# Patient Record
Sex: Female | Born: 1935 | Race: White | Hispanic: No | Marital: Married | Smoking: Former smoker
Health system: Southern US, Community
[De-identification: ages and names within clinical notes are randomized; demographics above are authoritative.]

## PROBLEM LIST (undated history)

## (undated) DIAGNOSIS — I10 Essential (primary) hypertension: Secondary | ICD-10-CM

## (undated) DIAGNOSIS — D649 Anemia, unspecified: Secondary | ICD-10-CM

## (undated) DIAGNOSIS — F0390 Unspecified dementia without behavioral disturbance: Secondary | ICD-10-CM

## (undated) DIAGNOSIS — E785 Hyperlipidemia, unspecified: Secondary | ICD-10-CM

## (undated) DIAGNOSIS — O2230 Deep phlebothrombosis in pregnancy, unspecified trimester: Secondary | ICD-10-CM

## (undated) HISTORY — DX: Anemia, unspecified: D64.9

## (undated) HISTORY — DX: Essential (primary) hypertension: I10

## (undated) HISTORY — DX: Hyperlipidemia, unspecified: E78.5

## (undated) HISTORY — PX: COLONOSCOPY: SHX174

---

## 1979-02-16 HISTORY — PX: ANKLE SURGERY: SHX546

## 1985-02-15 HISTORY — PX: BACK SURGERY: SHX140

## 2003-12-24 ENCOUNTER — Ambulatory Visit: Payer: Self-pay | Admitting: Family Medicine

## 2004-12-28 ENCOUNTER — Ambulatory Visit: Payer: Self-pay | Admitting: Family Medicine

## 2005-03-26 ENCOUNTER — Ambulatory Visit: Payer: Self-pay | Admitting: Unknown Physician Specialty

## 2005-07-29 ENCOUNTER — Ambulatory Visit: Payer: Self-pay | Admitting: Family Medicine

## 2005-08-05 ENCOUNTER — Ambulatory Visit: Payer: Self-pay | Admitting: Family Medicine

## 2005-08-17 ENCOUNTER — Ambulatory Visit: Payer: Self-pay | Admitting: Surgery

## 2006-03-01 ENCOUNTER — Ambulatory Visit: Payer: Self-pay | Admitting: Surgery

## 2006-08-22 ENCOUNTER — Ambulatory Visit: Payer: Self-pay | Admitting: Unknown Physician Specialty

## 2006-08-25 ENCOUNTER — Ambulatory Visit: Payer: Self-pay

## 2007-05-20 ENCOUNTER — Emergency Department: Payer: Self-pay | Admitting: Unknown Physician Specialty

## 2007-05-31 ENCOUNTER — Ambulatory Visit: Payer: Self-pay | Admitting: Family Medicine

## 2007-07-10 ENCOUNTER — Ambulatory Visit: Payer: Self-pay | Admitting: Family Medicine

## 2007-07-26 ENCOUNTER — Ambulatory Visit: Payer: Self-pay | Admitting: Gastroenterology

## 2007-07-28 ENCOUNTER — Ambulatory Visit: Payer: Self-pay

## 2008-06-04 ENCOUNTER — Ambulatory Visit: Payer: Self-pay | Admitting: Family Medicine

## 2008-07-09 ENCOUNTER — Ambulatory Visit: Payer: Self-pay | Admitting: Unknown Physician Specialty

## 2009-06-18 ENCOUNTER — Ambulatory Visit: Payer: Self-pay | Admitting: Ophthalmology

## 2009-06-30 ENCOUNTER — Ambulatory Visit: Payer: Self-pay | Admitting: Unknown Physician Specialty

## 2010-10-31 ENCOUNTER — Ambulatory Visit: Payer: Self-pay | Admitting: Internal Medicine

## 2011-03-08 DIAGNOSIS — D510 Vitamin B12 deficiency anemia due to intrinsic factor deficiency: Secondary | ICD-10-CM | POA: Diagnosis not present

## 2011-04-09 DIAGNOSIS — D510 Vitamin B12 deficiency anemia due to intrinsic factor deficiency: Secondary | ICD-10-CM | POA: Diagnosis not present

## 2011-04-26 DIAGNOSIS — H2510 Age-related nuclear cataract, unspecified eye: Secondary | ICD-10-CM | POA: Diagnosis not present

## 2011-04-26 DIAGNOSIS — Z961 Presence of intraocular lens: Secondary | ICD-10-CM | POA: Diagnosis not present

## 2011-05-10 DIAGNOSIS — D510 Vitamin B12 deficiency anemia due to intrinsic factor deficiency: Secondary | ICD-10-CM | POA: Diagnosis not present

## 2011-06-10 DIAGNOSIS — D510 Vitamin B12 deficiency anemia due to intrinsic factor deficiency: Secondary | ICD-10-CM | POA: Diagnosis not present

## 2011-07-08 DIAGNOSIS — R7309 Other abnormal glucose: Secondary | ICD-10-CM | POA: Diagnosis not present

## 2011-07-08 DIAGNOSIS — E785 Hyperlipidemia, unspecified: Secondary | ICD-10-CM | POA: Diagnosis not present

## 2011-07-08 DIAGNOSIS — I10 Essential (primary) hypertension: Secondary | ICD-10-CM | POA: Diagnosis not present

## 2011-07-08 DIAGNOSIS — D510 Vitamin B12 deficiency anemia due to intrinsic factor deficiency: Secondary | ICD-10-CM | POA: Diagnosis not present

## 2011-07-09 DIAGNOSIS — D649 Anemia, unspecified: Secondary | ICD-10-CM | POA: Diagnosis not present

## 2011-07-09 DIAGNOSIS — M810 Age-related osteoporosis without current pathological fracture: Secondary | ICD-10-CM | POA: Diagnosis not present

## 2011-07-09 DIAGNOSIS — D509 Iron deficiency anemia, unspecified: Secondary | ICD-10-CM | POA: Diagnosis not present

## 2011-07-09 DIAGNOSIS — E538 Deficiency of other specified B group vitamins: Secondary | ICD-10-CM | POA: Diagnosis not present

## 2011-07-09 DIAGNOSIS — R7309 Other abnormal glucose: Secondary | ICD-10-CM | POA: Diagnosis not present

## 2011-07-09 DIAGNOSIS — E785 Hyperlipidemia, unspecified: Secondary | ICD-10-CM | POA: Diagnosis not present

## 2011-08-11 DIAGNOSIS — D510 Vitamin B12 deficiency anemia due to intrinsic factor deficiency: Secondary | ICD-10-CM | POA: Diagnosis not present

## 2011-08-11 DIAGNOSIS — E785 Hyperlipidemia, unspecified: Secondary | ICD-10-CM | POA: Diagnosis not present

## 2011-08-11 DIAGNOSIS — R7309 Other abnormal glucose: Secondary | ICD-10-CM | POA: Diagnosis not present

## 2011-08-11 DIAGNOSIS — E538 Deficiency of other specified B group vitamins: Secondary | ICD-10-CM | POA: Diagnosis not present

## 2011-09-08 DIAGNOSIS — R7309 Other abnormal glucose: Secondary | ICD-10-CM | POA: Diagnosis not present

## 2011-09-08 DIAGNOSIS — D510 Vitamin B12 deficiency anemia due to intrinsic factor deficiency: Secondary | ICD-10-CM | POA: Diagnosis not present

## 2011-09-08 DIAGNOSIS — E538 Deficiency of other specified B group vitamins: Secondary | ICD-10-CM | POA: Diagnosis not present

## 2011-09-08 DIAGNOSIS — E785 Hyperlipidemia, unspecified: Secondary | ICD-10-CM | POA: Diagnosis not present

## 2011-09-24 DIAGNOSIS — R079 Chest pain, unspecified: Secondary | ICD-10-CM | POA: Diagnosis not present

## 2011-09-24 DIAGNOSIS — I10 Essential (primary) hypertension: Secondary | ICD-10-CM | POA: Diagnosis not present

## 2011-10-14 DIAGNOSIS — E538 Deficiency of other specified B group vitamins: Secondary | ICD-10-CM | POA: Diagnosis not present

## 2011-10-14 DIAGNOSIS — D510 Vitamin B12 deficiency anemia due to intrinsic factor deficiency: Secondary | ICD-10-CM | POA: Diagnosis not present

## 2011-10-14 DIAGNOSIS — E785 Hyperlipidemia, unspecified: Secondary | ICD-10-CM | POA: Diagnosis not present

## 2011-10-14 DIAGNOSIS — R7309 Other abnormal glucose: Secondary | ICD-10-CM | POA: Diagnosis not present

## 2011-11-10 DIAGNOSIS — E785 Hyperlipidemia, unspecified: Secondary | ICD-10-CM | POA: Diagnosis not present

## 2011-11-10 DIAGNOSIS — R7309 Other abnormal glucose: Secondary | ICD-10-CM | POA: Diagnosis not present

## 2011-11-10 DIAGNOSIS — Z23 Encounter for immunization: Secondary | ICD-10-CM | POA: Diagnosis not present

## 2011-11-10 DIAGNOSIS — E538 Deficiency of other specified B group vitamins: Secondary | ICD-10-CM | POA: Diagnosis not present

## 2011-11-10 DIAGNOSIS — D510 Vitamin B12 deficiency anemia due to intrinsic factor deficiency: Secondary | ICD-10-CM | POA: Diagnosis not present

## 2011-11-24 DIAGNOSIS — M779 Enthesopathy, unspecified: Secondary | ICD-10-CM | POA: Diagnosis not present

## 2011-12-09 DIAGNOSIS — D510 Vitamin B12 deficiency anemia due to intrinsic factor deficiency: Secondary | ICD-10-CM | POA: Diagnosis not present

## 2011-12-09 DIAGNOSIS — E538 Deficiency of other specified B group vitamins: Secondary | ICD-10-CM | POA: Diagnosis not present

## 2011-12-09 DIAGNOSIS — Z23 Encounter for immunization: Secondary | ICD-10-CM | POA: Diagnosis not present

## 2011-12-22 DIAGNOSIS — I10 Essential (primary) hypertension: Secondary | ICD-10-CM | POA: Diagnosis not present

## 2011-12-22 DIAGNOSIS — Z23 Encounter for immunization: Secondary | ICD-10-CM | POA: Diagnosis not present

## 2011-12-22 DIAGNOSIS — R7309 Other abnormal glucose: Secondary | ICD-10-CM | POA: Diagnosis not present

## 2011-12-22 DIAGNOSIS — M109 Gout, unspecified: Secondary | ICD-10-CM | POA: Diagnosis not present

## 2012-01-06 DIAGNOSIS — Z23 Encounter for immunization: Secondary | ICD-10-CM | POA: Diagnosis not present

## 2012-01-06 DIAGNOSIS — I10 Essential (primary) hypertension: Secondary | ICD-10-CM | POA: Diagnosis not present

## 2012-01-06 DIAGNOSIS — D518 Other vitamin B12 deficiency anemias: Secondary | ICD-10-CM | POA: Diagnosis not present

## 2012-01-06 DIAGNOSIS — M109 Gout, unspecified: Secondary | ICD-10-CM | POA: Diagnosis not present

## 2012-02-03 DIAGNOSIS — Z23 Encounter for immunization: Secondary | ICD-10-CM | POA: Diagnosis not present

## 2012-02-03 DIAGNOSIS — D518 Other vitamin B12 deficiency anemias: Secondary | ICD-10-CM | POA: Diagnosis not present

## 2012-02-03 DIAGNOSIS — M109 Gout, unspecified: Secondary | ICD-10-CM | POA: Diagnosis not present

## 2012-02-03 DIAGNOSIS — I10 Essential (primary) hypertension: Secondary | ICD-10-CM | POA: Diagnosis not present

## 2012-03-02 DIAGNOSIS — I10 Essential (primary) hypertension: Secondary | ICD-10-CM | POA: Diagnosis not present

## 2012-03-02 DIAGNOSIS — Z23 Encounter for immunization: Secondary | ICD-10-CM | POA: Diagnosis not present

## 2012-03-02 DIAGNOSIS — D518 Other vitamin B12 deficiency anemias: Secondary | ICD-10-CM | POA: Diagnosis not present

## 2012-03-02 DIAGNOSIS — M109 Gout, unspecified: Secondary | ICD-10-CM | POA: Diagnosis not present

## 2012-03-28 DIAGNOSIS — D518 Other vitamin B12 deficiency anemias: Secondary | ICD-10-CM | POA: Diagnosis not present

## 2012-03-28 DIAGNOSIS — M109 Gout, unspecified: Secondary | ICD-10-CM | POA: Diagnosis not present

## 2012-03-28 DIAGNOSIS — Z23 Encounter for immunization: Secondary | ICD-10-CM | POA: Diagnosis not present

## 2012-03-28 DIAGNOSIS — I10 Essential (primary) hypertension: Secondary | ICD-10-CM | POA: Diagnosis not present

## 2012-04-01 DIAGNOSIS — M109 Gout, unspecified: Secondary | ICD-10-CM | POA: Diagnosis not present

## 2012-04-01 DIAGNOSIS — Z23 Encounter for immunization: Secondary | ICD-10-CM | POA: Diagnosis not present

## 2012-04-01 DIAGNOSIS — I10 Essential (primary) hypertension: Secondary | ICD-10-CM | POA: Diagnosis not present

## 2012-04-01 DIAGNOSIS — J209 Acute bronchitis, unspecified: Secondary | ICD-10-CM | POA: Diagnosis not present

## 2012-04-11 ENCOUNTER — Ambulatory Visit: Payer: Self-pay | Admitting: Family Medicine

## 2012-04-11 DIAGNOSIS — J40 Bronchitis, not specified as acute or chronic: Secondary | ICD-10-CM | POA: Diagnosis not present

## 2012-04-11 DIAGNOSIS — Z23 Encounter for immunization: Secondary | ICD-10-CM | POA: Diagnosis not present

## 2012-04-11 DIAGNOSIS — R911 Solitary pulmonary nodule: Secondary | ICD-10-CM | POA: Diagnosis not present

## 2012-04-11 DIAGNOSIS — R059 Cough, unspecified: Secondary | ICD-10-CM | POA: Diagnosis not present

## 2012-04-11 DIAGNOSIS — R071 Chest pain on breathing: Secondary | ICD-10-CM | POA: Diagnosis not present

## 2012-04-11 DIAGNOSIS — I10 Essential (primary) hypertension: Secondary | ICD-10-CM | POA: Diagnosis not present

## 2012-04-14 DIAGNOSIS — D649 Anemia, unspecified: Secondary | ICD-10-CM | POA: Diagnosis not present

## 2012-04-14 DIAGNOSIS — D72829 Elevated white blood cell count, unspecified: Secondary | ICD-10-CM | POA: Diagnosis not present

## 2012-04-14 DIAGNOSIS — Z1212 Encounter for screening for malignant neoplasm of rectum: Secondary | ICD-10-CM | POA: Diagnosis not present

## 2012-04-14 DIAGNOSIS — I10 Essential (primary) hypertension: Secondary | ICD-10-CM | POA: Diagnosis not present

## 2012-04-14 DIAGNOSIS — Z23 Encounter for immunization: Secondary | ICD-10-CM | POA: Diagnosis not present

## 2012-04-19 DIAGNOSIS — I10 Essential (primary) hypertension: Secondary | ICD-10-CM | POA: Diagnosis not present

## 2012-04-25 DIAGNOSIS — D72829 Elevated white blood cell count, unspecified: Secondary | ICD-10-CM | POA: Diagnosis not present

## 2012-04-25 DIAGNOSIS — I10 Essential (primary) hypertension: Secondary | ICD-10-CM | POA: Diagnosis not present

## 2012-04-25 DIAGNOSIS — D518 Other vitamin B12 deficiency anemias: Secondary | ICD-10-CM | POA: Diagnosis not present

## 2012-04-25 DIAGNOSIS — Z1212 Encounter for screening for malignant neoplasm of rectum: Secondary | ICD-10-CM | POA: Diagnosis not present

## 2012-04-26 DIAGNOSIS — R5381 Other malaise: Secondary | ICD-10-CM | POA: Diagnosis not present

## 2012-04-26 DIAGNOSIS — D518 Other vitamin B12 deficiency anemias: Secondary | ICD-10-CM | POA: Diagnosis not present

## 2012-04-26 DIAGNOSIS — D649 Anemia, unspecified: Secondary | ICD-10-CM | POA: Diagnosis not present

## 2012-04-26 DIAGNOSIS — R5383 Other fatigue: Secondary | ICD-10-CM | POA: Diagnosis not present

## 2012-04-26 DIAGNOSIS — R0602 Shortness of breath: Secondary | ICD-10-CM | POA: Diagnosis not present

## 2012-04-26 DIAGNOSIS — I10 Essential (primary) hypertension: Secondary | ICD-10-CM | POA: Diagnosis not present

## 2012-05-05 DIAGNOSIS — I10 Essential (primary) hypertension: Secondary | ICD-10-CM | POA: Diagnosis not present

## 2012-05-05 DIAGNOSIS — R5381 Other malaise: Secondary | ICD-10-CM | POA: Diagnosis not present

## 2012-05-05 DIAGNOSIS — D649 Anemia, unspecified: Secondary | ICD-10-CM | POA: Diagnosis not present

## 2012-05-05 DIAGNOSIS — D518 Other vitamin B12 deficiency anemias: Secondary | ICD-10-CM | POA: Diagnosis not present

## 2012-05-23 DIAGNOSIS — I10 Essential (primary) hypertension: Secondary | ICD-10-CM | POA: Diagnosis not present

## 2012-05-23 DIAGNOSIS — D518 Other vitamin B12 deficiency anemias: Secondary | ICD-10-CM | POA: Diagnosis not present

## 2012-05-23 DIAGNOSIS — D649 Anemia, unspecified: Secondary | ICD-10-CM | POA: Diagnosis not present

## 2012-05-23 DIAGNOSIS — R5383 Other fatigue: Secondary | ICD-10-CM | POA: Diagnosis not present

## 2012-06-08 DIAGNOSIS — H3530 Unspecified macular degeneration: Secondary | ICD-10-CM | POA: Diagnosis not present

## 2012-06-20 DIAGNOSIS — D649 Anemia, unspecified: Secondary | ICD-10-CM | POA: Diagnosis not present

## 2012-06-20 DIAGNOSIS — I10 Essential (primary) hypertension: Secondary | ICD-10-CM | POA: Diagnosis not present

## 2012-06-20 DIAGNOSIS — R5381 Other malaise: Secondary | ICD-10-CM | POA: Diagnosis not present

## 2012-06-20 DIAGNOSIS — D518 Other vitamin B12 deficiency anemias: Secondary | ICD-10-CM | POA: Diagnosis not present

## 2012-07-12 DIAGNOSIS — M7750 Other enthesopathy of unspecified foot: Secondary | ICD-10-CM | POA: Diagnosis not present

## 2012-07-18 DIAGNOSIS — R5381 Other malaise: Secondary | ICD-10-CM | POA: Diagnosis not present

## 2012-07-18 DIAGNOSIS — D518 Other vitamin B12 deficiency anemias: Secondary | ICD-10-CM | POA: Diagnosis not present

## 2012-07-18 DIAGNOSIS — R5383 Other fatigue: Secondary | ICD-10-CM | POA: Diagnosis not present

## 2012-07-18 DIAGNOSIS — I10 Essential (primary) hypertension: Secondary | ICD-10-CM | POA: Diagnosis not present

## 2012-08-09 DIAGNOSIS — R5383 Other fatigue: Secondary | ICD-10-CM | POA: Diagnosis not present

## 2012-08-09 DIAGNOSIS — R5381 Other malaise: Secondary | ICD-10-CM | POA: Diagnosis not present

## 2012-08-09 DIAGNOSIS — D649 Anemia, unspecified: Secondary | ICD-10-CM | POA: Diagnosis not present

## 2012-08-09 DIAGNOSIS — E785 Hyperlipidemia, unspecified: Secondary | ICD-10-CM | POA: Diagnosis not present

## 2012-08-09 DIAGNOSIS — E7800 Pure hypercholesterolemia, unspecified: Secondary | ICD-10-CM | POA: Diagnosis not present

## 2012-08-09 DIAGNOSIS — R7309 Other abnormal glucose: Secondary | ICD-10-CM | POA: Diagnosis not present

## 2012-08-09 DIAGNOSIS — K219 Gastro-esophageal reflux disease without esophagitis: Secondary | ICD-10-CM | POA: Diagnosis not present

## 2012-09-07 DIAGNOSIS — D518 Other vitamin B12 deficiency anemias: Secondary | ICD-10-CM | POA: Diagnosis not present

## 2012-09-07 DIAGNOSIS — E7800 Pure hypercholesterolemia, unspecified: Secondary | ICD-10-CM | POA: Diagnosis not present

## 2012-09-07 DIAGNOSIS — R7309 Other abnormal glucose: Secondary | ICD-10-CM | POA: Diagnosis not present

## 2012-09-07 DIAGNOSIS — K219 Gastro-esophageal reflux disease without esophagitis: Secondary | ICD-10-CM | POA: Diagnosis not present

## 2012-09-18 DIAGNOSIS — E782 Mixed hyperlipidemia: Secondary | ICD-10-CM | POA: Diagnosis not present

## 2012-09-18 DIAGNOSIS — I2510 Atherosclerotic heart disease of native coronary artery without angina pectoris: Secondary | ICD-10-CM | POA: Diagnosis not present

## 2012-10-05 DIAGNOSIS — K219 Gastro-esophageal reflux disease without esophagitis: Secondary | ICD-10-CM | POA: Diagnosis not present

## 2012-10-05 DIAGNOSIS — R7309 Other abnormal glucose: Secondary | ICD-10-CM | POA: Diagnosis not present

## 2012-10-05 DIAGNOSIS — E538 Deficiency of other specified B group vitamins: Secondary | ICD-10-CM | POA: Diagnosis not present

## 2012-10-05 DIAGNOSIS — E7800 Pure hypercholesterolemia, unspecified: Secondary | ICD-10-CM | POA: Diagnosis not present

## 2012-11-02 DIAGNOSIS — E538 Deficiency of other specified B group vitamins: Secondary | ICD-10-CM | POA: Diagnosis not present

## 2012-11-02 DIAGNOSIS — E7800 Pure hypercholesterolemia, unspecified: Secondary | ICD-10-CM | POA: Diagnosis not present

## 2012-11-02 DIAGNOSIS — R7309 Other abnormal glucose: Secondary | ICD-10-CM | POA: Diagnosis not present

## 2012-11-02 DIAGNOSIS — K219 Gastro-esophageal reflux disease without esophagitis: Secondary | ICD-10-CM | POA: Diagnosis not present

## 2012-12-06 DIAGNOSIS — E538 Deficiency of other specified B group vitamins: Secondary | ICD-10-CM | POA: Diagnosis not present

## 2012-12-06 DIAGNOSIS — I10 Essential (primary) hypertension: Secondary | ICD-10-CM | POA: Diagnosis not present

## 2012-12-06 DIAGNOSIS — D649 Anemia, unspecified: Secondary | ICD-10-CM | POA: Diagnosis not present

## 2012-12-06 DIAGNOSIS — Z1331 Encounter for screening for depression: Secondary | ICD-10-CM | POA: Diagnosis not present

## 2012-12-06 DIAGNOSIS — Z23 Encounter for immunization: Secondary | ICD-10-CM | POA: Diagnosis not present

## 2012-12-06 DIAGNOSIS — R7309 Other abnormal glucose: Secondary | ICD-10-CM | POA: Diagnosis not present

## 2013-01-02 DIAGNOSIS — I10 Essential (primary) hypertension: Secondary | ICD-10-CM | POA: Diagnosis not present

## 2013-01-02 DIAGNOSIS — Z1331 Encounter for screening for depression: Secondary | ICD-10-CM | POA: Diagnosis not present

## 2013-01-02 DIAGNOSIS — D649 Anemia, unspecified: Secondary | ICD-10-CM | POA: Diagnosis not present

## 2013-01-02 DIAGNOSIS — Z23 Encounter for immunization: Secondary | ICD-10-CM | POA: Diagnosis not present

## 2013-01-02 DIAGNOSIS — R7309 Other abnormal glucose: Secondary | ICD-10-CM | POA: Diagnosis not present

## 2013-01-02 DIAGNOSIS — E538 Deficiency of other specified B group vitamins: Secondary | ICD-10-CM | POA: Diagnosis not present

## 2013-02-01 DIAGNOSIS — E538 Deficiency of other specified B group vitamins: Secondary | ICD-10-CM | POA: Diagnosis not present

## 2013-02-01 DIAGNOSIS — I10 Essential (primary) hypertension: Secondary | ICD-10-CM | POA: Diagnosis not present

## 2013-02-01 DIAGNOSIS — Z1331 Encounter for screening for depression: Secondary | ICD-10-CM | POA: Diagnosis not present

## 2013-02-01 DIAGNOSIS — Z23 Encounter for immunization: Secondary | ICD-10-CM | POA: Diagnosis not present

## 2013-02-01 DIAGNOSIS — D649 Anemia, unspecified: Secondary | ICD-10-CM | POA: Diagnosis not present

## 2013-02-01 DIAGNOSIS — R7309 Other abnormal glucose: Secondary | ICD-10-CM | POA: Diagnosis not present

## 2013-03-01 DIAGNOSIS — Z23 Encounter for immunization: Secondary | ICD-10-CM | POA: Diagnosis not present

## 2013-03-01 DIAGNOSIS — R7309 Other abnormal glucose: Secondary | ICD-10-CM | POA: Diagnosis not present

## 2013-03-01 DIAGNOSIS — I10 Essential (primary) hypertension: Secondary | ICD-10-CM | POA: Diagnosis not present

## 2013-03-01 DIAGNOSIS — E538 Deficiency of other specified B group vitamins: Secondary | ICD-10-CM | POA: Diagnosis not present

## 2013-03-01 DIAGNOSIS — D649 Anemia, unspecified: Secondary | ICD-10-CM | POA: Diagnosis not present

## 2013-03-01 DIAGNOSIS — Z1331 Encounter for screening for depression: Secondary | ICD-10-CM | POA: Diagnosis not present

## 2013-03-29 DIAGNOSIS — I10 Essential (primary) hypertension: Secondary | ICD-10-CM | POA: Diagnosis not present

## 2013-03-29 DIAGNOSIS — Z23 Encounter for immunization: Secondary | ICD-10-CM | POA: Diagnosis not present

## 2013-03-29 DIAGNOSIS — E538 Deficiency of other specified B group vitamins: Secondary | ICD-10-CM | POA: Diagnosis not present

## 2013-03-29 DIAGNOSIS — D649 Anemia, unspecified: Secondary | ICD-10-CM | POA: Diagnosis not present

## 2013-03-29 DIAGNOSIS — Z1331 Encounter for screening for depression: Secondary | ICD-10-CM | POA: Diagnosis not present

## 2013-03-29 DIAGNOSIS — R7309 Other abnormal glucose: Secondary | ICD-10-CM | POA: Diagnosis not present

## 2013-05-01 DIAGNOSIS — Z1331 Encounter for screening for depression: Secondary | ICD-10-CM | POA: Diagnosis not present

## 2013-05-01 DIAGNOSIS — E538 Deficiency of other specified B group vitamins: Secondary | ICD-10-CM | POA: Diagnosis not present

## 2013-05-01 DIAGNOSIS — I10 Essential (primary) hypertension: Secondary | ICD-10-CM | POA: Diagnosis not present

## 2013-05-01 DIAGNOSIS — D649 Anemia, unspecified: Secondary | ICD-10-CM | POA: Diagnosis not present

## 2013-05-01 DIAGNOSIS — Z23 Encounter for immunization: Secondary | ICD-10-CM | POA: Diagnosis not present

## 2013-05-01 DIAGNOSIS — R7309 Other abnormal glucose: Secondary | ICD-10-CM | POA: Diagnosis not present

## 2013-06-06 DIAGNOSIS — M109 Gout, unspecified: Secondary | ICD-10-CM | POA: Diagnosis not present

## 2013-06-06 DIAGNOSIS — R252 Cramp and spasm: Secondary | ICD-10-CM | POA: Diagnosis not present

## 2013-06-06 DIAGNOSIS — Z23 Encounter for immunization: Secondary | ICD-10-CM | POA: Diagnosis not present

## 2013-06-06 DIAGNOSIS — R7309 Other abnormal glucose: Secondary | ICD-10-CM | POA: Diagnosis not present

## 2013-06-06 DIAGNOSIS — E538 Deficiency of other specified B group vitamins: Secondary | ICD-10-CM | POA: Diagnosis not present

## 2013-06-06 DIAGNOSIS — D649 Anemia, unspecified: Secondary | ICD-10-CM | POA: Diagnosis not present

## 2013-06-06 DIAGNOSIS — Z1331 Encounter for screening for depression: Secondary | ICD-10-CM | POA: Diagnosis not present

## 2013-06-06 DIAGNOSIS — I10 Essential (primary) hypertension: Secondary | ICD-10-CM | POA: Diagnosis not present

## 2013-07-02 ENCOUNTER — Other Ambulatory Visit: Payer: Self-pay | Admitting: *Deleted

## 2013-07-02 ENCOUNTER — Ambulatory Visit (INDEPENDENT_AMBULATORY_CARE_PROVIDER_SITE_OTHER): Payer: Medicare Other

## 2013-07-02 ENCOUNTER — Encounter: Payer: Self-pay | Admitting: Podiatry

## 2013-07-02 ENCOUNTER — Ambulatory Visit (INDEPENDENT_AMBULATORY_CARE_PROVIDER_SITE_OTHER): Payer: Medicare Other | Admitting: Podiatry

## 2013-07-02 VITALS — BP 113/62 | HR 68 | Resp 18 | Ht 66.0 in | Wt 190.0 lb

## 2013-07-02 DIAGNOSIS — R609 Edema, unspecified: Secondary | ICD-10-CM

## 2013-07-02 DIAGNOSIS — M778 Other enthesopathies, not elsewhere classified: Secondary | ICD-10-CM

## 2013-07-02 DIAGNOSIS — M7750 Other enthesopathy of unspecified foot: Secondary | ICD-10-CM

## 2013-07-02 DIAGNOSIS — M779 Enthesopathy, unspecified: Secondary | ICD-10-CM

## 2013-07-02 NOTE — Progress Notes (Signed)
She presents today complaining of left ankle pain. States has been bothering her for the past 3 months or so but is not we'll go back to see Dr. Hyacinth MeekerMiller.  Objective: Vital signs are stable she is alert and oriented x3. 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  Assessment: Subtalar joint capsulitis arthritis left foot.  Plan: Injected today with Kenalog and local anesthetic followup with her as needed.

## 2013-07-05 DIAGNOSIS — E538 Deficiency of other specified B group vitamins: Secondary | ICD-10-CM | POA: Diagnosis not present

## 2013-07-05 DIAGNOSIS — I10 Essential (primary) hypertension: Secondary | ICD-10-CM | POA: Diagnosis not present

## 2013-07-05 DIAGNOSIS — R7309 Other abnormal glucose: Secondary | ICD-10-CM | POA: Diagnosis not present

## 2013-07-05 DIAGNOSIS — M109 Gout, unspecified: Secondary | ICD-10-CM | POA: Diagnosis not present

## 2013-07-05 DIAGNOSIS — Z23 Encounter for immunization: Secondary | ICD-10-CM | POA: Diagnosis not present

## 2013-07-05 DIAGNOSIS — Z1331 Encounter for screening for depression: Secondary | ICD-10-CM | POA: Diagnosis not present

## 2013-07-05 DIAGNOSIS — D649 Anemia, unspecified: Secondary | ICD-10-CM | POA: Diagnosis not present

## 2013-07-17 ENCOUNTER — Inpatient Hospital Stay: Payer: Self-pay | Admitting: Specialist

## 2013-07-17 DIAGNOSIS — K625 Hemorrhage of anus and rectum: Secondary | ICD-10-CM | POA: Diagnosis not present

## 2013-07-17 DIAGNOSIS — D62 Acute posthemorrhagic anemia: Secondary | ICD-10-CM | POA: Diagnosis not present

## 2013-07-17 DIAGNOSIS — K922 Gastrointestinal hemorrhage, unspecified: Secondary | ICD-10-CM | POA: Diagnosis not present

## 2013-07-17 DIAGNOSIS — J811 Chronic pulmonary edema: Secondary | ICD-10-CM | POA: Diagnosis not present

## 2013-07-17 DIAGNOSIS — D509 Iron deficiency anemia, unspecified: Secondary | ICD-10-CM | POA: Diagnosis not present

## 2013-07-17 DIAGNOSIS — N179 Acute kidney failure, unspecified: Secondary | ICD-10-CM | POA: Diagnosis not present

## 2013-07-17 DIAGNOSIS — K921 Melena: Secondary | ICD-10-CM | POA: Diagnosis not present

## 2013-07-17 DIAGNOSIS — K219 Gastro-esophageal reflux disease without esophagitis: Secondary | ICD-10-CM | POA: Diagnosis present

## 2013-07-17 DIAGNOSIS — K559 Vascular disorder of intestine, unspecified: Secondary | ICD-10-CM | POA: Diagnosis not present

## 2013-07-17 DIAGNOSIS — I10 Essential (primary) hypertension: Secondary | ICD-10-CM | POA: Diagnosis not present

## 2013-07-17 DIAGNOSIS — K648 Other hemorrhoids: Secondary | ICD-10-CM | POA: Diagnosis not present

## 2013-07-17 DIAGNOSIS — M1990 Unspecified osteoarthritis, unspecified site: Secondary | ICD-10-CM | POA: Diagnosis present

## 2013-07-17 DIAGNOSIS — R1110 Vomiting, unspecified: Secondary | ICD-10-CM | POA: Diagnosis not present

## 2013-07-17 DIAGNOSIS — N189 Chronic kidney disease, unspecified: Secondary | ICD-10-CM | POA: Diagnosis not present

## 2013-07-17 DIAGNOSIS — I129 Hypertensive chronic kidney disease with stage 1 through stage 4 chronic kidney disease, or unspecified chronic kidney disease: Secondary | ICD-10-CM | POA: Diagnosis not present

## 2013-07-17 DIAGNOSIS — K55059 Acute (reversible) ischemia of intestine, part and extent unspecified: Secondary | ICD-10-CM | POA: Diagnosis present

## 2013-07-17 DIAGNOSIS — K5900 Constipation, unspecified: Secondary | ICD-10-CM | POA: Diagnosis not present

## 2013-07-17 DIAGNOSIS — K5730 Diverticulosis of large intestine without perforation or abscess without bleeding: Secondary | ICD-10-CM | POA: Diagnosis not present

## 2013-07-17 LAB — COMPREHENSIVE METABOLIC PANEL
ALBUMIN: 3.2 g/dL — AB (ref 3.4–5.0)
ALK PHOS: 62 U/L
AST: 24 U/L (ref 15–37)
Anion Gap: 6 — ABNORMAL LOW (ref 7–16)
BUN: 47 mg/dL — ABNORMAL HIGH (ref 7–18)
Bilirubin,Total: 0.3 mg/dL (ref 0.2–1.0)
CALCIUM: 9.1 mg/dL (ref 8.5–10.1)
CREATININE: 1.61 mg/dL — AB (ref 0.60–1.30)
Chloride: 117 mmol/L — ABNORMAL HIGH (ref 98–107)
Co2: 18 mmol/L — ABNORMAL LOW (ref 21–32)
EGFR (African American): 33 — ABNORMAL LOW
EGFR (Non-African Amer.): 29 — ABNORMAL LOW
GLUCOSE: 117 mg/dL — AB (ref 65–99)
Osmolality: 295 (ref 275–301)
Potassium: 5.2 mmol/L — ABNORMAL HIGH (ref 3.5–5.1)
SGPT (ALT): 13 U/L (ref 12–78)
Sodium: 141 mmol/L (ref 136–145)
Total Protein: 6.4 g/dL (ref 6.4–8.2)

## 2013-07-17 LAB — CBC WITH DIFFERENTIAL/PLATELET
Basophil #: 0.1 10*3/uL (ref 0.0–0.1)
Basophil %: 0.4 %
EOS PCT: 0.8 %
Eosinophil #: 0.1 10*3/uL (ref 0.0–0.7)
HCT: 27.1 % — AB (ref 35.0–47.0)
HGB: 9.1 g/dL — AB (ref 12.0–16.0)
Lymphocyte #: 2.1 10*3/uL (ref 1.0–3.6)
Lymphocyte %: 17.8 %
MCH: 31 pg (ref 26.0–34.0)
MCHC: 33.7 g/dL (ref 32.0–36.0)
MCV: 92 fL (ref 80–100)
MONOS PCT: 3.9 %
Monocyte #: 0.5 x10 3/mm (ref 0.2–0.9)
NEUTROS ABS: 9.2 10*3/uL — AB (ref 1.4–6.5)
Neutrophil %: 77.1 %
PLATELETS: 293 10*3/uL (ref 150–440)
RBC: 2.95 10*6/uL — ABNORMAL LOW (ref 3.80–5.20)
RDW: 14 % (ref 11.5–14.5)
WBC: 11.9 10*3/uL — ABNORMAL HIGH (ref 3.6–11.0)

## 2013-07-17 LAB — PROTIME-INR
INR: 1.1
PROTHROMBIN TIME: 14.5 s (ref 11.5–14.7)

## 2013-07-17 LAB — HEMOGLOBIN: HGB: 7.5 g/dL — ABNORMAL LOW (ref 12.0–16.0)

## 2013-07-17 LAB — APTT: Activated PTT: 31 secs (ref 23.6–35.9)

## 2013-07-17 LAB — TROPONIN I: Troponin-I: <0.02 ng/mL

## 2013-07-18 LAB — CBC WITH DIFFERENTIAL/PLATELET
Basophil #: 0 10*3/uL (ref 0.0–0.1)
Basophil %: 0.1 %
Eosinophil #: 0 10*3/uL (ref 0.0–0.7)
Eosinophil %: 0.2 %
HCT: 30.9 % — AB (ref 35.0–47.0)
HGB: 10.5 g/dL — AB (ref 12.0–16.0)
LYMPHS ABS: 1.7 10*3/uL (ref 1.0–3.6)
LYMPHS PCT: 10.1 %
MCH: 30.4 pg (ref 26.0–34.0)
MCHC: 34.1 g/dL (ref 32.0–36.0)
MCV: 89 fL (ref 80–100)
Monocyte #: 0.8 x10 3/mm (ref 0.2–0.9)
Monocyte %: 4.9 %
NEUTROS ABS: 14 10*3/uL — AB (ref 1.4–6.5)
NEUTROS PCT: 84.7 %
PLATELETS: 185 10*3/uL (ref 150–440)
RBC: 3.46 10*6/uL — ABNORMAL LOW (ref 3.80–5.20)
RDW: 14.1 % (ref 11.5–14.5)
WBC: 16.5 10*3/uL — ABNORMAL HIGH (ref 3.6–11.0)

## 2013-07-18 LAB — PROTIME-INR
INR: 1.1
PROTHROMBIN TIME: 14 s (ref 11.5–14.7)

## 2013-07-18 LAB — APTT: Activated PTT: 28.6 secs (ref 23.6–35.9)

## 2013-07-18 LAB — BASIC METABOLIC PANEL
Anion Gap: 8 (ref 7–16)
BUN: 33 mg/dL — AB (ref 7–18)
CO2: 21 mmol/L (ref 21–32)
CREATININE: 1.09 mg/dL (ref 0.60–1.30)
Calcium, Total: 8.4 mg/dL — ABNORMAL LOW (ref 8.5–10.1)
Chloride: 115 mmol/L — ABNORMAL HIGH (ref 98–107)
EGFR (Non-African Amer.): 46 — ABNORMAL LOW
GFR CALC AF AMER: 53 — AB
Glucose: 101 mg/dL — ABNORMAL HIGH (ref 65–99)
Osmolality: 294 (ref 275–301)
Potassium: 3.8 mmol/L (ref 3.5–5.1)
Sodium: 144 mmol/L (ref 136–145)

## 2013-07-18 LAB — COMPREHENSIVE METABOLIC PANEL
ALK PHOS: 53 U/L
Albumin: 2.6 g/dL — ABNORMAL LOW (ref 3.4–5.0)
Bilirubin,Total: 0.6 mg/dL (ref 0.2–1.0)
SGOT(AST): 15 U/L (ref 15–37)
SGPT (ALT): 14 U/L (ref 12–78)
Total Protein: 5.2 g/dL — ABNORMAL LOW (ref 6.4–8.2)

## 2013-07-18 LAB — HEMOGLOBIN: HGB: 11.2 g/dL — ABNORMAL LOW (ref 12.0–16.0)

## 2013-07-18 LAB — FIBRINOGEN: FIBRINOGEN: 255 mg/dL (ref 210–470)

## 2013-07-19 LAB — CBC WITH DIFFERENTIAL/PLATELET
Basophil #: 0.1 10*3/uL (ref 0.0–0.1)
Basophil %: 0.7 %
Eosinophil #: 0.2 10*3/uL (ref 0.0–0.7)
Eosinophil %: 1.5 %
HCT: 28.8 % — ABNORMAL LOW (ref 35.0–47.0)
HGB: 9.7 g/dL — AB (ref 12.0–16.0)
Lymphocyte #: 1.9 10*3/uL (ref 1.0–3.6)
Lymphocyte %: 15.8 %
MCH: 30 pg (ref 26.0–34.0)
MCHC: 33.7 g/dL (ref 32.0–36.0)
MCV: 89 fL (ref 80–100)
MONO ABS: 0.7 x10 3/mm (ref 0.2–0.9)
MONOS PCT: 5.5 %
NEUTROS ABS: 9 10*3/uL — AB (ref 1.4–6.5)
Neutrophil %: 76.5 %
Platelet: 175 10*3/uL (ref 150–440)
RBC: 3.24 10*6/uL — AB (ref 3.80–5.20)
RDW: 14.3 % (ref 11.5–14.5)
WBC: 11.8 10*3/uL — ABNORMAL HIGH (ref 3.6–11.0)

## 2013-07-20 LAB — CBC WITH DIFFERENTIAL/PLATELET
BASOS PCT: 0.6 %
Basophil #: 0.1 10*3/uL (ref 0.0–0.1)
Eosinophil #: 0.2 10*3/uL (ref 0.0–0.7)
Eosinophil %: 1.7 %
HCT: 26.7 % — ABNORMAL LOW (ref 35.0–47.0)
HGB: 9.2 g/dL — AB (ref 12.0–16.0)
Lymphocyte #: 1.3 10*3/uL (ref 1.0–3.6)
Lymphocyte %: 11.9 %
MCH: 30.7 pg (ref 26.0–34.0)
MCHC: 34.5 g/dL (ref 32.0–36.0)
MCV: 89 fL (ref 80–100)
MONOS PCT: 5.6 %
Monocyte #: 0.6 x10 3/mm (ref 0.2–0.9)
NEUTROS ABS: 8.9 10*3/uL — AB (ref 1.4–6.5)
Neutrophil %: 80.2 %
PLATELETS: 178 10*3/uL (ref 150–440)
RBC: 3 10*6/uL — ABNORMAL LOW (ref 3.80–5.20)
RDW: 14.3 % (ref 11.5–14.5)
WBC: 11.1 10*3/uL — ABNORMAL HIGH (ref 3.6–11.0)

## 2013-07-22 ENCOUNTER — Inpatient Hospital Stay: Payer: Self-pay | Admitting: Internal Medicine

## 2013-07-22 DIAGNOSIS — D72829 Elevated white blood cell count, unspecified: Secondary | ICD-10-CM | POA: Diagnosis not present

## 2013-07-22 DIAGNOSIS — M1990 Unspecified osteoarthritis, unspecified site: Secondary | ICD-10-CM | POA: Diagnosis present

## 2013-07-22 DIAGNOSIS — R935 Abnormal findings on diagnostic imaging of other abdominal regions, including retroperitoneum: Secondary | ICD-10-CM | POA: Diagnosis not present

## 2013-07-22 DIAGNOSIS — Z9049 Acquired absence of other specified parts of digestive tract: Secondary | ICD-10-CM | POA: Diagnosis not present

## 2013-07-22 DIAGNOSIS — R000 Tachycardia, unspecified: Secondary | ICD-10-CM | POA: Diagnosis not present

## 2013-07-22 DIAGNOSIS — R0989 Other specified symptoms and signs involving the circulatory and respiratory systems: Secondary | ICD-10-CM | POA: Diagnosis not present

## 2013-07-22 DIAGNOSIS — IMO0002 Reserved for concepts with insufficient information to code with codable children: Secondary | ICD-10-CM | POA: Diagnosis not present

## 2013-07-22 DIAGNOSIS — Z8719 Personal history of other diseases of the digestive system: Secondary | ICD-10-CM | POA: Diagnosis not present

## 2013-07-22 DIAGNOSIS — D649 Anemia, unspecified: Secondary | ICD-10-CM | POA: Diagnosis not present

## 2013-07-22 DIAGNOSIS — E440 Moderate protein-calorie malnutrition: Secondary | ICD-10-CM | POA: Diagnosis not present

## 2013-07-22 DIAGNOSIS — K5669 Other intestinal obstruction: Secondary | ICD-10-CM | POA: Diagnosis not present

## 2013-07-22 DIAGNOSIS — Z7401 Bed confinement status: Secondary | ICD-10-CM | POA: Diagnosis not present

## 2013-07-22 DIAGNOSIS — K559 Vascular disorder of intestine, unspecified: Secondary | ICD-10-CM | POA: Diagnosis not present

## 2013-07-22 DIAGNOSIS — R110 Nausea: Secondary | ICD-10-CM | POA: Diagnosis not present

## 2013-07-22 DIAGNOSIS — K219 Gastro-esophageal reflux disease without esophagitis: Secondary | ICD-10-CM | POA: Diagnosis not present

## 2013-07-22 DIAGNOSIS — A419 Sepsis, unspecified organism: Secondary | ICD-10-CM | POA: Diagnosis not present

## 2013-07-22 DIAGNOSIS — K5732 Diverticulitis of large intestine without perforation or abscess without bleeding: Secondary | ICD-10-CM | POA: Diagnosis not present

## 2013-07-22 DIAGNOSIS — Z5189 Encounter for other specified aftercare: Secondary | ICD-10-CM | POA: Diagnosis not present

## 2013-07-22 DIAGNOSIS — Z8249 Family history of ischemic heart disease and other diseases of the circulatory system: Secondary | ICD-10-CM | POA: Diagnosis not present

## 2013-07-22 DIAGNOSIS — I10 Essential (primary) hypertension: Secondary | ICD-10-CM | POA: Diagnosis not present

## 2013-07-22 DIAGNOSIS — K5713 Diverticulitis of small intestine without perforation or abscess with bleeding: Secondary | ICD-10-CM | POA: Diagnosis not present

## 2013-07-22 DIAGNOSIS — Z79899 Other long term (current) drug therapy: Secondary | ICD-10-CM | POA: Diagnosis not present

## 2013-07-22 DIAGNOSIS — K929 Disease of digestive system, unspecified: Secondary | ICD-10-CM | POA: Diagnosis not present

## 2013-07-22 DIAGNOSIS — K922 Gastrointestinal hemorrhage, unspecified: Secondary | ICD-10-CM | POA: Diagnosis not present

## 2013-07-22 DIAGNOSIS — R7881 Bacteremia: Secondary | ICD-10-CM | POA: Diagnosis not present

## 2013-07-22 DIAGNOSIS — K560 Paralytic ileus: Secondary | ICD-10-CM | POA: Diagnosis not present

## 2013-07-22 DIAGNOSIS — Z48815 Encounter for surgical aftercare following surgery on the digestive system: Secondary | ICD-10-CM | POA: Diagnosis not present

## 2013-07-22 DIAGNOSIS — M6281 Muscle weakness (generalized): Secondary | ICD-10-CM | POA: Diagnosis not present

## 2013-07-22 DIAGNOSIS — N179 Acute kidney failure, unspecified: Secondary | ICD-10-CM | POA: Diagnosis not present

## 2013-07-22 DIAGNOSIS — B961 Klebsiella pneumoniae [K. pneumoniae] as the cause of diseases classified elsewhere: Secondary | ICD-10-CM | POA: Diagnosis present

## 2013-07-22 DIAGNOSIS — K639 Disease of intestine, unspecified: Secondary | ICD-10-CM | POA: Diagnosis not present

## 2013-07-22 DIAGNOSIS — I808 Phlebitis and thrombophlebitis of other sites: Secondary | ICD-10-CM | POA: Diagnosis not present

## 2013-07-22 DIAGNOSIS — N390 Urinary tract infection, site not specified: Secondary | ICD-10-CM | POA: Diagnosis not present

## 2013-07-22 DIAGNOSIS — K5731 Diverticulosis of large intestine without perforation or abscess with bleeding: Secondary | ICD-10-CM | POA: Diagnosis not present

## 2013-07-22 DIAGNOSIS — K921 Melena: Secondary | ICD-10-CM | POA: Diagnosis not present

## 2013-07-22 DIAGNOSIS — A498 Other bacterial infections of unspecified site: Secondary | ICD-10-CM | POA: Diagnosis present

## 2013-07-22 DIAGNOSIS — D62 Acute posthemorrhagic anemia: Secondary | ICD-10-CM | POA: Diagnosis present

## 2013-07-22 DIAGNOSIS — Z7982 Long term (current) use of aspirin: Secondary | ICD-10-CM | POA: Diagnosis not present

## 2013-07-22 DIAGNOSIS — K5989 Other specified functional intestinal disorders: Secondary | ICD-10-CM | POA: Diagnosis not present

## 2013-07-22 DIAGNOSIS — A0472 Enterocolitis due to Clostridium difficile, not specified as recurrent: Secondary | ICD-10-CM | POA: Diagnosis not present

## 2013-07-22 LAB — CBC WITH DIFFERENTIAL/PLATELET
Basophil #: 0 10*3/uL (ref 0.0–0.1)
Basophil %: 0.3 %
EOS PCT: 0.6 %
Eosinophil #: 0.1 10*3/uL (ref 0.0–0.7)
HCT: 24.1 % — ABNORMAL LOW (ref 35.0–47.0)
HGB: 7.7 g/dL — ABNORMAL LOW (ref 12.0–16.0)
Lymphocyte #: 1.6 10*3/uL (ref 1.0–3.6)
Lymphocyte %: 12.8 %
MCH: 29 pg (ref 26.0–34.0)
MCHC: 32 g/dL (ref 32.0–36.0)
MCV: 91 fL (ref 80–100)
MONO ABS: 0.6 x10 3/mm (ref 0.2–0.9)
MONOS PCT: 4.9 %
NEUTROS PCT: 81.4 %
Neutrophil #: 10.5 10*3/uL — ABNORMAL HIGH (ref 1.4–6.5)
Platelet: 232 10*3/uL (ref 150–440)
RBC: 2.66 10*6/uL — ABNORMAL LOW (ref 3.80–5.20)
RDW: 14.3 % (ref 11.5–14.5)
WBC: 12.9 10*3/uL — AB (ref 3.6–11.0)

## 2013-07-22 LAB — CBC
HCT: 27.9 % — ABNORMAL LOW (ref 35.0–47.0)
HGB: 9.1 g/dL — AB (ref 12.0–16.0)
MCH: 29.5 pg (ref 26.0–34.0)
MCHC: 32.6 g/dL (ref 32.0–36.0)
MCV: 90 fL (ref 80–100)
Platelet: 245 10*3/uL (ref 150–440)
RBC: 3.09 10*6/uL — AB (ref 3.80–5.20)
RDW: 14.5 % (ref 11.5–14.5)
WBC: 11 10*3/uL (ref 3.6–11.0)

## 2013-07-22 LAB — HEMOGLOBIN
HGB: 6.6 g/dL — AB (ref 12.0–16.0)
HGB: 7.3 g/dL — ABNORMAL LOW (ref 12.0–16.0)
HGB: 8 g/dL — ABNORMAL LOW (ref 12.0–16.0)

## 2013-07-22 LAB — COMPREHENSIVE METABOLIC PANEL
ANION GAP: 7 (ref 7–16)
AST: 25 U/L (ref 15–37)
Albumin: 2.7 g/dL — ABNORMAL LOW (ref 3.4–5.0)
Alkaline Phosphatase: 49 U/L
BUN: 25 mg/dL — ABNORMAL HIGH (ref 7–18)
Bilirubin,Total: 0.3 mg/dL (ref 0.2–1.0)
CHLORIDE: 116 mmol/L — AB (ref 98–107)
CO2: 18 mmol/L — AB (ref 21–32)
CREATININE: 1.32 mg/dL — AB (ref 0.60–1.30)
Calcium, Total: 8.9 mg/dL (ref 8.5–10.1)
EGFR (African American): 42 — ABNORMAL LOW
GFR CALC NON AF AMER: 36 — AB
Glucose: 136 mg/dL — ABNORMAL HIGH (ref 65–99)
OSMOLALITY: 288 (ref 275–301)
Potassium: 3.7 mmol/L (ref 3.5–5.1)
SGPT (ALT): 14 U/L (ref 12–78)
SODIUM: 141 mmol/L (ref 136–145)
TOTAL PROTEIN: 5.8 g/dL — AB (ref 6.4–8.2)

## 2013-07-22 LAB — PROTIME-INR
INR: 1.1
Prothrombin Time: 13.9 secs (ref 11.5–14.7)

## 2013-07-22 LAB — OCCULT BLOOD X 1 CARD TO LAB, STOOL: OCCULT BLOOD, FECES: POSITIVE

## 2013-07-22 LAB — APTT: ACTIVATED PTT: 31.9 s (ref 23.6–35.9)

## 2013-07-23 LAB — COMPREHENSIVE METABOLIC PANEL
ALBUMIN: 2.1 g/dL — AB (ref 3.4–5.0)
Alkaline Phosphatase: 37 U/L — ABNORMAL LOW
Anion Gap: 7 (ref 7–16)
BUN: 25 mg/dL — AB (ref 7–18)
Bilirubin,Total: 0.4 mg/dL (ref 0.2–1.0)
CALCIUM: 7.9 mg/dL — AB (ref 8.5–10.1)
Chloride: 121 mmol/L — ABNORMAL HIGH (ref 98–107)
Co2: 17 mmol/L — ABNORMAL LOW (ref 21–32)
Creatinine: 1.2 mg/dL (ref 0.60–1.30)
EGFR (African American): 47 — ABNORMAL LOW
EGFR (Non-African Amer.): 41 — ABNORMAL LOW
Glucose: 129 mg/dL — ABNORMAL HIGH (ref 65–99)
Osmolality: 295 (ref 275–301)
POTASSIUM: 4.2 mmol/L (ref 3.5–5.1)
SGOT(AST): 10 U/L — ABNORMAL LOW (ref 15–37)
SGPT (ALT): 12 U/L (ref 12–78)
Sodium: 145 mmol/L (ref 136–145)
Total Protein: 4.1 g/dL — ABNORMAL LOW (ref 6.4–8.2)

## 2013-07-23 LAB — CBC WITH DIFFERENTIAL/PLATELET
Basophil #: 0.1 10*3/uL (ref 0.0–0.1)
Basophil %: 0.6 %
Eosinophil #: 0.2 10*3/uL (ref 0.0–0.7)
Eosinophil %: 1.8 %
HCT: 21.8 % — ABNORMAL LOW (ref 35.0–47.0)
HGB: 7.2 g/dL — AB (ref 12.0–16.0)
LYMPHS PCT: 21.6 %
Lymphocyte #: 2.1 10*3/uL (ref 1.0–3.6)
MCH: 28.9 pg (ref 26.0–34.0)
MCHC: 33 g/dL (ref 32.0–36.0)
MCV: 88 fL (ref 80–100)
MONO ABS: 0.7 x10 3/mm (ref 0.2–0.9)
Monocyte %: 6.7 %
Neutrophil #: 6.8 10*3/uL — ABNORMAL HIGH (ref 1.4–6.5)
Neutrophil %: 69.3 %
Platelet: 185 10*3/uL (ref 150–440)
RBC: 2.49 10*6/uL — ABNORMAL LOW (ref 3.80–5.20)
RDW: 15.7 % — AB (ref 11.5–14.5)
WBC: 9.8 10*3/uL (ref 3.6–11.0)

## 2013-07-23 LAB — HEMOGLOBIN: HGB: 7.9 g/dL — AB (ref 12.0–16.0)

## 2013-07-24 LAB — CBC WITH DIFFERENTIAL/PLATELET
BASOS ABS: 0 10*3/uL (ref 0.0–0.1)
BASOS PCT: 0.5 %
EOS ABS: 0.3 10*3/uL (ref 0.0–0.7)
Eosinophil %: 3.2 %
HCT: 21 % — AB (ref 35.0–47.0)
HGB: 6.9 g/dL — ABNORMAL LOW (ref 12.0–16.0)
Lymphocyte #: 1.5 10*3/uL (ref 1.0–3.6)
Lymphocyte %: 17.9 %
MCH: 28.9 pg (ref 26.0–34.0)
MCHC: 32.9 g/dL (ref 32.0–36.0)
MCV: 88 fL (ref 80–100)
MONOS PCT: 6.7 %
Monocyte #: 0.6 x10 3/mm (ref 0.2–0.9)
NEUTROS ABS: 5.9 10*3/uL (ref 1.4–6.5)
NEUTROS PCT: 71.7 %
PLATELETS: 183 10*3/uL (ref 150–440)
RBC: 2.39 10*6/uL — ABNORMAL LOW (ref 3.80–5.20)
RDW: 15.6 % — ABNORMAL HIGH (ref 11.5–14.5)
WBC: 8.3 10*3/uL (ref 3.6–11.0)

## 2013-07-24 LAB — HEMOGLOBIN: HGB: 8.4 g/dL — AB (ref 12.0–16.0)

## 2013-07-25 HISTORY — PX: HEMICOLECTOMY: SHX854

## 2013-07-25 LAB — CBC WITH DIFFERENTIAL/PLATELET
Basophil #: 0 10*3/uL (ref 0.0–0.1)
Basophil %: 0.3 %
EOS ABS: 0 10*3/uL (ref 0.0–0.7)
EOS PCT: 0.2 %
HCT: 21.4 % — AB (ref 35.0–47.0)
HGB: 7 g/dL — AB (ref 12.0–16.0)
Lymphocyte #: 1.9 10*3/uL (ref 1.0–3.6)
Lymphocyte %: 14 %
MCH: 28.5 pg (ref 26.0–34.0)
MCHC: 32.9 g/dL (ref 32.0–36.0)
MCV: 87 fL (ref 80–100)
Monocyte #: 0.7 x10 3/mm (ref 0.2–0.9)
Monocyte %: 5.4 %
NEUTROS PCT: 80.1 %
Neutrophil #: 10.7 10*3/uL — ABNORMAL HIGH (ref 1.4–6.5)
PLATELETS: 204 10*3/uL (ref 150–440)
RBC: 2.47 10*6/uL — ABNORMAL LOW (ref 3.80–5.20)
RDW: 14.3 % (ref 11.5–14.5)
WBC: 13.4 10*3/uL — AB (ref 3.6–11.0)

## 2013-07-25 LAB — HEMOGLOBIN: HGB: 7.8 g/dL — ABNORMAL LOW (ref 12.0–16.0)

## 2013-07-26 LAB — CBC WITH DIFFERENTIAL/PLATELET
BASOS PCT: 0.1 %
Basophil #: 0 10*3/uL (ref 0.0–0.1)
EOS PCT: 0 %
Eosinophil #: 0 10*3/uL (ref 0.0–0.7)
HCT: 26.9 % — AB (ref 35.0–47.0)
HGB: 9 g/dL — AB (ref 12.0–16.0)
LYMPHS ABS: 1.8 10*3/uL (ref 1.0–3.6)
Lymphocyte %: 7.8 %
MCH: 29.4 pg (ref 26.0–34.0)
MCHC: 33.6 g/dL (ref 32.0–36.0)
MCV: 88 fL (ref 80–100)
MONOS PCT: 4.3 %
Monocyte #: 1 x10 3/mm — ABNORMAL HIGH (ref 0.2–0.9)
Neutrophil #: 19.8 10*3/uL — ABNORMAL HIGH (ref 1.4–6.5)
Neutrophil %: 87.8 %
Platelet: 220 10*3/uL (ref 150–440)
RBC: 3.08 10*6/uL — ABNORMAL LOW (ref 3.80–5.20)
RDW: 15 % — ABNORMAL HIGH (ref 11.5–14.5)
WBC: 22.6 10*3/uL — ABNORMAL HIGH (ref 3.6–11.0)

## 2013-07-27 LAB — BASIC METABOLIC PANEL
ANION GAP: 7 (ref 7–16)
BUN: 14 mg/dL (ref 7–18)
CHLORIDE: 112 mmol/L — AB (ref 98–107)
CO2: 18 mmol/L — AB (ref 21–32)
Calcium, Total: 7.9 mg/dL — ABNORMAL LOW (ref 8.5–10.1)
Creatinine: 1.34 mg/dL — ABNORMAL HIGH (ref 0.60–1.30)
EGFR (African American): 41 — ABNORMAL LOW
GFR CALC NON AF AMER: 36 — AB
Glucose: 126 mg/dL — ABNORMAL HIGH (ref 65–99)
Osmolality: 276 (ref 275–301)
Potassium: 4 mmol/L (ref 3.5–5.1)
Sodium: 137 mmol/L (ref 136–145)

## 2013-07-27 LAB — CBC WITH DIFFERENTIAL/PLATELET
BASOS ABS: 0 10*3/uL (ref 0.0–0.1)
Basophil %: 0.1 %
Eosinophil #: 0.1 10*3/uL (ref 0.0–0.7)
Eosinophil %: 0.9 %
HCT: 24.3 % — ABNORMAL LOW (ref 35.0–47.0)
HGB: 8.1 g/dL — ABNORMAL LOW (ref 12.0–16.0)
Lymphocyte #: 1.5 10*3/uL (ref 1.0–3.6)
Lymphocyte %: 12.1 %
MCH: 29.7 pg (ref 26.0–34.0)
MCHC: 33.4 g/dL (ref 32.0–36.0)
MCV: 89 fL (ref 80–100)
Monocyte #: 1 x10 3/mm — ABNORMAL HIGH (ref 0.2–0.9)
Monocyte %: 8 %
NEUTROS ABS: 9.7 10*3/uL — AB (ref 1.4–6.5)
Neutrophil %: 78.9 %
PLATELETS: 193 10*3/uL (ref 150–440)
RBC: 2.73 10*6/uL — ABNORMAL LOW (ref 3.80–5.20)
RDW: 14.8 % — AB (ref 11.5–14.5)
WBC: 12.2 10*3/uL — AB (ref 3.6–11.0)

## 2013-07-27 LAB — PATHOLOGY REPORT

## 2013-07-29 LAB — CBC WITH DIFFERENTIAL/PLATELET
Basophil #: 0 10*3/uL (ref 0.0–0.1)
Basophil %: 0.2 %
Eosinophil #: 0.1 10*3/uL (ref 0.0–0.7)
Eosinophil %: 1.2 %
HCT: 26.2 % — ABNORMAL LOW (ref 35.0–47.0)
HGB: 8.8 g/dL — ABNORMAL LOW (ref 12.0–16.0)
Lymphocyte #: 1.3 10*3/uL (ref 1.0–3.6)
Lymphocyte %: 17 %
MCH: 30.1 pg (ref 26.0–34.0)
MCHC: 33.4 g/dL (ref 32.0–36.0)
MCV: 90 fL (ref 80–100)
MONO ABS: 0.6 x10 3/mm (ref 0.2–0.9)
Monocyte %: 7.8 %
Neutrophil #: 5.8 10*3/uL (ref 1.4–6.5)
Neutrophil %: 73.8 %
PLATELETS: 288 10*3/uL (ref 150–440)
RBC: 2.91 10*6/uL — AB (ref 3.80–5.20)
RDW: 15 % — ABNORMAL HIGH (ref 11.5–14.5)
WBC: 7.9 10*3/uL (ref 3.6–11.0)

## 2013-07-29 LAB — BASIC METABOLIC PANEL
Anion Gap: 6 — ABNORMAL LOW (ref 7–16)
BUN: 15 mg/dL (ref 7–18)
CREATININE: 1.02 mg/dL (ref 0.60–1.30)
Calcium, Total: 8.5 mg/dL (ref 8.5–10.1)
Chloride: 111 mmol/L — ABNORMAL HIGH (ref 98–107)
Co2: 18 mmol/L — ABNORMAL LOW (ref 21–32)
GFR CALC AF AMER: 58 — AB
GFR CALC NON AF AMER: 50 — AB
Glucose: 112 mg/dL — ABNORMAL HIGH (ref 65–99)
OSMOLALITY: 272 (ref 275–301)
Potassium: 4.3 mmol/L (ref 3.5–5.1)
SODIUM: 135 mmol/L — AB (ref 136–145)

## 2013-07-30 LAB — CBC WITH DIFFERENTIAL/PLATELET
BASOS PCT: 0.2 %
Basophil #: 0 10*3/uL (ref 0.0–0.1)
EOS ABS: 0.1 10*3/uL (ref 0.0–0.7)
Eosinophil %: 1.5 %
HCT: 22.7 % — ABNORMAL LOW (ref 35.0–47.0)
HGB: 7.8 g/dL — ABNORMAL LOW (ref 12.0–16.0)
LYMPHS PCT: 11.9 %
Lymphocyte #: 1 10*3/uL (ref 1.0–3.6)
MCH: 30.1 pg (ref 26.0–34.0)
MCHC: 34.1 g/dL (ref 32.0–36.0)
MCV: 88 fL (ref 80–100)
MONOS PCT: 7.5 %
Monocyte #: 0.6 x10 3/mm (ref 0.2–0.9)
NEUTROS PCT: 78.9 %
Neutrophil #: 6.6 10*3/uL — ABNORMAL HIGH (ref 1.4–6.5)
PLATELETS: 302 10*3/uL (ref 150–440)
RBC: 2.57 10*6/uL — ABNORMAL LOW (ref 3.80–5.20)
RDW: 15.1 % — ABNORMAL HIGH (ref 11.5–14.5)
WBC: 8.3 10*3/uL (ref 3.6–11.0)

## 2013-07-30 LAB — BASIC METABOLIC PANEL
Anion Gap: 5 — ABNORMAL LOW (ref 7–16)
BUN: 12 mg/dL (ref 7–18)
CREATININE: 1.08 mg/dL (ref 0.60–1.30)
Calcium, Total: 8.6 mg/dL (ref 8.5–10.1)
Chloride: 115 mmol/L — ABNORMAL HIGH (ref 98–107)
Co2: 17 mmol/L — ABNORMAL LOW (ref 21–32)
EGFR (Non-African Amer.): 46 — ABNORMAL LOW
GFR CALC AF AMER: 54 — AB
GLUCOSE: 103 mg/dL — AB (ref 65–99)
OSMOLALITY: 274 (ref 275–301)
POTASSIUM: 3.9 mmol/L (ref 3.5–5.1)
SODIUM: 137 mmol/L (ref 136–145)

## 2013-07-30 LAB — HEMOGLOBIN: HGB: 9 g/dL — AB (ref 12.0–16.0)

## 2013-07-31 LAB — URINALYSIS, COMPLETE
Bilirubin,UR: NEGATIVE
Glucose,UR: NEGATIVE mg/dL (ref 0–75)
Ketone: NEGATIVE
Nitrite: POSITIVE
PROTEIN: NEGATIVE
Ph: 5 (ref 4.5–8.0)
RBC,UR: 7 /HPF (ref 0–5)
Specific Gravity: 1.009 (ref 1.003–1.030)

## 2013-07-31 LAB — CBC WITH DIFFERENTIAL/PLATELET
BANDS NEUTROPHIL: 4 %
HCT: 26.6 % — AB (ref 35.0–47.0)
HGB: 9 g/dL — ABNORMAL LOW (ref 12.0–16.0)
LYMPHS PCT: 8 %
MCH: 29.8 pg (ref 26.0–34.0)
MCHC: 34 g/dL (ref 32.0–36.0)
MCV: 88 fL (ref 80–100)
Monocytes: 5 %
PLATELETS: 316 10*3/uL (ref 150–440)
RBC: 3.04 10*6/uL — ABNORMAL LOW (ref 3.80–5.20)
RDW: 14.8 % — AB (ref 11.5–14.5)
Segmented Neutrophils: 83 %
WBC: 9 10*3/uL (ref 3.6–11.0)

## 2013-08-01 LAB — CBC WITH DIFFERENTIAL/PLATELET
BASOS ABS: 0 10*3/uL (ref 0.0–0.1)
Basophil %: 0 %
Eosinophil #: 0 10*3/uL (ref 0.0–0.7)
Eosinophil %: 0 %
HCT: 22.2 % — ABNORMAL LOW (ref 35.0–47.0)
HGB: 7.1 g/dL — ABNORMAL LOW (ref 12.0–16.0)
LYMPHS ABS: 2.3 10*3/uL (ref 1.0–3.6)
Lymphocyte %: 3.9 %
MCH: 28.3 pg (ref 26.0–34.0)
MCHC: 32 g/dL (ref 32.0–36.0)
MCV: 89 fL (ref 80–100)
MONOS PCT: 2.3 %
Monocyte #: 1.4 x10 3/mm — ABNORMAL HIGH (ref 0.2–0.9)
NEUTROS ABS: 55.5 10*3/uL — AB (ref 1.4–6.5)
Neutrophil %: 93.8 %
Platelet: 295 10*3/uL (ref 150–440)
RBC: 2.51 10*6/uL — ABNORMAL LOW (ref 3.80–5.20)
RDW: 15.4 % — ABNORMAL HIGH (ref 11.5–14.5)
WBC: 59.2 10*3/uL — AB (ref 3.6–11.0)

## 2013-08-01 LAB — BASIC METABOLIC PANEL
Anion Gap: 11 (ref 7–16)
BUN: 18 mg/dL (ref 7–18)
CHLORIDE: 110 mmol/L — AB (ref 98–107)
Calcium, Total: 8.4 mg/dL — ABNORMAL LOW (ref 8.5–10.1)
Co2: 16 mmol/L — ABNORMAL LOW (ref 21–32)
Creatinine: 1.59 mg/dL — ABNORMAL HIGH (ref 0.60–1.30)
EGFR (Non-African Amer.): 29 — ABNORMAL LOW
GFR CALC AF AMER: 34 — AB
GLUCOSE: 113 mg/dL — AB (ref 65–99)
Osmolality: 277 (ref 275–301)
Potassium: 4.2 mmol/L (ref 3.5–5.1)
SODIUM: 137 mmol/L (ref 136–145)

## 2013-08-01 LAB — CLOSTRIDIUM DIFFICILE(ARMC)

## 2013-08-02 LAB — CBC WITH DIFFERENTIAL/PLATELET
BASOS ABS: 0 10*3/uL (ref 0.0–0.1)
BASOS PCT: 0.1 %
EOS PCT: 0.6 %
Eosinophil #: 0.2 10*3/uL (ref 0.0–0.7)
HCT: 23 % — ABNORMAL LOW (ref 35.0–47.0)
HGB: 7.5 g/dL — ABNORMAL LOW (ref 12.0–16.0)
Lymphocyte #: 1.5 10*3/uL (ref 1.0–3.6)
Lymphocyte %: 4.5 %
MCH: 28.6 pg (ref 26.0–34.0)
MCHC: 32.4 g/dL (ref 32.0–36.0)
MCV: 88 fL (ref 80–100)
MONO ABS: 0.6 x10 3/mm (ref 0.2–0.9)
Monocyte %: 1.8 %
NEUTROS ABS: 30.7 10*3/uL — AB (ref 1.4–6.5)
Neutrophil %: 93 %
Platelet: 273 10*3/uL (ref 150–440)
RBC: 2.61 10*6/uL — AB (ref 3.80–5.20)
RDW: 15.4 % — ABNORMAL HIGH (ref 11.5–14.5)
WBC: 33 10*3/uL — AB (ref 3.6–11.0)

## 2013-08-02 LAB — BASIC METABOLIC PANEL
Anion Gap: 10 (ref 7–16)
BUN: 17 mg/dL (ref 7–18)
Calcium, Total: 8.4 mg/dL — ABNORMAL LOW (ref 8.5–10.1)
Chloride: 111 mmol/L — ABNORMAL HIGH (ref 98–107)
Co2: 16 mmol/L — ABNORMAL LOW (ref 21–32)
Creatinine: 1.18 mg/dL (ref 0.60–1.30)
EGFR (African American): 48 — ABNORMAL LOW
EGFR (Non-African Amer.): 42 — ABNORMAL LOW
Glucose: 95 mg/dL (ref 65–99)
Osmolality: 275 (ref 275–301)
POTASSIUM: 4.2 mmol/L (ref 3.5–5.1)
Sodium: 137 mmol/L (ref 136–145)

## 2013-08-02 LAB — MAGNESIUM: MAGNESIUM: 1.5 mg/dL — AB

## 2013-08-02 LAB — URINE CULTURE

## 2013-08-03 LAB — CBC WITH DIFFERENTIAL/PLATELET
BASOS ABS: 0 10*3/uL (ref 0.0–0.1)
Basophil %: 0.2 %
EOS ABS: 0.2 10*3/uL (ref 0.0–0.7)
Eosinophil %: 1 %
HCT: 21.9 % — AB (ref 35.0–47.0)
HGB: 7.3 g/dL — AB (ref 12.0–16.0)
Lymphocyte #: 1.2 10*3/uL (ref 1.0–3.6)
Lymphocyte %: 7.2 %
MCH: 28.8 pg (ref 26.0–34.0)
MCHC: 33.2 g/dL (ref 32.0–36.0)
MCV: 87 fL (ref 80–100)
MONO ABS: 0.5 x10 3/mm (ref 0.2–0.9)
MONOS PCT: 2.9 %
Neutrophil #: 14.2 10*3/uL — ABNORMAL HIGH (ref 1.4–6.5)
Neutrophil %: 88.7 %
Platelet: 253 10*3/uL (ref 150–440)
RBC: 2.52 10*6/uL — ABNORMAL LOW (ref 3.80–5.20)
RDW: 15.6 % — AB (ref 11.5–14.5)
WBC: 16 10*3/uL — ABNORMAL HIGH (ref 3.6–11.0)

## 2013-08-03 LAB — BASIC METABOLIC PANEL
Anion Gap: 9 (ref 7–16)
BUN: 14 mg/dL (ref 7–18)
CO2: 16 mmol/L — AB (ref 21–32)
Calcium, Total: 8.2 mg/dL — ABNORMAL LOW (ref 8.5–10.1)
Chloride: 109 mmol/L — ABNORMAL HIGH (ref 98–107)
Creatinine: 1.13 mg/dL (ref 0.60–1.30)
EGFR (African American): 51 — ABNORMAL LOW
GFR CALC NON AF AMER: 44 — AB
GLUCOSE: 111 mg/dL — AB (ref 65–99)
Osmolality: 269 (ref 275–301)
POTASSIUM: 3.9 mmol/L (ref 3.5–5.1)
Sodium: 134 mmol/L — ABNORMAL LOW (ref 136–145)

## 2013-08-03 LAB — CULTURE, BLOOD (SINGLE)

## 2013-08-03 LAB — MAGNESIUM: Magnesium: 1.8 mg/dL

## 2013-08-04 LAB — CBC WITH DIFFERENTIAL/PLATELET
BASOS ABS: 0 10*3/uL (ref 0.0–0.1)
BASOS PCT: 0.2 %
EOS ABS: 0.1 10*3/uL (ref 0.0–0.7)
Eosinophil %: 0.6 %
HCT: 26.7 % — ABNORMAL LOW (ref 35.0–47.0)
HGB: 8.5 g/dL — ABNORMAL LOW (ref 12.0–16.0)
LYMPHS ABS: 1.1 10*3/uL (ref 1.0–3.6)
Lymphocyte %: 7.9 %
MCH: 28 pg (ref 26.0–34.0)
MCHC: 31.7 g/dL — ABNORMAL LOW (ref 32.0–36.0)
MCV: 88 fL (ref 80–100)
Monocyte #: 0.6 x10 3/mm (ref 0.2–0.9)
Monocyte %: 4.4 %
NEUTROS ABS: 11.8 10*3/uL — AB (ref 1.4–6.5)
Neutrophil %: 86.9 %
Platelet: 233 10*3/uL (ref 150–440)
RBC: 3.03 10*6/uL — ABNORMAL LOW (ref 3.80–5.20)
RDW: 15.7 % — ABNORMAL HIGH (ref 11.5–14.5)
WBC: 13.6 10*3/uL — ABNORMAL HIGH (ref 3.6–11.0)

## 2013-08-04 LAB — BASIC METABOLIC PANEL
ANION GAP: 9 (ref 7–16)
BUN: 12 mg/dL (ref 7–18)
CO2: 16 mmol/L — AB (ref 21–32)
CREATININE: 1.01 mg/dL (ref 0.60–1.30)
Calcium, Total: 8.7 mg/dL (ref 8.5–10.1)
Chloride: 110 mmol/L — ABNORMAL HIGH (ref 98–107)
GFR CALC AF AMER: 58 — AB
GFR CALC NON AF AMER: 50 — AB
GLUCOSE: 142 mg/dL — AB (ref 65–99)
Osmolality: 272 (ref 275–301)
POTASSIUM: 4.3 mmol/L (ref 3.5–5.1)
SODIUM: 135 mmol/L — AB (ref 136–145)

## 2013-08-04 LAB — CULTURE, BLOOD (SINGLE)

## 2013-08-05 LAB — CBC WITH DIFFERENTIAL/PLATELET
BASOS PCT: 0.2 %
Basophil #: 0 10*3/uL (ref 0.0–0.1)
Eosinophil #: 0.1 10*3/uL (ref 0.0–0.7)
Eosinophil %: 1 %
HCT: 23.7 % — AB (ref 35.0–47.0)
HGB: 7.8 g/dL — ABNORMAL LOW (ref 12.0–16.0)
Lymphocyte #: 1.4 10*3/uL (ref 1.0–3.6)
Lymphocyte %: 9.6 %
MCH: 28.7 pg (ref 26.0–34.0)
MCHC: 32.9 g/dL (ref 32.0–36.0)
MCV: 87 fL (ref 80–100)
Monocyte #: 0.8 x10 3/mm (ref 0.2–0.9)
Monocyte %: 5.6 %
Neutrophil #: 12.2 10*3/uL — ABNORMAL HIGH (ref 1.4–6.5)
Neutrophil %: 83.6 %
PLATELETS: 222 10*3/uL (ref 150–440)
RBC: 2.72 10*6/uL — ABNORMAL LOW (ref 3.80–5.20)
RDW: 16.2 % — ABNORMAL HIGH (ref 11.5–14.5)
WBC: 14.6 10*3/uL — ABNORMAL HIGH (ref 3.6–11.0)

## 2013-08-06 LAB — CBC WITH DIFFERENTIAL/PLATELET
Basophil #: 0 10*3/uL (ref 0.0–0.1)
Basophil %: 0.3 %
EOS PCT: 1 %
Eosinophil #: 0.1 10*3/uL (ref 0.0–0.7)
HCT: 23.5 % — ABNORMAL LOW (ref 35.0–47.0)
HGB: 7.8 g/dL — ABNORMAL LOW (ref 12.0–16.0)
LYMPHS PCT: 10.3 %
Lymphocyte #: 1.5 10*3/uL (ref 1.0–3.6)
MCH: 28.6 pg (ref 26.0–34.0)
MCHC: 33 g/dL (ref 32.0–36.0)
MCV: 87 fL (ref 80–100)
Monocyte #: 0.7 x10 3/mm (ref 0.2–0.9)
Monocyte %: 4.5 %
Neutrophil #: 12.2 10*3/uL — ABNORMAL HIGH (ref 1.4–6.5)
Neutrophil %: 83.9 %
PLATELETS: 239 10*3/uL (ref 150–440)
RBC: 2.72 10*6/uL — AB (ref 3.80–5.20)
RDW: 16.1 % — ABNORMAL HIGH (ref 11.5–14.5)
WBC: 14.5 10*3/uL — AB (ref 3.6–11.0)

## 2013-08-07 ENCOUNTER — Encounter: Payer: Self-pay | Admitting: Internal Medicine

## 2013-08-07 DIAGNOSIS — Z8719 Personal history of other diseases of the digestive system: Secondary | ICD-10-CM | POA: Diagnosis not present

## 2013-08-07 DIAGNOSIS — D72829 Elevated white blood cell count, unspecified: Secondary | ICD-10-CM | POA: Diagnosis not present

## 2013-08-07 DIAGNOSIS — Z48815 Encounter for surgical aftercare following surgery on the digestive system: Secondary | ICD-10-CM | POA: Diagnosis not present

## 2013-08-07 DIAGNOSIS — K929 Disease of digestive system, unspecified: Secondary | ICD-10-CM | POA: Diagnosis present

## 2013-08-07 DIAGNOSIS — M7989 Other specified soft tissue disorders: Secondary | ICD-10-CM | POA: Diagnosis not present

## 2013-08-07 DIAGNOSIS — M109 Gout, unspecified: Secondary | ICD-10-CM | POA: Diagnosis not present

## 2013-08-07 DIAGNOSIS — K560 Paralytic ileus: Secondary | ICD-10-CM | POA: Diagnosis present

## 2013-08-07 DIAGNOSIS — M1990 Unspecified osteoarthritis, unspecified site: Secondary | ICD-10-CM | POA: Diagnosis present

## 2013-08-07 DIAGNOSIS — L980 Pyogenic granuloma: Secondary | ICD-10-CM | POA: Diagnosis present

## 2013-08-07 DIAGNOSIS — K449 Diaphragmatic hernia without obstruction or gangrene: Secondary | ICD-10-CM | POA: Diagnosis present

## 2013-08-07 DIAGNOSIS — Z7401 Bed confinement status: Secondary | ICD-10-CM | POA: Diagnosis not present

## 2013-08-07 DIAGNOSIS — N179 Acute kidney failure, unspecified: Secondary | ICD-10-CM | POA: Diagnosis not present

## 2013-08-07 DIAGNOSIS — M6281 Muscle weakness (generalized): Secondary | ICD-10-CM | POA: Diagnosis not present

## 2013-08-07 DIAGNOSIS — R7881 Bacteremia: Secondary | ICD-10-CM | POA: Diagnosis not present

## 2013-08-07 DIAGNOSIS — D509 Iron deficiency anemia, unspecified: Secondary | ICD-10-CM | POA: Diagnosis present

## 2013-08-07 DIAGNOSIS — I82409 Acute embolism and thrombosis of unspecified deep veins of unspecified lower extremity: Secondary | ICD-10-CM | POA: Diagnosis not present

## 2013-08-07 DIAGNOSIS — Q762 Congenital spondylolisthesis: Secondary | ICD-10-CM | POA: Diagnosis not present

## 2013-08-07 DIAGNOSIS — K551 Chronic vascular disorders of intestine: Secondary | ICD-10-CM | POA: Diagnosis present

## 2013-08-07 DIAGNOSIS — K922 Gastrointestinal hemorrhage, unspecified: Secondary | ICD-10-CM | POA: Diagnosis not present

## 2013-08-07 DIAGNOSIS — I10 Essential (primary) hypertension: Secondary | ICD-10-CM | POA: Diagnosis not present

## 2013-08-07 DIAGNOSIS — Z5189 Encounter for other specified aftercare: Secondary | ICD-10-CM | POA: Diagnosis not present

## 2013-08-07 DIAGNOSIS — D649 Anemia, unspecified: Secondary | ICD-10-CM | POA: Diagnosis not present

## 2013-08-07 DIAGNOSIS — I824Y9 Acute embolism and thrombosis of unspecified deep veins of unspecified proximal lower extremity: Secondary | ICD-10-CM | POA: Diagnosis not present

## 2013-08-07 DIAGNOSIS — Z9049 Acquired absence of other specified parts of digestive tract: Secondary | ICD-10-CM | POA: Diagnosis not present

## 2013-08-07 DIAGNOSIS — M79609 Pain in unspecified limb: Secondary | ICD-10-CM | POA: Diagnosis not present

## 2013-08-07 DIAGNOSIS — K5732 Diverticulitis of large intestine without perforation or abscess without bleeding: Secondary | ICD-10-CM | POA: Diagnosis not present

## 2013-08-07 DIAGNOSIS — A0472 Enterocolitis due to Clostridium difficile, not specified as recurrent: Secondary | ICD-10-CM | POA: Diagnosis not present

## 2013-08-07 DIAGNOSIS — N390 Urinary tract infection, site not specified: Secondary | ICD-10-CM | POA: Diagnosis present

## 2013-08-07 DIAGNOSIS — E869 Volume depletion, unspecified: Secondary | ICD-10-CM | POA: Diagnosis present

## 2013-08-07 DIAGNOSIS — K219 Gastro-esophageal reflux disease without esophagitis: Secondary | ICD-10-CM | POA: Diagnosis present

## 2013-08-07 DIAGNOSIS — J8410 Pulmonary fibrosis, unspecified: Secondary | ICD-10-CM | POA: Diagnosis present

## 2013-08-07 DIAGNOSIS — Z86718 Personal history of other venous thrombosis and embolism: Secondary | ICD-10-CM | POA: Diagnosis not present

## 2013-08-07 LAB — CBC WITH DIFFERENTIAL/PLATELET
BASOS PCT: 0.3 %
Basophil #: 0 10*3/uL (ref 0.0–0.1)
Eosinophil #: 0.1 10*3/uL (ref 0.0–0.7)
Eosinophil %: 1.1 %
HCT: 22.7 % — ABNORMAL LOW (ref 35.0–47.0)
HGB: 7.2 g/dL — AB (ref 12.0–16.0)
LYMPHS ABS: 1.3 10*3/uL (ref 1.0–3.6)
Lymphocyte %: 11.6 %
MCH: 27.5 pg (ref 26.0–34.0)
MCHC: 31.7 g/dL — ABNORMAL LOW (ref 32.0–36.0)
MCV: 87 fL (ref 80–100)
MONOS PCT: 6 %
Monocyte #: 0.7 x10 3/mm (ref 0.2–0.9)
NEUTROS ABS: 9.2 10*3/uL — AB (ref 1.4–6.5)
Neutrophil %: 81 %
Platelet: 261 10*3/uL (ref 150–440)
RBC: 2.62 10*6/uL — ABNORMAL LOW (ref 3.80–5.20)
RDW: 16 % — AB (ref 11.5–14.5)
WBC: 11.4 10*3/uL — AB (ref 3.6–11.0)

## 2013-08-08 DIAGNOSIS — A0472 Enterocolitis due to Clostridium difficile, not specified as recurrent: Secondary | ICD-10-CM | POA: Diagnosis not present

## 2013-08-08 DIAGNOSIS — K922 Gastrointestinal hemorrhage, unspecified: Secondary | ICD-10-CM | POA: Diagnosis not present

## 2013-08-08 DIAGNOSIS — M109 Gout, unspecified: Secondary | ICD-10-CM | POA: Diagnosis not present

## 2013-08-15 ENCOUNTER — Encounter: Payer: Self-pay | Admitting: Internal Medicine

## 2013-08-27 LAB — CLOSTRIDIUM DIFFICILE(ARMC)

## 2013-08-29 ENCOUNTER — Inpatient Hospital Stay: Payer: Self-pay | Admitting: Internal Medicine

## 2013-08-29 DIAGNOSIS — K551 Chronic vascular disorders of intestine: Secondary | ICD-10-CM | POA: Diagnosis not present

## 2013-08-29 DIAGNOSIS — Z86718 Personal history of other venous thrombosis and embolism: Secondary | ICD-10-CM | POA: Diagnosis not present

## 2013-08-29 DIAGNOSIS — K560 Paralytic ileus: Secondary | ICD-10-CM | POA: Diagnosis present

## 2013-08-29 DIAGNOSIS — K449 Diaphragmatic hernia without obstruction or gangrene: Secondary | ICD-10-CM | POA: Diagnosis present

## 2013-08-29 DIAGNOSIS — Z8719 Personal history of other diseases of the digestive system: Secondary | ICD-10-CM | POA: Diagnosis not present

## 2013-08-29 DIAGNOSIS — R5381 Other malaise: Secondary | ICD-10-CM | POA: Diagnosis not present

## 2013-08-29 DIAGNOSIS — D509 Iron deficiency anemia, unspecified: Secondary | ICD-10-CM | POA: Diagnosis not present

## 2013-08-29 DIAGNOSIS — Q762 Congenital spondylolisthesis: Secondary | ICD-10-CM | POA: Diagnosis not present

## 2013-08-29 DIAGNOSIS — M1990 Unspecified osteoarthritis, unspecified site: Secondary | ICD-10-CM | POA: Diagnosis present

## 2013-08-29 DIAGNOSIS — K922 Gastrointestinal hemorrhage, unspecified: Secondary | ICD-10-CM | POA: Diagnosis not present

## 2013-08-29 DIAGNOSIS — E869 Volume depletion, unspecified: Secondary | ICD-10-CM | POA: Diagnosis present

## 2013-08-29 DIAGNOSIS — Z5189 Encounter for other specified aftercare: Secondary | ICD-10-CM | POA: Diagnosis not present

## 2013-08-29 DIAGNOSIS — N179 Acute kidney failure, unspecified: Secondary | ICD-10-CM | POA: Diagnosis not present

## 2013-08-29 DIAGNOSIS — J8410 Pulmonary fibrosis, unspecified: Secondary | ICD-10-CM | POA: Diagnosis not present

## 2013-08-29 DIAGNOSIS — N390 Urinary tract infection, site not specified: Secondary | ICD-10-CM | POA: Diagnosis not present

## 2013-08-29 DIAGNOSIS — L980 Pyogenic granuloma: Secondary | ICD-10-CM | POA: Diagnosis present

## 2013-08-29 DIAGNOSIS — K219 Gastro-esophageal reflux disease without esophagitis: Secondary | ICD-10-CM | POA: Diagnosis present

## 2013-08-29 DIAGNOSIS — M6281 Muscle weakness (generalized): Secondary | ICD-10-CM | POA: Diagnosis not present

## 2013-08-29 DIAGNOSIS — A0472 Enterocolitis due to Clostridium difficile, not specified as recurrent: Secondary | ICD-10-CM | POA: Diagnosis not present

## 2013-08-29 DIAGNOSIS — K929 Disease of digestive system, unspecified: Secondary | ICD-10-CM | POA: Diagnosis present

## 2013-08-29 DIAGNOSIS — K5732 Diverticulitis of large intestine without perforation or abscess without bleeding: Secondary | ICD-10-CM | POA: Diagnosis not present

## 2013-08-29 DIAGNOSIS — Z9049 Acquired absence of other specified parts of digestive tract: Secondary | ICD-10-CM | POA: Diagnosis not present

## 2013-08-29 DIAGNOSIS — M79609 Pain in unspecified limb: Secondary | ICD-10-CM | POA: Diagnosis not present

## 2013-08-29 DIAGNOSIS — I824Y9 Acute embolism and thrombosis of unspecified deep veins of unspecified proximal lower extremity: Secondary | ICD-10-CM | POA: Diagnosis not present

## 2013-08-29 DIAGNOSIS — M7989 Other specified soft tissue disorders: Secondary | ICD-10-CM | POA: Diagnosis not present

## 2013-08-29 DIAGNOSIS — I10 Essential (primary) hypertension: Secondary | ICD-10-CM | POA: Diagnosis not present

## 2013-08-29 DIAGNOSIS — I82409 Acute embolism and thrombosis of unspecified deep veins of unspecified lower extremity: Secondary | ICD-10-CM | POA: Diagnosis not present

## 2013-08-29 LAB — CBC
HCT: 31.1 % — AB (ref 35.0–47.0)
HGB: 9.8 g/dL — ABNORMAL LOW (ref 12.0–16.0)
MCH: 26.9 pg (ref 26.0–34.0)
MCHC: 31.5 g/dL — ABNORMAL LOW (ref 32.0–36.0)
MCV: 85 fL (ref 80–100)
PLATELETS: 275 10*3/uL (ref 150–440)
RBC: 3.65 10*6/uL — ABNORMAL LOW (ref 3.80–5.20)
RDW: 16.5 % — ABNORMAL HIGH (ref 11.5–14.5)
WBC: 14.7 10*3/uL — AB (ref 3.6–11.0)

## 2013-08-29 LAB — COMPREHENSIVE METABOLIC PANEL
ALBUMIN: 2.9 g/dL — AB (ref 3.4–5.0)
ALT: 13 U/L (ref 12–78)
ANION GAP: 11 (ref 7–16)
Alkaline Phosphatase: 64 U/L
BILIRUBIN TOTAL: 0.4 mg/dL (ref 0.2–1.0)
BUN: 29 mg/dL — AB (ref 7–18)
CALCIUM: 9.1 mg/dL (ref 8.5–10.1)
CHLORIDE: 102 mmol/L (ref 98–107)
Co2: 17 mmol/L — ABNORMAL LOW (ref 21–32)
Creatinine: 2.16 mg/dL — ABNORMAL HIGH (ref 0.60–1.30)
EGFR (Non-African Amer.): 20 — ABNORMAL LOW
GFR CALC AF AMER: 23 — AB
GLUCOSE: 123 mg/dL — AB (ref 65–99)
Osmolality: 268 (ref 275–301)
Potassium: 4.4 mmol/L (ref 3.5–5.1)
SGOT(AST): 26 U/L (ref 15–37)
SODIUM: 130 mmol/L — AB (ref 136–145)
Total Protein: 6.5 g/dL (ref 6.4–8.2)

## 2013-08-29 LAB — PROTIME-INR
INR: 1.1
Prothrombin Time: 14.2 secs (ref 11.5–14.7)

## 2013-08-29 LAB — APTT: Activated PTT: 30.3 secs (ref 23.6–35.9)

## 2013-08-30 LAB — BASIC METABOLIC PANEL
Anion Gap: 8 (ref 7–16)
BUN: 30 mg/dL — ABNORMAL HIGH (ref 7–18)
CO2: 18 mmol/L — AB (ref 21–32)
Calcium, Total: 9.2 mg/dL (ref 8.5–10.1)
Chloride: 104 mmol/L (ref 98–107)
Creatinine: 1.98 mg/dL — ABNORMAL HIGH (ref 0.60–1.30)
EGFR (African American): 26 — ABNORMAL LOW
EGFR (Non-African Amer.): 22 — ABNORMAL LOW
Glucose: 98 mg/dL (ref 65–99)
Osmolality: 267 (ref 275–301)
Potassium: 4.4 mmol/L (ref 3.5–5.1)
Sodium: 130 mmol/L — ABNORMAL LOW (ref 136–145)

## 2013-08-30 LAB — CBC WITH DIFFERENTIAL/PLATELET
Basophil #: 0.1 10*3/uL (ref 0.0–0.1)
Basophil %: 0.6 %
EOS PCT: 0.4 %
Eosinophil #: 0.1 10*3/uL (ref 0.0–0.7)
HCT: 28.3 % — AB (ref 35.0–47.0)
HGB: 8.9 g/dL — ABNORMAL LOW (ref 12.0–16.0)
Lymphocyte #: 2.5 10*3/uL (ref 1.0–3.6)
Lymphocyte %: 20 %
MCH: 26.9 pg (ref 26.0–34.0)
MCHC: 31.4 g/dL — AB (ref 32.0–36.0)
MCV: 86 fL (ref 80–100)
MONOS PCT: 7.5 %
Monocyte #: 0.9 x10 3/mm (ref 0.2–0.9)
Neutrophil #: 8.9 10*3/uL — ABNORMAL HIGH (ref 1.4–6.5)
Neutrophil %: 71.5 %
PLATELETS: 252 10*3/uL (ref 150–440)
RBC: 3.3 10*6/uL — AB (ref 3.80–5.20)
RDW: 16.7 % — ABNORMAL HIGH (ref 11.5–14.5)
WBC: 12.5 10*3/uL — AB (ref 3.6–11.0)

## 2013-08-30 LAB — APTT: ACTIVATED PTT: 129.8 s — AB (ref 23.6–35.9)

## 2013-08-30 LAB — HEPARIN LEVEL (UNFRACTIONATED): Anti-Xa(Unfractionated): 0.66 IU/mL (ref 0.30–0.70)

## 2013-08-31 LAB — PROTIME-INR
INR: 1
Prothrombin Time: 13.4 secs (ref 11.5–14.7)

## 2013-08-31 LAB — BASIC METABOLIC PANEL
Anion Gap: 6 — ABNORMAL LOW (ref 7–16)
BUN: 28 mg/dL — ABNORMAL HIGH (ref 7–18)
CO2: 17 mmol/L — AB (ref 21–32)
CREATININE: 1.47 mg/dL — AB (ref 0.60–1.30)
Calcium, Total: 8.6 mg/dL (ref 8.5–10.1)
Chloride: 110 mmol/L — ABNORMAL HIGH (ref 98–107)
EGFR (African American): 37 — ABNORMAL LOW
EGFR (Non-African Amer.): 32 — ABNORMAL LOW
Glucose: 87 mg/dL (ref 65–99)
OSMOLALITY: 271 (ref 275–301)
POTASSIUM: 4 mmol/L (ref 3.5–5.1)
SODIUM: 133 mmol/L — AB (ref 136–145)

## 2013-08-31 LAB — CBC WITH DIFFERENTIAL/PLATELET
BASOS ABS: 0 10*3/uL (ref 0.0–0.1)
Basophil %: 0.5 %
EOS PCT: 0.6 %
Eosinophil #: 0.1 10*3/uL (ref 0.0–0.7)
HCT: 25.9 % — ABNORMAL LOW (ref 35.0–47.0)
HGB: 8.4 g/dL — AB (ref 12.0–16.0)
LYMPHS PCT: 16.3 %
Lymphocyte #: 1.4 10*3/uL (ref 1.0–3.6)
MCH: 27.7 pg (ref 26.0–34.0)
MCHC: 32.6 g/dL (ref 32.0–36.0)
MCV: 85 fL (ref 80–100)
MONOS PCT: 8.4 %
Monocyte #: 0.7 x10 3/mm (ref 0.2–0.9)
NEUTROS ABS: 6.5 10*3/uL (ref 1.4–6.5)
Neutrophil %: 74.2 %
Platelet: 264 10*3/uL (ref 150–440)
RBC: 3.05 10*6/uL — AB (ref 3.80–5.20)
RDW: 16.6 % — ABNORMAL HIGH (ref 11.5–14.5)
WBC: 8.8 10*3/uL (ref 3.6–11.0)

## 2013-08-31 LAB — HEPARIN LEVEL (UNFRACTIONATED)
ANTI-XA(UNFRACTIONATED): 0.71 [IU]/mL — AB (ref 0.30–0.70)
Anti-Xa(Unfractionated): 0.15 IU/mL — ABNORMAL LOW (ref 0.30–0.70)
Anti-Xa(Unfractionated): 0.54 IU/mL (ref 0.30–0.70)

## 2013-08-31 LAB — OCCULT BLOOD X 1 CARD TO LAB, STOOL: Occult Blood, Feces: POSITIVE

## 2013-09-01 HISTORY — PX: IVC FILTER PLACEMENT (ARMC HX): HXRAD1551

## 2013-09-01 LAB — CBC WITH DIFFERENTIAL/PLATELET
Basophil #: 0 10*3/uL (ref 0.0–0.1)
Basophil %: 0.6 %
EOS PCT: 1.4 %
Eosinophil #: 0.1 10*3/uL (ref 0.0–0.7)
HCT: 25.7 % — AB (ref 35.0–47.0)
HGB: 8.2 g/dL — ABNORMAL LOW (ref 12.0–16.0)
Lymphocyte #: 1.7 10*3/uL (ref 1.0–3.6)
Lymphocyte %: 23.9 %
MCH: 27.3 pg (ref 26.0–34.0)
MCHC: 31.8 g/dL — ABNORMAL LOW (ref 32.0–36.0)
MCV: 86 fL (ref 80–100)
Monocyte #: 0.6 x10 3/mm (ref 0.2–0.9)
Monocyte %: 8.4 %
NEUTROS PCT: 65.7 %
Neutrophil #: 4.8 10*3/uL (ref 1.4–6.5)
Platelet: 294 10*3/uL (ref 150–440)
RBC: 2.98 10*6/uL — ABNORMAL LOW (ref 3.80–5.20)
RDW: 16.7 % — ABNORMAL HIGH (ref 11.5–14.5)
WBC: 7.3 10*3/uL (ref 3.6–11.0)

## 2013-09-01 LAB — PROTIME-INR
INR: 1.1
Prothrombin Time: 14 secs (ref 11.5–14.7)

## 2013-09-01 LAB — HEPARIN LEVEL (UNFRACTIONATED): Anti-Xa(Unfractionated): 0.38 IU/mL (ref 0.30–0.70)

## 2013-09-02 DIAGNOSIS — Z5189 Encounter for other specified aftercare: Secondary | ICD-10-CM | POA: Diagnosis not present

## 2013-09-02 DIAGNOSIS — D509 Iron deficiency anemia, unspecified: Secondary | ICD-10-CM | POA: Diagnosis not present

## 2013-09-02 DIAGNOSIS — I10 Essential (primary) hypertension: Secondary | ICD-10-CM | POA: Diagnosis not present

## 2013-09-02 DIAGNOSIS — A0472 Enterocolitis due to Clostridium difficile, not specified as recurrent: Secondary | ICD-10-CM | POA: Diagnosis not present

## 2013-09-02 DIAGNOSIS — I82409 Acute embolism and thrombosis of unspecified deep veins of unspecified lower extremity: Secondary | ICD-10-CM | POA: Diagnosis not present

## 2013-09-02 DIAGNOSIS — Z9049 Acquired absence of other specified parts of digestive tract: Secondary | ICD-10-CM | POA: Diagnosis not present

## 2013-09-02 DIAGNOSIS — K5732 Diverticulitis of large intestine without perforation or abscess without bleeding: Secondary | ICD-10-CM | POA: Diagnosis not present

## 2013-09-02 DIAGNOSIS — M6281 Muscle weakness (generalized): Secondary | ICD-10-CM | POA: Diagnosis not present

## 2013-09-02 DIAGNOSIS — K551 Chronic vascular disorders of intestine: Secondary | ICD-10-CM | POA: Diagnosis not present

## 2013-09-02 DIAGNOSIS — R5381 Other malaise: Secondary | ICD-10-CM | POA: Diagnosis not present

## 2013-09-02 DIAGNOSIS — K922 Gastrointestinal hemorrhage, unspecified: Secondary | ICD-10-CM | POA: Diagnosis not present

## 2013-09-02 DIAGNOSIS — Z8719 Personal history of other diseases of the digestive system: Secondary | ICD-10-CM | POA: Diagnosis not present

## 2013-09-02 DIAGNOSIS — N179 Acute kidney failure, unspecified: Secondary | ICD-10-CM | POA: Diagnosis not present

## 2013-09-02 LAB — CBC WITH DIFFERENTIAL/PLATELET
BASOS ABS: 0 10*3/uL (ref 0.0–0.1)
Basophil %: 0.7 %
EOS ABS: 0.2 10*3/uL (ref 0.0–0.7)
EOS PCT: 2.3 %
HCT: 25.8 % — AB (ref 35.0–47.0)
HGB: 8.5 g/dL — AB (ref 12.0–16.0)
LYMPHS ABS: 1.6 10*3/uL (ref 1.0–3.6)
LYMPHS PCT: 23.5 %
MCH: 28.4 pg (ref 26.0–34.0)
MCHC: 33.1 g/dL (ref 32.0–36.0)
MCV: 86 fL (ref 80–100)
Monocyte #: 0.6 x10 3/mm (ref 0.2–0.9)
Monocyte %: 8.6 %
NEUTROS ABS: 4.4 10*3/uL (ref 1.4–6.5)
Neutrophil %: 64.9 %
PLATELETS: 322 10*3/uL (ref 150–440)
RBC: 3.01 10*6/uL — AB (ref 3.80–5.20)
RDW: 16.7 % — ABNORMAL HIGH (ref 11.5–14.5)
WBC: 6.8 10*3/uL (ref 3.6–11.0)

## 2013-09-02 LAB — HEPARIN LEVEL (UNFRACTIONATED): Anti-Xa(Unfractionated): 0.32 IU/mL (ref 0.30–0.70)

## 2013-09-02 LAB — BASIC METABOLIC PANEL
Anion Gap: 9 (ref 7–16)
BUN: 15 mg/dL (ref 7–18)
Calcium, Total: 8.6 mg/dL (ref 8.5–10.1)
Chloride: 112 mmol/L — ABNORMAL HIGH (ref 98–107)
Co2: 18 mmol/L — ABNORMAL LOW (ref 21–32)
Creatinine: 1.04 mg/dL (ref 0.60–1.30)
EGFR (African American): 56 — ABNORMAL LOW
EGFR (Non-African Amer.): 49 — ABNORMAL LOW
Glucose: 92 mg/dL (ref 65–99)
Osmolality: 278 (ref 275–301)
POTASSIUM: 4.4 mmol/L (ref 3.5–5.1)
Sodium: 139 mmol/L (ref 136–145)

## 2013-09-02 LAB — PROTIME-INR
INR: 1
PROTHROMBIN TIME: 12.9 s (ref 11.5–14.7)

## 2013-09-03 DIAGNOSIS — I82409 Acute embolism and thrombosis of unspecified deep veins of unspecified lower extremity: Secondary | ICD-10-CM | POA: Diagnosis not present

## 2013-09-04 LAB — CBC WITH DIFFERENTIAL/PLATELET
BASOS ABS: 0 10*3/uL (ref 0.0–0.1)
Basophil %: 0.7 %
EOS ABS: 0.1 10*3/uL (ref 0.0–0.7)
Eosinophil %: 1.8 %
HCT: 27.3 % — AB (ref 35.0–47.0)
HGB: 8.7 g/dL — ABNORMAL LOW (ref 12.0–16.0)
LYMPHS PCT: 29.1 %
Lymphocyte #: 1.8 10*3/uL (ref 1.0–3.6)
MCH: 27.2 pg (ref 26.0–34.0)
MCHC: 31.7 g/dL — AB (ref 32.0–36.0)
MCV: 86 fL (ref 80–100)
MONO ABS: 0.6 x10 3/mm (ref 0.2–0.9)
Monocyte %: 9.3 %
Neutrophil #: 3.7 10*3/uL (ref 1.4–6.5)
Neutrophil %: 59.1 %
PLATELETS: 364 10*3/uL (ref 150–440)
RBC: 3.19 10*6/uL — ABNORMAL LOW (ref 3.80–5.20)
RDW: 16.6 % — AB (ref 11.5–14.5)
WBC: 6.2 10*3/uL (ref 3.6–11.0)

## 2013-09-04 LAB — BASIC METABOLIC PANEL
Anion Gap: 12 (ref 7–16)
BUN: 16 mg/dL (ref 7–18)
CHLORIDE: 108 mmol/L — AB (ref 98–107)
CREATININE: 1.03 mg/dL (ref 0.60–1.30)
Calcium, Total: 9.1 mg/dL (ref 8.5–10.1)
Co2: 17 mmol/L — ABNORMAL LOW (ref 21–32)
EGFR (African American): 57 — ABNORMAL LOW
EGFR (Non-African Amer.): 49 — ABNORMAL LOW
GLUCOSE: 78 mg/dL (ref 65–99)
Osmolality: 274 (ref 275–301)
Potassium: 4.2 mmol/L (ref 3.5–5.1)
Sodium: 137 mmol/L (ref 136–145)

## 2013-09-04 LAB — PROTIME-INR
INR: 1.4
Prothrombin Time: 16.8 secs — ABNORMAL HIGH (ref 11.5–14.7)

## 2013-09-06 LAB — PROTIME-INR
INR: 1.4
Prothrombin Time: 16.9 secs — ABNORMAL HIGH (ref 11.5–14.7)

## 2013-09-08 LAB — PROTIME-INR
INR: 1.7
Prothrombin Time: 19.2 secs — ABNORMAL HIGH (ref 11.5–14.7)

## 2013-09-10 LAB — PROTIME-INR
INR: 1.9
Prothrombin Time: 21.1 secs — ABNORMAL HIGH (ref 11.5–14.7)

## 2013-09-10 LAB — CLOSTRIDIUM DIFFICILE(ARMC)

## 2013-09-11 LAB — PROTIME-INR
INR: 1.8
Prothrombin Time: 20.4 secs — ABNORMAL HIGH (ref 11.5–14.7)

## 2013-09-13 LAB — PROTIME-INR
INR: 2.3
Prothrombin Time: 24.9 secs — ABNORMAL HIGH (ref 11.5–14.7)

## 2013-09-15 ENCOUNTER — Encounter: Payer: Self-pay | Admitting: Internal Medicine

## 2013-09-18 DIAGNOSIS — K5730 Diverticulosis of large intestine without perforation or abscess without bleeding: Secondary | ICD-10-CM | POA: Diagnosis not present

## 2013-09-18 DIAGNOSIS — K922 Gastrointestinal hemorrhage, unspecified: Secondary | ICD-10-CM | POA: Diagnosis not present

## 2013-09-18 DIAGNOSIS — I82409 Acute embolism and thrombosis of unspecified deep veins of unspecified lower extremity: Secondary | ICD-10-CM | POA: Diagnosis not present

## 2013-09-18 DIAGNOSIS — Z87440 Personal history of urinary (tract) infections: Secondary | ICD-10-CM | POA: Diagnosis not present

## 2013-09-18 DIAGNOSIS — A0472 Enterocolitis due to Clostridium difficile, not specified as recurrent: Secondary | ICD-10-CM | POA: Diagnosis not present

## 2013-09-18 DIAGNOSIS — D500 Iron deficiency anemia secondary to blood loss (chronic): Secondary | ICD-10-CM | POA: Diagnosis not present

## 2013-09-18 DIAGNOSIS — I10 Essential (primary) hypertension: Secondary | ICD-10-CM | POA: Diagnosis not present

## 2013-09-18 DIAGNOSIS — R32 Unspecified urinary incontinence: Secondary | ICD-10-CM | POA: Diagnosis not present

## 2013-09-20 DIAGNOSIS — I10 Essential (primary) hypertension: Secondary | ICD-10-CM | POA: Diagnosis not present

## 2013-09-20 DIAGNOSIS — R32 Unspecified urinary incontinence: Secondary | ICD-10-CM | POA: Diagnosis not present

## 2013-09-20 DIAGNOSIS — K5730 Diverticulosis of large intestine without perforation or abscess without bleeding: Secondary | ICD-10-CM | POA: Diagnosis not present

## 2013-09-20 DIAGNOSIS — I82409 Acute embolism and thrombosis of unspecified deep veins of unspecified lower extremity: Secondary | ICD-10-CM | POA: Diagnosis not present

## 2013-09-20 DIAGNOSIS — A0472 Enterocolitis due to Clostridium difficile, not specified as recurrent: Secondary | ICD-10-CM | POA: Diagnosis not present

## 2013-09-20 DIAGNOSIS — D500 Iron deficiency anemia secondary to blood loss (chronic): Secondary | ICD-10-CM | POA: Diagnosis not present

## 2013-09-21 DIAGNOSIS — I82409 Acute embolism and thrombosis of unspecified deep veins of unspecified lower extremity: Secondary | ICD-10-CM | POA: Diagnosis not present

## 2013-09-21 DIAGNOSIS — M109 Gout, unspecified: Secondary | ICD-10-CM | POA: Diagnosis not present

## 2013-09-21 DIAGNOSIS — Z23 Encounter for immunization: Secondary | ICD-10-CM | POA: Diagnosis not present

## 2013-09-21 DIAGNOSIS — D649 Anemia, unspecified: Secondary | ICD-10-CM | POA: Diagnosis not present

## 2013-09-21 DIAGNOSIS — E538 Deficiency of other specified B group vitamins: Secondary | ICD-10-CM | POA: Diagnosis not present

## 2013-09-21 DIAGNOSIS — R7309 Other abnormal glucose: Secondary | ICD-10-CM | POA: Diagnosis not present

## 2013-09-21 DIAGNOSIS — Z1331 Encounter for screening for depression: Secondary | ICD-10-CM | POA: Diagnosis not present

## 2013-09-26 DIAGNOSIS — R32 Unspecified urinary incontinence: Secondary | ICD-10-CM | POA: Diagnosis not present

## 2013-09-26 DIAGNOSIS — I10 Essential (primary) hypertension: Secondary | ICD-10-CM | POA: Diagnosis not present

## 2013-09-26 DIAGNOSIS — I82409 Acute embolism and thrombosis of unspecified deep veins of unspecified lower extremity: Secondary | ICD-10-CM | POA: Diagnosis not present

## 2013-09-26 DIAGNOSIS — A0472 Enterocolitis due to Clostridium difficile, not specified as recurrent: Secondary | ICD-10-CM | POA: Diagnosis not present

## 2013-09-26 DIAGNOSIS — K5730 Diverticulosis of large intestine without perforation or abscess without bleeding: Secondary | ICD-10-CM | POA: Diagnosis not present

## 2013-09-26 DIAGNOSIS — D500 Iron deficiency anemia secondary to blood loss (chronic): Secondary | ICD-10-CM | POA: Diagnosis not present

## 2013-09-27 DIAGNOSIS — A0472 Enterocolitis due to Clostridium difficile, not specified as recurrent: Secondary | ICD-10-CM | POA: Diagnosis not present

## 2013-09-27 DIAGNOSIS — I10 Essential (primary) hypertension: Secondary | ICD-10-CM | POA: Diagnosis not present

## 2013-09-27 DIAGNOSIS — D500 Iron deficiency anemia secondary to blood loss (chronic): Secondary | ICD-10-CM | POA: Diagnosis not present

## 2013-09-27 DIAGNOSIS — R32 Unspecified urinary incontinence: Secondary | ICD-10-CM | POA: Diagnosis not present

## 2013-09-27 DIAGNOSIS — I82409 Acute embolism and thrombosis of unspecified deep veins of unspecified lower extremity: Secondary | ICD-10-CM | POA: Diagnosis not present

## 2013-09-27 DIAGNOSIS — K5730 Diverticulosis of large intestine without perforation or abscess without bleeding: Secondary | ICD-10-CM | POA: Diagnosis not present

## 2013-10-02 DIAGNOSIS — K5730 Diverticulosis of large intestine without perforation or abscess without bleeding: Secondary | ICD-10-CM | POA: Diagnosis not present

## 2013-10-02 DIAGNOSIS — I10 Essential (primary) hypertension: Secondary | ICD-10-CM | POA: Diagnosis not present

## 2013-10-02 DIAGNOSIS — A0472 Enterocolitis due to Clostridium difficile, not specified as recurrent: Secondary | ICD-10-CM | POA: Diagnosis not present

## 2013-10-02 DIAGNOSIS — R32 Unspecified urinary incontinence: Secondary | ICD-10-CM | POA: Diagnosis not present

## 2013-10-02 DIAGNOSIS — I82409 Acute embolism and thrombosis of unspecified deep veins of unspecified lower extremity: Secondary | ICD-10-CM | POA: Diagnosis not present

## 2013-10-02 DIAGNOSIS — D500 Iron deficiency anemia secondary to blood loss (chronic): Secondary | ICD-10-CM | POA: Diagnosis not present

## 2013-10-03 DIAGNOSIS — I82409 Acute embolism and thrombosis of unspecified deep veins of unspecified lower extremity: Secondary | ICD-10-CM | POA: Diagnosis not present

## 2013-10-03 DIAGNOSIS — K5730 Diverticulosis of large intestine without perforation or abscess without bleeding: Secondary | ICD-10-CM | POA: Diagnosis not present

## 2013-10-03 DIAGNOSIS — I10 Essential (primary) hypertension: Secondary | ICD-10-CM | POA: Diagnosis not present

## 2013-10-03 DIAGNOSIS — R32 Unspecified urinary incontinence: Secondary | ICD-10-CM | POA: Diagnosis not present

## 2013-10-03 DIAGNOSIS — A0472 Enterocolitis due to Clostridium difficile, not specified as recurrent: Secondary | ICD-10-CM | POA: Diagnosis not present

## 2013-10-03 DIAGNOSIS — D500 Iron deficiency anemia secondary to blood loss (chronic): Secondary | ICD-10-CM | POA: Diagnosis not present

## 2013-10-05 DIAGNOSIS — I87099 Postthrombotic syndrome with other complications of unspecified lower extremity: Secondary | ICD-10-CM | POA: Diagnosis not present

## 2013-10-05 DIAGNOSIS — M7989 Other specified soft tissue disorders: Secondary | ICD-10-CM | POA: Diagnosis not present

## 2013-10-05 DIAGNOSIS — I824Y9 Acute embolism and thrombosis of unspecified deep veins of unspecified proximal lower extremity: Secondary | ICD-10-CM | POA: Diagnosis not present

## 2013-10-10 DIAGNOSIS — K5730 Diverticulosis of large intestine without perforation or abscess without bleeding: Secondary | ICD-10-CM | POA: Diagnosis not present

## 2013-10-10 DIAGNOSIS — A0472 Enterocolitis due to Clostridium difficile, not specified as recurrent: Secondary | ICD-10-CM | POA: Diagnosis not present

## 2013-10-10 DIAGNOSIS — I82409 Acute embolism and thrombosis of unspecified deep veins of unspecified lower extremity: Secondary | ICD-10-CM | POA: Diagnosis not present

## 2013-10-10 DIAGNOSIS — R32 Unspecified urinary incontinence: Secondary | ICD-10-CM | POA: Diagnosis not present

## 2013-10-10 DIAGNOSIS — I10 Essential (primary) hypertension: Secondary | ICD-10-CM | POA: Diagnosis not present

## 2013-10-10 DIAGNOSIS — D500 Iron deficiency anemia secondary to blood loss (chronic): Secondary | ICD-10-CM | POA: Diagnosis not present

## 2013-10-17 DIAGNOSIS — K5730 Diverticulosis of large intestine without perforation or abscess without bleeding: Secondary | ICD-10-CM | POA: Diagnosis not present

## 2013-10-17 DIAGNOSIS — A0472 Enterocolitis due to Clostridium difficile, not specified as recurrent: Secondary | ICD-10-CM | POA: Diagnosis not present

## 2013-10-17 DIAGNOSIS — I10 Essential (primary) hypertension: Secondary | ICD-10-CM | POA: Diagnosis not present

## 2013-10-17 DIAGNOSIS — D500 Iron deficiency anemia secondary to blood loss (chronic): Secondary | ICD-10-CM | POA: Diagnosis not present

## 2013-10-17 DIAGNOSIS — R32 Unspecified urinary incontinence: Secondary | ICD-10-CM | POA: Diagnosis not present

## 2013-10-17 DIAGNOSIS — I82409 Acute embolism and thrombosis of unspecified deep veins of unspecified lower extremity: Secondary | ICD-10-CM | POA: Diagnosis not present

## 2013-10-18 DIAGNOSIS — D649 Anemia, unspecified: Secondary | ICD-10-CM | POA: Diagnosis not present

## 2013-10-18 DIAGNOSIS — Z23 Encounter for immunization: Secondary | ICD-10-CM | POA: Diagnosis not present

## 2013-10-18 DIAGNOSIS — A0472 Enterocolitis due to Clostridium difficile, not specified as recurrent: Secondary | ICD-10-CM | POA: Diagnosis not present

## 2013-10-18 DIAGNOSIS — E538 Deficiency of other specified B group vitamins: Secondary | ICD-10-CM | POA: Diagnosis not present

## 2013-10-18 DIAGNOSIS — Z1331 Encounter for screening for depression: Secondary | ICD-10-CM | POA: Diagnosis not present

## 2013-10-18 DIAGNOSIS — I82409 Acute embolism and thrombosis of unspecified deep veins of unspecified lower extremity: Secondary | ICD-10-CM | POA: Diagnosis not present

## 2013-10-23 DIAGNOSIS — D649 Anemia, unspecified: Secondary | ICD-10-CM | POA: Diagnosis not present

## 2013-10-29 DIAGNOSIS — I10 Essential (primary) hypertension: Secondary | ICD-10-CM | POA: Diagnosis not present

## 2013-10-31 DIAGNOSIS — A0472 Enterocolitis due to Clostridium difficile, not specified as recurrent: Secondary | ICD-10-CM | POA: Diagnosis not present

## 2013-10-31 DIAGNOSIS — D500 Iron deficiency anemia secondary to blood loss (chronic): Secondary | ICD-10-CM | POA: Diagnosis not present

## 2013-10-31 DIAGNOSIS — I82409 Acute embolism and thrombosis of unspecified deep veins of unspecified lower extremity: Secondary | ICD-10-CM | POA: Diagnosis not present

## 2013-10-31 DIAGNOSIS — K5730 Diverticulosis of large intestine without perforation or abscess without bleeding: Secondary | ICD-10-CM | POA: Diagnosis not present

## 2013-10-31 DIAGNOSIS — R32 Unspecified urinary incontinence: Secondary | ICD-10-CM | POA: Diagnosis not present

## 2013-10-31 DIAGNOSIS — I10 Essential (primary) hypertension: Secondary | ICD-10-CM | POA: Diagnosis not present

## 2013-11-02 DIAGNOSIS — I10 Essential (primary) hypertension: Secondary | ICD-10-CM | POA: Diagnosis not present

## 2013-11-02 DIAGNOSIS — I82409 Acute embolism and thrombosis of unspecified deep veins of unspecified lower extremity: Secondary | ICD-10-CM | POA: Diagnosis not present

## 2013-11-02 DIAGNOSIS — R32 Unspecified urinary incontinence: Secondary | ICD-10-CM | POA: Diagnosis not present

## 2013-11-02 DIAGNOSIS — A0472 Enterocolitis due to Clostridium difficile, not specified as recurrent: Secondary | ICD-10-CM | POA: Diagnosis not present

## 2013-11-02 DIAGNOSIS — K5730 Diverticulosis of large intestine without perforation or abscess without bleeding: Secondary | ICD-10-CM | POA: Diagnosis not present

## 2013-11-02 DIAGNOSIS — D500 Iron deficiency anemia secondary to blood loss (chronic): Secondary | ICD-10-CM | POA: Diagnosis not present

## 2013-11-07 DIAGNOSIS — I82409 Acute embolism and thrombosis of unspecified deep veins of unspecified lower extremity: Secondary | ICD-10-CM | POA: Diagnosis not present

## 2013-11-07 DIAGNOSIS — K5730 Diverticulosis of large intestine without perforation or abscess without bleeding: Secondary | ICD-10-CM | POA: Diagnosis not present

## 2013-11-07 DIAGNOSIS — I10 Essential (primary) hypertension: Secondary | ICD-10-CM | POA: Diagnosis not present

## 2013-11-07 DIAGNOSIS — A0472 Enterocolitis due to Clostridium difficile, not specified as recurrent: Secondary | ICD-10-CM | POA: Diagnosis not present

## 2013-11-07 DIAGNOSIS — R32 Unspecified urinary incontinence: Secondary | ICD-10-CM | POA: Diagnosis not present

## 2013-11-07 DIAGNOSIS — D500 Iron deficiency anemia secondary to blood loss (chronic): Secondary | ICD-10-CM | POA: Diagnosis not present

## 2013-11-14 DIAGNOSIS — I10 Essential (primary) hypertension: Secondary | ICD-10-CM | POA: Diagnosis not present

## 2013-11-14 DIAGNOSIS — I82409 Acute embolism and thrombosis of unspecified deep veins of unspecified lower extremity: Secondary | ICD-10-CM | POA: Diagnosis not present

## 2013-11-14 DIAGNOSIS — R32 Unspecified urinary incontinence: Secondary | ICD-10-CM | POA: Diagnosis not present

## 2013-11-14 DIAGNOSIS — D500 Iron deficiency anemia secondary to blood loss (chronic): Secondary | ICD-10-CM | POA: Diagnosis not present

## 2013-11-14 DIAGNOSIS — K5730 Diverticulosis of large intestine without perforation or abscess without bleeding: Secondary | ICD-10-CM | POA: Diagnosis not present

## 2013-11-14 DIAGNOSIS — A0472 Enterocolitis due to Clostridium difficile, not specified as recurrent: Secondary | ICD-10-CM | POA: Diagnosis not present

## 2013-11-16 DIAGNOSIS — D649 Anemia, unspecified: Secondary | ICD-10-CM | POA: Diagnosis not present

## 2013-11-16 DIAGNOSIS — R6889 Other general symptoms and signs: Secondary | ICD-10-CM | POA: Diagnosis not present

## 2013-11-16 DIAGNOSIS — I82402 Acute embolism and thrombosis of unspecified deep veins of left lower extremity: Secondary | ICD-10-CM | POA: Diagnosis not present

## 2013-11-16 DIAGNOSIS — Z1389 Encounter for screening for other disorder: Secondary | ICD-10-CM | POA: Diagnosis not present

## 2013-12-03 DIAGNOSIS — R6889 Other general symptoms and signs: Secondary | ICD-10-CM | POA: Diagnosis not present

## 2013-12-03 DIAGNOSIS — Z1389 Encounter for screening for other disorder: Secondary | ICD-10-CM | POA: Diagnosis not present

## 2013-12-03 DIAGNOSIS — M109 Gout, unspecified: Secondary | ICD-10-CM | POA: Diagnosis not present

## 2013-12-03 DIAGNOSIS — D649 Anemia, unspecified: Secondary | ICD-10-CM | POA: Diagnosis not present

## 2013-12-03 DIAGNOSIS — I82402 Acute embolism and thrombosis of unspecified deep veins of left lower extremity: Secondary | ICD-10-CM | POA: Diagnosis not present

## 2013-12-03 DIAGNOSIS — Z23 Encounter for immunization: Secondary | ICD-10-CM | POA: Diagnosis not present

## 2013-12-10 DIAGNOSIS — I82402 Acute embolism and thrombosis of unspecified deep veins of left lower extremity: Secondary | ICD-10-CM | POA: Diagnosis not present

## 2013-12-10 DIAGNOSIS — E538 Deficiency of other specified B group vitamins: Secondary | ICD-10-CM | POA: Diagnosis not present

## 2013-12-10 DIAGNOSIS — M109 Gout, unspecified: Secondary | ICD-10-CM | POA: Diagnosis not present

## 2013-12-10 DIAGNOSIS — Z1389 Encounter for screening for other disorder: Secondary | ICD-10-CM | POA: Diagnosis not present

## 2013-12-10 DIAGNOSIS — R6889 Other general symptoms and signs: Secondary | ICD-10-CM | POA: Diagnosis not present

## 2013-12-10 DIAGNOSIS — L650 Telogen effluvium: Secondary | ICD-10-CM | POA: Diagnosis not present

## 2013-12-10 DIAGNOSIS — Z23 Encounter for immunization: Secondary | ICD-10-CM | POA: Diagnosis not present

## 2013-12-17 DIAGNOSIS — L650 Telogen effluvium: Secondary | ICD-10-CM | POA: Diagnosis not present

## 2013-12-25 DIAGNOSIS — I809 Phlebitis and thrombophlebitis of unspecified site: Secondary | ICD-10-CM | POA: Diagnosis not present

## 2013-12-25 DIAGNOSIS — I82402 Acute embolism and thrombosis of unspecified deep veins of left lower extremity: Secondary | ICD-10-CM | POA: Diagnosis not present

## 2013-12-25 DIAGNOSIS — R6889 Other general symptoms and signs: Secondary | ICD-10-CM | POA: Diagnosis not present

## 2013-12-25 DIAGNOSIS — I824Y9 Acute embolism and thrombosis of unspecified deep veins of unspecified proximal lower extremity: Secondary | ICD-10-CM | POA: Diagnosis not present

## 2013-12-25 DIAGNOSIS — L650 Telogen effluvium: Secondary | ICD-10-CM | POA: Diagnosis not present

## 2013-12-25 DIAGNOSIS — M109 Gout, unspecified: Secondary | ICD-10-CM | POA: Diagnosis not present

## 2013-12-25 DIAGNOSIS — Z23 Encounter for immunization: Secondary | ICD-10-CM | POA: Diagnosis not present

## 2013-12-25 DIAGNOSIS — Z1389 Encounter for screening for other disorder: Secondary | ICD-10-CM | POA: Diagnosis not present

## 2013-12-31 ENCOUNTER — Ambulatory Visit: Payer: Self-pay | Admitting: Vascular Surgery

## 2013-12-31 DIAGNOSIS — I87099 Postthrombotic syndrome with other complications of unspecified lower extremity: Secondary | ICD-10-CM | POA: Diagnosis not present

## 2013-12-31 DIAGNOSIS — K922 Gastrointestinal hemorrhage, unspecified: Secondary | ICD-10-CM | POA: Diagnosis not present

## 2013-12-31 DIAGNOSIS — Z452 Encounter for adjustment and management of vascular access device: Secondary | ICD-10-CM | POA: Diagnosis not present

## 2013-12-31 DIAGNOSIS — I824Y9 Acute embolism and thrombosis of unspecified deep veins of unspecified proximal lower extremity: Secondary | ICD-10-CM | POA: Diagnosis not present

## 2013-12-31 DIAGNOSIS — I82409 Acute embolism and thrombosis of unspecified deep veins of unspecified lower extremity: Secondary | ICD-10-CM | POA: Diagnosis not present

## 2013-12-31 LAB — PROTIME-INR
INR: 2
PROTHROMBIN TIME: 21.8 s — AB (ref 11.5–14.7)

## 2013-12-31 LAB — CREATININE, SERUM
Creatinine: 1.11 mg/dL (ref 0.60–1.30)
EGFR (Non-African Amer.): 50 — ABNORMAL LOW

## 2013-12-31 LAB — BUN: BUN: 22 mg/dL — ABNORMAL HIGH (ref 7–18)

## 2014-02-12 DIAGNOSIS — M7989 Other specified soft tissue disorders: Secondary | ICD-10-CM | POA: Diagnosis not present

## 2014-02-12 DIAGNOSIS — M79609 Pain in unspecified limb: Secondary | ICD-10-CM | POA: Diagnosis not present

## 2014-02-12 DIAGNOSIS — I824Y9 Acute embolism and thrombosis of unspecified deep veins of unspecified proximal lower extremity: Secondary | ICD-10-CM | POA: Diagnosis not present

## 2014-03-18 DIAGNOSIS — E876 Hypokalemia: Secondary | ICD-10-CM | POA: Diagnosis not present

## 2014-03-18 DIAGNOSIS — E538 Deficiency of other specified B group vitamins: Secondary | ICD-10-CM | POA: Diagnosis not present

## 2014-03-18 DIAGNOSIS — R413 Other amnesia: Secondary | ICD-10-CM | POA: Diagnosis not present

## 2014-03-18 DIAGNOSIS — R5383 Other fatigue: Secondary | ICD-10-CM | POA: Diagnosis not present

## 2014-03-18 LAB — TSH: TSH: 1.95 u[IU]/mL (ref <=5.90)

## 2014-04-16 DIAGNOSIS — E876 Hypokalemia: Secondary | ICD-10-CM | POA: Diagnosis not present

## 2014-04-29 DIAGNOSIS — E876 Hypokalemia: Secondary | ICD-10-CM | POA: Diagnosis not present

## 2014-04-29 DIAGNOSIS — R5383 Other fatigue: Secondary | ICD-10-CM | POA: Diagnosis not present

## 2014-04-29 DIAGNOSIS — R413 Other amnesia: Secondary | ICD-10-CM | POA: Diagnosis not present

## 2014-06-08 NOTE — Op Note (Signed)
PATIENT NAME:  Kerri Ramirez, [name] MR#:  [id number] DATE OF BIRTH:  09/12/1925  DATE OF PROCEDURE:  07/25/2013   PREOPERATIVE DIAGNOSIS: Massive lower gastrointestinal bleed secondary to diverticulosis.  POSTOPERATIVE DIAGNOSIS: Massive lower gastrointestinal bleed secondary to diverticulosis.  PROCEDURE PERFORMED: Extended right hemicolectomy.   SURGEON: Natale LayMark Arisbel Maione, MD.   ASSISTANT: None.   ANESTHESIA: General endotracheal.   SPECIMENS: Right colon.   ESTIMATED BLOOD LOSS: 50 mL.   DRAINS: None.  LAP and needle count correct x 2.   DESCRIPTION OF PROCEDURE: With informed consent, supine position and general endotracheal anesthesia, the patient's abdomen was widely prepped and draped with ChloraPrep solution. Timeout was observed.   A primary transverse skin incision was fashioned in the mid abdomen and carried through musculofascial layers with electrocautery. A self-retaining abdominal wall retractor was placed. Palpation of the liver demonstrated no abnormalities. Palpation of the right colon and hepatic flexure demonstrated no mass. The right colon was mobilized along the white line of [place] utilizing electrocautery with duodenal sweep being identified. The omentum was reflected off the transverse colon in the avascular plane entering the lesser sac utilizing electrocautery. Larger vessels were divided with the Harmonic scalpel.   The terminal ileum was divided with the fire of the GIA 75 stapler. The mid transverse colon was then divided just to the middle colic vessels utilizing a second fire of the GIA 75 stapler. The intervening mesentery was then sequentially scored, clamped, cut and tied and divided with the Harmonic scalpel.   The specimen was handed off the table. Opening specimen on the back table demonstrated what appeared to be some organized clot in the hepatic flexure. This was consistent with the preoperative imaging.   A side-to-side functional end-to-end anastomosis  was created between the 2 limbs of the large and small bowel. Seromuscular sutures were then placed along antimesenteric borders. The corner of each staple line was excised. Common channel enterotomy was then fashioned with a GIA 75 stapler. The end of the bowel was closed with a TA-60 stapler. Staple lines were then reinforced utilizing seromuscular 3-0 silk sutures. Mesenteric defect was closed with a running locking 3-0 chromic suture. Lap and needles were correct x 2, the abdomen was copiously irrigated and aspirated dry. The fascia was closed in 2 layers utilizing #1 Vicryl. The skin edges reapproximated utilizing a skin stapler. Sterile dressing was applied. The patient was then subsequently extubated and taken to the recovery room in stable and satisfactory condition by anesthesia services.    ____________________________ Redge GainerMark A. Egbert GaribaldiBird, MD FACS mab:ce D: 07/25/2013 17:11:26 ET T: 07/25/2013 19:57:04 ET JOB#: [id number]  cc: Loraine LericheMark A. Egbert GaribaldiBird, MD, <Dictator> Raynald KempMARK A Naquisha Whitehair MD ELECTRONICALLY SIGNED 07/29/2013 11:56

## 2014-06-08 NOTE — Consult Note (Signed)
Pt discussed with NP Fransico SettersKim Mills, LGI bleed, hx of diverticulosis, she had bright bleeding in nuclear medicine and a positive bleeding scan on GI bleeding scan just now completed.  Will get urgent consult with vascular surgery for probable angiographic intervention.  Dr. Gilda CreaseSchnier notified.   Electronic Signatures: Scot JunElliott, Imad Shostak T (MD)  (Signed on 02-Jun-15 15:45)  Authored  Last Updated: 02-Jun-15 15:45 by Scot JunElliott, Euclide Granito T (MD)

## 2014-06-08 NOTE — Consult Note (Signed)
CHIEF COMPLAINT and HISTORY:  Subjective/Chief Complaint LLE swelling   History of Present Illness 81 yo WF with recent history of GI bleed and right hemicolectomy.  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  She was discharged to [facility] after an extended hospital stay including C. Diff and ARF, and had been recovering well but has had several days of LLE swelling.  Was found to have extensive LLE DVT on duplex.  With recent GI bleed, we are asked to place an IVC filter   Past History as above.  Mostly from her recent hospitalizations   PAST MEDICAL/SURGICAL HISTORY:  Past Medical History:   HTN:   ALLERGIES:  Allergies:  No Known Allergies:   HOME MEDICATIONS:  Home Medications: Medication Instructions Status  acetaminophen 325 mg oral tablet 2 tab(s) orally every 4 hours, As needed, pain or temp. greater than 100.4 Active  iron polysaccharide (as elemental iron) 150 mg oral capsule 1 cap(s) orally 2 times a day Active  pantoprazole 40 mg oral delayed release tablet 1 tab(s) orally once a day Active  Fish Oil 1000 mg oral capsule 1 cap(s) orally 2 times a day Active  Colcrys 0.6 mg oral tablet 1 tab(s) orally once a day Active   Family and Social History:  Family History Non-Contributory   Social History negative tobacco, negative ETOH, negative Illicit drugs   Place of Living Nursing Home  only recently.  Was living at home prior to hospitalization last month   Review of Systems:  Subjective/Chief Complaint LLE swelling recent GI bleed   Fever/Chills No   Cough No   Sputum No   Abdominal Pain Yes   Diarrhea No   Constipation No   Nausea/Vomiting No   SOB/DOE No   Chest Pain No   Telemetry Reviewed NSR   Dysuria No   Tolerating PT Yes   Tolerating Diet Yes   Physical Exam:  GEN well developed, well nourished   HEENT pink conjunctivae, moist oral mucosa   NECK No masses  trachea midline   RESP normal resp effort  no  use of accessory muscles   CARD regular rate  no thrills   VASCULAR ACCESS none   ABD denies tenderness  soft  normal BS   GU no superpubic tenderness   LYMPH negative neck, negative axillae   EXTR negative cyanosis/clubbing, positive edema, 2 + LLE edema   SKIN normal to palpation, No ulcers   NEURO cranial nerves intact, motor/sensory function intact   PSYCH alert, A+O to time, place, person   LABS:  Laboratory Results: LabObservation:    15-Jul-15 20:33, [place] Color Flow Doppler Low Extrem Left (Leg)  OBSERVATION   Reason for Test swelling, pain  Hepatic:    15-Jul-15 22:34, Comprehensive Metabolic Panel  Bilirubin, Total 0.4  Alkaline Phosphatase 64  45-117  NOTE: New Reference Range  01/05/13  SGPT (ALT) 13  SGOT (AST) 26  Total Protein, Serum 6.5  Albumin, Serum 2.9  Routine Chem:  Glucose, Serum 123  BUN 29  Creatinine (comp) 2.16  Sodium, Serum 130  Potassium, Serum 4.4  Chloride, Serum 102  CO2, Serum 17  Calcium (Total), Serum 9.1  Osmolality (calc) 268  eGFR (African American) 23  eGFR (Non-African American) 20  eGFR values <6m/min/1.73 m2 may be an indication of chronic  kidney disease (CKD).  Calculated eGFR is useful in patients with stable renal function.  The eGFR calculation will not be reliable in acutely ill  patients  when serum creatinine is changing rapidly. It is not useful in   patients on dialysis. The eGFR calculation may not be applicable  to patients at the low and high extremes of body sizes, pregnant  women, and vegetarians.  Anion Gap 11    16-Jul-15 71:24, Basic Metabolic Panel (w/Total Calcium)  Glucose, Serum 98  BUN 30  Creatinine (comp) 1.98  Sodium, Serum 130  Potassium, Serum 4.4  Chloride, Serum 104  CO2, Serum 18  Calcium (Total), Serum 9.2  Anion Gap 8  Osmolality (calc) 267  eGFR (African American) 26  eGFR (Non-African American) 22  eGFR values <70m/min/1.73 m2 may be an indication of chronic  kidney  disease (CKD).  Calculated eGFR is useful in patients with stable renal function.  The eGFR calculation will not be reliable in acutely ill patients  when serum creatinine is changing rapidly. It is not useful in   patients on dialysis. The eGFR calculation may not be applicable  to patients at the low and high extremes of body sizes, pregnant  women, and vegetarians.    17-Jul-15 058:09 Basic Metabolic Panel (w/Total Calcium)  Glucose, Serum 87  BUN 28  Creatinine (comp) 1.47  Sodium, Serum 133  Potassium, Serum 4.0  Chloride, Serum 110  CO2, Serum 17  Calcium (Total), Serum 8.6  Anion Gap 6  Osmolality (calc) 271  eGFR (African American) 37  eGFR (Non-African American) 32  eGFR values <616mmin/1.73 m2 may be an indication of chronic  kidney disease (CKD).  Calculated eGFR is useful in patients with stable renal function.  The eGFR calculation will not be reliable in acutely ill patients  when serum creatinine is changing rapidly. It is not useful in   patients on dialysis. The eGFR calculation may not be applicable  to patients at the low and high extremes of body sizes, pregnant  women, and vegetarians.  Routine Sero:    16-Jul-15 23:51, Occult Blood, Feces  Occult Blood, Feces POSITIVE  Result(s) reported on 31 Aug 2013 at 12:30AM.  Routine Coag:    15-Jul-15 18:46, Activated PTT  Activated PTT (APTT) 30.3  A HCT value >55% may artifactually increase the APTT. In one study,  the increase was an average of 19%.  Reference: "Effect on Routine and Special Coagulation Testing Values  of Citrate Anticoagulant Adjustment in Patients with High HCT Values."  American Journal of Clinical Pathology 2006;126:400-405.    15-Jul-15 18:46, Prothrombin Time  Prothrombin 14.2  INR 1.1  INR reference interval applies to patients on anticoagulant therapy.  A single INR therapeutic range for coumarins is not optimal for all  indications; however, the suggested range for most  indications is  2.0 - 3.0.  Exceptions to the INR Reference Range may include: Prosthetic heart  valves, acute myocardial infarction, prevention of myocardial  infarction, and combinations of aspirin and anticoagulant. The need  for a higher or lower target INR must be assessed individually.  Reference: The Pharmacology and Management of the Vitamin K   antagonists: the seventh ACCP Conference on Antithrombotic and  Thrombolytic Therapy. [id number].2505ept:126 (3suppl): [id number] A HCT value >55% may artifactually increase the PT.  In one study,   the increase was an average of 25%.  Reference:  "Effect on Routine and Special Coagulation Testing Values  of Citrate Anticoagulant Adjustment in Patients with High HCT Values."  American Journal of Clinical Pathology 2006;126:400-405.    16-Jul-15 04:42, Activated PTT  Activated PTT (APTT) 129.8  A HCT  value >55% may artifactually increase the APTT. In one study,  the increase was an average of 19%.  Reference: "Effect on Routine and Special Coagulation Testing Values  of Citrate Anticoagulant Adjustment in Patients with High HCT Values."  American Journal of Clinical Pathology 2006;126:400-405.    17-Jul-15 05:24, Prothrombin Time  Prothrombin 13.4  INR 1.0  INR reference interval applies to patients on anticoagulant therapy.  A single INR therapeutic range for coumarins is not optimal for all  indications; however, the suggested range for most indications is  2.0 - 3.0.  Exceptions to the INR Reference Range may include: Prosthetic heart  valves, acute myocardial infarction, prevention of myocardial  infarction, and combinations of aspirin and anticoagulant. The need  for a higher orlower target INR must be assessed individually.  Reference: The Pharmacology and Management of the Vitamin K   antagonists: the seventh ACCP Conference on Antithrombotic and  Thrombolytic Therapy. JOINO.6767 Sept:126 (3suppl): [id number].  A HCT value >55%  may artifactually increase the PT.  In one study,   the increase was an average of 25%.  Reference:  "Effect on Routine and Special Coagulation Testing Values  of Citrate Anticoagulant Adjustment in Patients with High HCT Values."  American Journal of Clinical Pathology 2006;126:400-405.    18-Jul-15 04:31, Prothrombin Time  Prothrombin 14.0  INR 1.1  INR reference interval applies to patients on anticoagulant therapy.  A single INR therapeutic range for coumarins is not optimal for all  indications; however, the suggested range for most indications is  2.0 - 3.0.  Exceptions to the INR Reference Range may include: Prosthetic heart  valves, acute myocardial infarction, prevention of myocardial  infarction, and combinations of aspirin and anticoagulant. The need  for a higher or lower target INR must be assessed individually.  Reference: The Pharmacology and Management of the Vitamin K   antagonists: the seventh ACCP Conference on Antithrombotic and  Thrombolytic Therapy. [id number].0962 Sept:126 (3suppl): [id number].  A HCT value >55% may artifactually increase the PT.  In one study,   the increase was an average of 25%.  Reference:  "Effect on Routine and Special Coagulation Testing Values  of Citrate Anticoagulant Adjustment in Patients with High HCT Values."  American Journal of Clinical Pathology 2006;126:400-405.  Routine Hem:    15-Jul-15 22:34, Hemogram, Platelet Count  WBC (CBC) 14.7  RBC (CBC) 3.65  Hemoglobin (CBC) 9.8  Hematocrit (CBC) 31.1  Platelet Count (CBC) 275  Result(s) reported on 29 Aug 2013 at 09:25PM.  MCV 85  MCH 26.9  MCHC 31.5  RDW 16.5    16-Jul-15 04:42, CBC Profile  WBC (CBC) 12.5  RBC (CBC) 3.30  Hemoglobin (CBC) 8.9  Hematocrit (CBC) 28.3  Platelet Count (CBC) 252  MCV 86  MCH 26.9  MCHC 31.4  RDW 16.7  Neutrophil % 71.5  Lymphocyte % 20.0  Monocyte % 7.5  Eosinophil % 0.4  Basophil % 0.6  Neutrophil # 8.9  Lymphocyte # 2.5  Monocyte # 0.9   Eosinophil # 0.1  Basophil # 0.1  Result(s) reported on 30 Aug 2013 at 05:57AM.    17-Jul-15 05:24, CBC Profile  WBC (CBC) 8.8  RBC (CBC) 3.05  Hemoglobin (CBC) 8.4  Hematocrit (CBC) 25.9  Platelet Count (CBC) 264  MCV 85  MCH 27.7  MCHC 32.6  RDW 16.6  Neutrophil % 74.2  Lymphocyte % 16.3  Monocyte % 8.4  Eosinophil % 0.6  Basophil % 0.5  Neutrophil # 6.5  Lymphocyte # 1.4  Monocyte #  0.7  Eosinophil # 0.1  Basophil # 0.0  Result(s) reported on 31 Aug 2013 at 06:01AM.    18-Jul-15 04:31, CBC Profile  WBC (CBC) 7.3  RBC (CBC) 2.98  Hemoglobin (CBC) 8.2  Hematocrit (CBC) 25.7  Platelet Count (CBC) 294  MCV 86  MCH 27.3  MCHC 31.8  RDW 16.7  Neutrophil % 65.7  Lymphocyte % 23.9  Monocyte % 8.4  Eosinophil % 1.4  Basophil % 0.6  Neutrophil # 4.8  Lymphocyte # 1.7  Monocyte # 0.6  Eosinophil # 0.1  Basophil # 0.0  Result(s) reported on 01 Sep 2013 at 05:29AM.   RADIOLOGY:  Radiology Results: XRay:    02-Jun-15 19:30, Chest Portable Single View  Chest Portable Single View  REASON FOR EXAM:    Vomited during vascular procedure.  ?? aspiration   pneumoina. Please do CXR once  COMMENTS:       PROCEDURE: DXR - DXR PORTABLE CHEST SINGLE VIEW  - Jul 17 2013  7:30PM     CLINICAL DATA:  Vomiting.    EXAM:  PORTABLE CHEST - 1 VIEW    COMPARISON:  04/11/2012    FINDINGS:  Right internal jugular central venous catheter with its tip at the  cavoatrial junction and no pneumothorax. There is hazy airspace  disease throughout the left lung. Right lung is grossly clear.  Calcifiedgranuloma in the left midlung zone.     IMPRESSION:  Hazy airspace disease throughout the left lung.      Electronically Signed    By: Maryclare Bean M.D.    On: 07/17/2013 19:41         Verified By: Jamas Lav, M.D.,    03-Jun-15 07:03, Chest Portable Single View  Chest Portable Single View  REASON FOR EXAM:    possible aspiration 6/2  COMMENTS:       PROCEDURE: DXR - DXR  PORTABLE CHEST SINGLE VIEW  - Jul 18 2013  7:03AM     CLINICAL DATA:  Aspiration    EXAM:  PORTABLE CHEST - 1 VIEW    COMPARISON:  July 17, 2013    FINDINGS:  Central catheter tip is in the superior vena cava. No pneumothorax.  There is a small granuloma in the left upper lobe. There is  currently no edema or consolidation. The heart is upper normal in  size with normal pulmonary vascularity. No adenopathy. There is  atherosclerotic change in the aorta.     IMPRESSION:  No edema or consolidation. Small granuloma left upper lobe. No  pneumothorax.      Electronically Signed    By: Lowella Grip M.D.    On: 07/18/2013 08:14         Verified By: Leafy Kindle. WOODRUFF, M.D.,    14-Jun-15 09:10, Abdomen Flat and Erect  Abdomen Flat and Erect  REASON FOR EXAM:    vomiting  COMMENTS:       PROCEDURE: DXR - DXR ABDOMEN 2 V FLAT AND ERECT  - Jul 29 2013  9:10AM     CLINICAL DATA:  Vomiting.  Pain.    EXAM:  ABDOMEN - 2 VIEW    COMPARISON:  None.    FINDINGS:  Multiple dilated loops of small bowel noted with a paucity of  colonic gas. These findings suggest small bowel obstruction. No free  air. Surgical staples noted over the pelvis. Degenerative changes  thoracolumbar spine.     IMPRESSION:  Findings consistent with small-bowel obstruction. These results were  called by telephone at the time of interpretation on 07/29/2013 at  9:32 AM to nurse Legrand Como, who verbally acknowledged these results.  The nurse was instructed to inform the patient's physician  immediately.      ElectronicallySigned    By: Marcello Moores  Register    On: 07/29/2013 09:34     Verified By: Osa Craver, M.D., MD    16-Jun-15 07:51, KUB - Kidney Ureter Bladder  KUB - Kidney Ureter Bladder  REASON FOR EXAM:    vomitinh  COMMENTS:       PROCEDURE: DXR - DXR KIDNEY URETER BLADDER  - Jul 31 2013  7:51AM     CLINICAL DATA:  Vomiting    EXAM:  ABDOMEN - 1 VIEW    COMPARISON:   07/29/2013    FINDINGS:  Postsurgical changes are again seen. Stable small bowel dilatation  is noted. No definitive free air is seen. A relative paucity of  colonic bowel gas is noted. No acute bony abnormality is seen.     IMPRESSION:  Persistent small bowel dilatation consistent with a partial small  bowel obstruction or small-bowel ileus. Correlation with physical  exam is recommended.      Electronically Signed    By: Inez Catalina M.D.    On: 07/31/2013 08:16         Verified By: Everlene Farrier, M.D.,    16-Jun-15 13:12, Chest Portable Single View  Chest Portable Single View  REASON FOR EXAM:    fever, coug, eval  COMMENTS:       PROCEDURE: DXR - DXR PORTABLE CHEST SINGLE VIEW  - Jul 31 2013  1:12PM     CLINICAL DATA:  Fever and cough    EXAM:  PORTABLE CHEST - 1 VIEW    COMPARISON:  Portable exam 7619 hr compared to 07/18/2013    FINDINGS:  Enlargement of cardiac silhouette with pulmonary vascular  congestion.  Calcified tortuous aorta.    Calcified granuloma LEFT upper lobe.    Atelectasis versus consolidation in medial RIGHT lower lobe.    Remaining lungs clear.    No pleural effusion or pneumothorax.    Bones demineralized.    Previously identified RIGHT jugular line has been removed.     IMPRESSION:  Atelectasis versus consolidation in medial RIGHT lower lobe.    Old granulomatous disease.    Enlargement of cardiac silhouette with pulmonary vascular  congestion.      Electronically Signed    By: Lavonia Dana M.D.    On: 07/31/2013 13:15         Verified By: Burnetta Sabin, M.D.,    20-Jun-15 15:59, Abdomen Flat and Erect  Abdomen Flat and Erect  REASON FOR EXAM:    Ileus  COMMENTS:       PROCEDURE: DXR - DXR ABDOMEN 2 V FLAT AND ERECT  - Aug 04 2013  3:59PM     CLINICAL DATA:  pt has hx of ileus    EXAM:  ABDOMEN - 2 VIEW    COMPARISON:  Abdominal 07/31/2013    FINDINGS:  Cysts and areas of the dilated loops of small bowel and  air-filled  loops of large bowel. Surgical staples across lower abdomen. No  acute osseous abnormalities.     IMPRESSION:  Persistent dilated loops of small bowel in air-filled loops of large  bowel. Consistent with partial small bowel obstruction versus an  ileus.      Electronically Signed    By: Margaree Mackintosh  M.D.    On: 08/04/2013 16:06         Verified By: Mikki Santee, M.D., MD  [place]:    15-Jul-15 20:33, [place] Color Flow Doppler Low Extrem Left (Leg)  [place] Color Flow Doppler Low Extrem Left (Leg)  REASON FOR EXAM:    swelling, pain  COMMENTS:       PROCEDURE: [place]  - US DOPPLER LOW EXTR LEFT  - Aug 29 2013  8:33PM     CLINICAL DATA:  Swelling pain left lower extremity    EXAM:  LEFT LOWER EXTREMITY VENOUS DOPPLER ULTRASOUND    TECHNIQUE:  Gray-scale sonography with graded compression, as well as color  Doppler and duplex ultrasound were performed to evaluate the lower  extremity deep venous systems from the level of the common femoral  vein and including the common femoral, femoral, profundafemoral,  popliteal and calf veins including the posterior tibial, peroneal  and gastrocnemius veins when visible. The superficial great  saphenous vein was also interrogated. Spectral Doppler was utilized  to evaluate flow at rest and with distal augmentation maneuvers in  the common femoral, femoral and popliteal veins.    COMPARISON:  None.    FINDINGS:  Common Femoral Vein: Common femoral vein is not compressible. There  is phases [place] of flow. There is nonocclusive thrombus.    Saphenofemoral Junction: Nonocclusive thrombus, noncompressible    Profunda Femoral Vein: Nonocclusive thrombus, noncompressible  Femoral Vein: Nonocclusive thrombus, noncompressible    Popliteal Vein: There is occlusive thrombus. The popliteal vein is  not compressible.    Calf Veins: Posterior tibial vein shows occlusive thrombus and is  not compressible.     IMPRESSION:    Extensive deep  venous thrombosis throughout the left lower  extremity, nonocclusive approximately but showing occlusive  appearance moredistally.    Electronically Signed    By: Skipper Cliche M.D.    On: 08/29/2013 21:10         Verified By: Rachael Fee, M.D.,  [place]:    02-Jun-15 15:15, GI Blood Loss Study - Nuc Med  PACS Image    02-Jun-15 19:30, Chest Portable Single View  PACS Image    03-Jun-15 07:03, Chest Portable Single View  PACS Image    07-Jun-15 13:06, GI Blood Loss Marlowe Aschoff Med  PACS Image    09-Jun-15 16:50, GI Blood Loss Marlowe Aschoff Med  PACS Image    14-Jun-15 09:10, Abdomen Flat and Erect  PACS Image    16-Jun-15 07:51, KUB - Kidney Ureter Bladder  PACS Image    16-Jun-15 13:12, Chest Portable Single View  PACS Image    16-Jun-15 21:58, CT Abdomen and Pelvis With Contrast  PACS Image    20-Jun-15 15:59, Abdomen Flat and Erect  PACS Image    15-Jul-15 20:33, [place] Color Flow Doppler Low Extrem Left (Leg)  PACS Image  CT:    16-Jun-15 21:58, CT Abdomen and Pelvis With Contrast  CT Abdomen and Pelvis With Contrast  REASON FOR EXAM:    (1) abscess; (2) abscess  COMMENTS:       PROCEDURE: CT  - CT ABDOMEN / PELVIS  W  - Jul 31 2013  9:58PM     CLINICAL DATA:  Vomiting. Small bowel dilatation on recent  radiographs.    EXAM:  CT ABDOMEN AND PELVIS WITH CONTRAST    TECHNIQUE:  Multidetector CT imaging of the abdomen and pelvis was performed  using the standard protocol following bolus administration of  intravenous contrast.  CONTRAST:  80 cc Isovue 300    COMPARISON:  Abdomen radiographs obtained earlier todayand abdomen  and pelvis CT dated 07/10/2007.    FINDINGS:  Postsurgical changes with right lower abdominal skin clips and  underlying edema in the subcutaneous fat. Multiple mildly to  moderately dilated small bowel loops with no transition point  identified. Large number of colonic diverticula without evidence of  diverticulitis. Distended  stomach. No free peritoneal fluid or air  is seen.    Atheromatous arterial calcifications. These include the coronary  arteries. Small to moderate-sized sliding hiatal hernia with  refluxed contrast. Proximal superior mesenteric artery stent. Small  amount of free peritoneal fluid. No enlarged lymph nodes.    Unremarkable liver, spleen, pancreas, gallbladder, adrenal glands,  kidneys, urinary bladder, uterus and ovaries. Mildly prominent  interstitial markings at the lung bases. Bilateral L5 pars  interarticularis defects with associated grade 1 spondylolisthesis  at the L5-S1 level. Old T12 and L1 vertebral compression deformity  and associated Schmorl's nodes. Lumbar lower thoracic spine  degenerative changes, including changes of DISH.     IMPRESSION:  1. Postoperative small bowel ileus without definite obstruction.  2. Small amount of ascites.  3. Small a moderate sized sliding hiatal hernia with  gastroesophageal reflux.  4. Extensive chronic diverticulosis.  5. Bilateral L5 spondylolysis with associated grade 1  spondylolisthesis at the L5-S1 level.  6. Mild chronic interstitial lung disease.      Electronically Signed    By: Richardson Landry  ReidM.D.    On: 07/31/2013 22:19         Verified By: Gerald Stabs, M.D.,  Nuclear Med:    02-Jun-15 15:15, GI Blood Loss Study - Nuc Med  GI Blood Loss Study - Nuc Med  REASON FOR EXAM:    Acute lower GI bleed.  COMMENTS:       PROCEDURE: NM  - NM GI BLOOD LOSS STUDY  - Jul 17 2013  3:15PM     CLINICAL DATA:  Actively bleeding since this morning.    EXAM:  NUCLEAR MEDICINE GASTROINTESTINAL BLEEDING SCAN    TECHNIQUE:  Sequential abdominal images were obtained following intravenous  administration of Tc-67mlabeled red blood cells.    RADIOPHARMACEUTICALS:  20.3 mCi Tc-946mn-vitro labeled red cells.  COMPARISON:  None.    FINDINGS:  Dynamic series shows early pooling of activity in the ascending  colon with subsequent  transit into the transverse and descending  colon.     IMPRESSION:  Source of active bleeding is seen within the ascending colon.      Electronically Signed    By: MeLorin Picket.D.    On: 07/17/2013 15:26     Verified By: MELuretha RuedM.D.,    07-Jun-15 13:06, GI Blood Loss Study - Nuc Med  GI Blood Loss Study - Nuc Med  REASON FOR EXAM:    Acute GI bleed, history of GI bleed  COMMENTS:       PROCEDURE: NM  - NM GI BLOOD LOSS STUDY  - Jul 22 2013  1:06PM     CLINICAL DATA:  History of recent colonic bleed.    EXAM:  NUCLEAR MEDICINE GASTROINTESTINAL BLEEDING SCAN    TECHNIQUE:  Sequential abdominal images were obtained following intravenous  administration of Tc-9975mbeled red blood cells.    RADIOPHARMACEUTICALS:  24.7 mCi Tc-69m8mvitro labeled red cells.  COMPARISON:  07/17/2013.    FINDINGS:  Dynamic seriesdemonstrates dispersion of activity  throughout the  blood pool, but demonstrates no activity within the small bowel or  colon on today's examination.     IMPRESSION:  1. No findings suggestive of acute gastrointestinal hemorrhage on  today's examination.      Electronically Signed    By: Vinnie Langton M.D.    On: 07/22/2013 14:38     Verified By: Etheleen Mayhew, M.D.,    09-Jun-15 16:50, GI Blood Loss Study - Nuc Med  GI Blood Loss Study - Nuc Med  REASON FOR EXAM:    acute lower GI bleeding.  Please evaluate.  COMMENTS:       PROCEDURE: NM  - NM GI BLOOD LOSS STUDY  - Jul 24 2013  4:50PM     ADDENDUM REPORT: 07/24/2013 16:58    ADDENDUM:  Study discussed by telephone with Dr. Abel Presto on 07/24/2013 at  16:58 .      Electronically Signed    By: Lars Pinks M.D.    On: 07/24/2013 16:58  CLINICAL DATA:  81 year old female with history of GI bleeding,  ascending colon source. Acute lower GI bleeding. Initial encounter.    EXAM:  NUCLEAR MEDICINE GASTROINTESTINAL BLEEDING SCAN    TECHNIQUE:  Sequential abdominal images were  obtained following intravenous  administration of Tc-71mlabeled red blood cells.    RADIOPHARMACEUTICALS:  21.1 mCi Tc-930mn-vitro labeled red cells.    COMPARISON:  07/22/2013 and 07/17/2013.    FINDINGS:  Normal blood pool activity. Late in the exam, active GI bleeding is  demonstrated in the right abdomen caudal to the liver, corresponding  to the same area implicated on 0650/72/2575The focus of abnormal  radiotracer accumulation does slightly migrate toward the midline  (transverse colon) by the final images of the exam.     IMPRESSION:  Positive for active, recurrent ascending colon region bleeding.    Electronically Signed:  By: LeLars Pinks.D.  On: 07/24/2013 16:53         Verified By: HAGwenyth BenderHALL, M.D.,   ASSESSMENT AND PLAN:  Assessment/Admission Diagnosis extensive LLE DVT Recent GI bleed and surgery, high risk for anticoagulation HTN   Plan Agree with IVC filter placement for her extensive DVT with recent GI bleed.  Discussed with patient. Will do today   level 4   Electronic Signatures: DeAlgernon HuxleyMD)  (Signed 18-Jul-15 11:35)  Authored: Chief Complaint and History, PAST MEDICAL/SURGICAL HISTORY, ALLERGIES, HOME MEDICATIONS, Family and Social History, Review of Systems, Physical Exam, LABS, RADIOLOGY, Assessment and Plan   Last Updated: 18-Jul-15 11:35 by DeAlgernon HuxleyMD)

## 2014-06-08 NOTE — Consult Note (Signed)
Pt has not had a bowel movement since admission, was given a pill this afternoon to help that.  She cleaned her supper plate today.  No abd pain, no nausea or vomiting.  Discussed case with Dr. Gilda CreaseSchnier and feel she should be on asprin 81mg  a day to help protect stent but not enough to cause recurrent bleeding.  Dr. Servando SnareWohl on this weekend but will not see unless called.  Electronic Signatures: Scot JunElliott, Carsten Carstarphen T (MD)  (Signed on 05-Jun-15 17:17)  Authored  Last Updated: 05-Jun-15 17:17 by Scot JunElliott, Heath Tesler T (MD)

## 2014-06-08 NOTE — Op Note (Signed)
PATIENT NAME:  Kerri Ramirez, Lacosta V MR#:  [id number] DATE OF BIRTH:  09-20-1925  DATE OF PROCEDURE:  07/17/2013  PREOPERATIVE DIAGNOSIS: Gastrointestinal bleed.   POSTOPERATIVE DIAGNOSES:  1. Gastrointestinal bleed.  2. 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   PROCEDURES PERFORMED:  1. Abdominal aortogram.  2. Introduction of catheter into the right colic artery and its selected branches, 3rd order catheter placement.  3. Introduction of catheter into the ileocolic artery and its associated branches, additional 3rd order catheter placement.  4. Microbead embolization, right colic artery and its branches.  5. Microbead embolization of the ileocolic artery and its branches.  6. Percutaneous transluminal angioplasty and stent placement using a Herculink 6 x 15 stent, the origin of the SMA.  7. StarClose closure of the arterial puncture site, right common femoral artery.   PROCEDURE PERFORMED BY: Levora DredgeGregory Schnier, MD    SEDATION: Versed 4 mg plus fentanyl 150 mcg administered IV. Continuous ECG, pulse oximetry, and cardiopulmonary monitoring were performed throughout the entire procedure by the interventional radiology nurse.  Total sedation time was 1 hour, 20 minutes.   ACCESS: A 6-French sheath, right common femoral artery.   FLUOROSCOPY TIME: 13.4 minutes.   CONTRAST USED: Isovue 65 mL.   COMPLICATIONS:   Patient did have an emesis during the procedure, which was identified quickly, and a suction aspiration of the fluids was performed. During this procedure, the patient   never experienced a decrease in her saturations, which remained greater than 96% the entire time on 2 liters nasal cannula.   INDICATIONS: This patient is an 81 year old woman who presented earlier today with a lower GI bleed. Her initial hemoglobin was approximately 9, and at the time I was contacted, she had had multiple further bowel movements, positive nuclear  scan for the ascending colon, appeared to be in the cecal or proximal right colonic area, and subsequently, her hemoglobin is recorded at 7. I was contacted regarding embolization to try to prevent further exsanguinating hemorrhage. Risks and benefits were discussed with the family. All questions were answered. The patient has agreed to proceed.   DESCRIPTION OF PROCEDURE: The patient is taken to special procedures and placed in the supine position. After adequate sedation has been achieved, she is positioned supine and her right groin is prepped and draped in a sterile fashion. Ultrasound is placed in a sterile sleeve. Ultrasound is utilized secondary to lack of appropriate landmarks and to avoid vascular injury. Under direct ultrasound visualization, the common femoral artery is identified. There does appear to be a fairly high bifurcation of the profunda femoris and the superficial femoral, which was confirmed in the closure shot, also a significant posterior plaque in the distal common femoral which was observed on ultrasound as well.  Therefore, the puncture is performed in the more proximal common femoral. This was confirmed on the end obliquity for the StarClose.   Microwire followed by microsheath is then inserted. J-wire followed by a 6-French sheath and a 5-French pigtail catheter. Pigtail catheter is positioned at the level of T10 and AP projection of the aorta is obtained. There is almost no visualization of the SMA; however, the celiac is identified. The pigtail catheter is repositioned slightly to above the celiac and a true lateral is obtained.  True lateral suggests a string sign in the origin of the SMA. The celiac appears to be widely patent.   Given the patient's bleeding in spite of the narrowing of the SMA, no heparin was  utilized throughout this case.   A floppy Glidewire, VS1 catheter, and a back-loaded Lima guiding catheter were then advanced. A VS1 reformed and subsequently engaged.  The SMA guiding catheter was then advanced up to the wall of the aorta or the ostia of the SMA; however the VS1 would not track. Several attempts were made. Ultimately, after re-engaging the ostia several times, the Glidewire was advanced fairly distally. The VS1 was removed and a straight glide catheter was advanced and this did track down into the more distal SMA. Subsequently, the floppy Glidewire was exchanged for a Magic torque wire and the catheter removed. A Kumpe catheter was then advanced over the Magic torque wire and positioned in the mid SMA. The detector was then repositioned to AP and imaging of the SMA was undertaken. Using a combination of the Magic torque wire, a Glidewire, and a prograde catheter wire combination, first the middle colic was engaged and injection of contrast demonstrated a clear blush and a branch that seemed to be the dominant source of the blush was selected with the prograde and then a half milliliter of beads was instilled. The ileocolic was then selected and one of its distal branches coursed again toward the right colon. This was selectively imaged with hand injection of contrast and then approximately 1 mL of beads was instilled. Following this, the Magic torque wire was reintroduced, the straight catheter was reintroduced, and the LIMA catheter was advanced again to the origin, as it would not track across, and magnified images of the ostia of the SMA were obtained. String sign is noted and measurements are made. The SMA appears to be slightly greater than 6, measuring 6.3 mm, and therefore, a 6 x 15 Herculink stent was selected. The 0.014 wire exchanged through the Kumpe catheter and several more magnified images were obtained and the stent was deployed protruding into the aorta by approximately 3 mm. The entire lesion is covered.  The stent is clearly well opposed to the wall and there is now wide patency of the ostia of the SMA.   The catheter wire is removed from the  visceral circulation and the guiding catheter is removed.  A J-wire is advanced through the 6-French sheath. Oblique view of the right groin is obtained, and as noted above, the stick is in the proximal common femoral.  It is below the circumflex vessels. In the distal femoral, there is bulky calcific plaque and there is a very high bifurcation of the SFA and profunda. This is a well-placed stick which is amenable to closure and a StarClose device was deployed without difficulty. During the procedure, as noted above, the patient did vomit. This was rapidly suctioned. It does not appear that she aspirated, although this is clearly a concern given her overall age, medical condition, and supine position, and the medical service was notified and this was discussed in detail.   INTERPRETATION: The aorta is opacified with a bolus injection of contrast. In the AP projection, the celiac is easily identified whereas the SMA is clearly not identified. On the true lateral, there appears to be a string sign in the proximal SMA at its origin. Celiac is widely patent.   Once the SMA is engaged, the catheter is negotiated down into the right colic and the ileocolic and selectively microbeads are instilled. At this point on the selective imagings, there was clearly a significant blush suggesting a rather profound hemorrhage. This correlated clinically with her frequent bowel movements and her significant drop  in her hemoglobin, and therefore given this situation, I elected to place beads recognizing ischemic colitis may be the source of the bleeding. Hopefully, by stopping the hemorrhage, we can continue to resuscitate the patient, and if she does need right hemicolectomy, we will be in a better position then emergently taking her to the operating room. Having also identified that there is a high-grade stenosis at the origin of the SMA, clearly since hemicolectomy does appear to be a fairly plausible outcome, I elected to  angioplasty and stent the SMA which would allow for appropriate surgery to be performed and improve healing of the colon.   SUMMARY: Successful embolization of a right ascending colonic bleed in association with angioplasty and stent placement for a critical ostial SMA stenosis.    ____________________________ Renford Dills, MD ggs:dd D: 07/17/2013 19:49:49 ET T: 07/17/2013 20:46:27 ET JOB#: [id number]  cc: Renford Dills, MD, <Dictator> Scot Jun, MD Claude Manges, MD Leitha Bleak. Lin Givens, MD Renford Dills MD ELECTRONICALLY SIGNED 07/31/2013 9:19

## 2014-06-08 NOTE — Consult Note (Signed)
PATIENT NAME:  Kerri Ramirez, [name] MR#:  [id number] DATE OF BIRTH:  [birth year]  DATE OF CONSULTATION:  07/17/2013  REFERRING PHYSICIAN:  Dr. Cherlynn KaiserSainani CONSULTING PHYSICIAN:  Lynnae Prudeobert Elliott, M.D., Ranae PlumberKimberly A. Arvilla MarketMills, ANP (Adult Nurse Practitioner)  REASON FOR CONSULTATION:  Lower gastrointestinal bleed.   HISTORY OF PRESENT ILLNESS:  This 81 year old patient has a known history of tubular adenomas, diverticulosis, and history of hiatal hernia and gastritis.  Most recent colonoscopy and upper endoscopy were performed 07/09/2008.  The patient reports she has been in good state of health.  She denies any problems with her stomach until yesterday when she started to pass fresh blood.  She had multiple episodes of rectal bleeding and thought it would stop so she did not mention it to the husband.  The patient says she has passed blood too many times to count.  She was brought to the Emergency Room for this problem today, with admitting hemoglobin 9.1.  She has had no abdominal pain, nausea or vomiting.  She denies chest pain, shortness of breath or dizziness or lightheadedness or weakness.  The patient had a GI bleeding scan that was positive for GI bleed localized to the ascending colon. She continues to pass large amounts of frech red blood- splattering in commode.  Dr. Gilda CreaseSchnier has seen her and plans to do an emergent embolization procedure.  The patient says she is feeling pretty well.  She is jovial.  Color is pale, she has been placed on oxygen and a stat hemoglobin has been ordered.  She has been typed and crossmatched.   PAST MEDICAL HISTORY: 1.  Hypertension.  2.  Osteoporosis.  3.  Tubular adenomatous colon polyps.  4.  Gastritis.  5.  Iron deficiency anemia.  6.  Chronic kidney disease.  7.  Hiatus hernia, gastritis, previous gastric polyp.   PAST SURGICAL HISTORY:  None listed.   MEDICATIONS ON ADMISSION:  1.  Multivitamin daily.  2.  Fish oil 1000 mg twice daily.  3.  Lisinopril 20 mg  daily.  4.  Meloxicam 15 mg daily.   FAMILY HISTORY:  Positive for heart disease in father, mother deceased when the patient was 87eight months old from complication of mastoiditis.   PHYSICAL EXAMINATION: VITAL SIGNS:  97.7, 96, 20, 143/53.  Pulse ox room air is 98%.  GENERAL:  Well-nourished, elderly Caucasian female alert and oriented, jovial.  Color is markedly pale from baseline.  HEENT:  Conjunctivae is very pale.  Sclerae is anicteric.  HEENT:  Head is normocephalic.  NECK:  Supple.  Trachea is midline.  CARDIAC:  S1, S2 without murmur, rub or gallop.  LUNGS:  CTA anteriorly.  Respirations are nonlabored.  ABDOMEN:  Soft, obese, nontender in all quadrants.  RECTAL:  Deferred.  Fresh bright red blood noted in the commode.  EXTREMITIES:  Without edema, cyanosis.  Positive pallor.  Weak distal pulses noted.  No skin mottling.  Extremities are warm.  NEUROLOGIC:  Grossly intact, follows commands.  MUSCULOSKELETAL:  Has been up to the bathroom recently with assistance, had a little lightheadedness just recently.   ADMISSION LABORATORY STUDIES THIS MORNING:  With hemoglobin 9.1, MCV 92.0, elevation in BUN of 47, creatinine 1.61, potassium 5.2, albumin 3.2.  Normal liver panel, troponin negative, less than 0.02.  WBC 11.9, platelet count 293.    Two units PRBC is ready.  Stat hemoglobin is pending.   RADIOLOGY STUDIES:  GI bleed scan just completed shows early pooling of activity in the ascending colon with  subsequent transit into the transverse and descending colon.   IMPRESSION:  The patient presents with an aggressive painless lower gastrointestinal bleed most certainly related to diverticular disease.  It is nontender, without pain to rule against ischemic colitis.  She has had no change in bowel habits prior to this, no antibiotics, nuts or seeds or change in medication or diet.  The patient is up to date with colonoscopy with most recent study performed for follow-up adenomatous colon  polyps on 07/09/2008, notable for multiple small and largemouth diverticula in the sigmoid colon, descending colon, transverse colon and ascending colon.  She had three small polyps and internal hemorrhoids.   PLAN:  Agree with plans for emergent vascular intervention with plan for embolization.  Anticipate need for blood transfusion.  Dr. Mechele Collin into the bedside to evaluate the patient in collaboration of care.   Thank you for this consultation.    The patient was given additional oxygen therapy, stat hemoglobin pending with the anticipated need for transfusion as the patient's physical appearance suggests that her hemoglobin is much lower than 9 range.  Close monitoring of hemoglobin, and she may need to have Intensive Care Unit admission and central line.   These services provided by Cala Bradford A. Arvilla Market, MS, APRN, BC, ANP, under collaborative agreement with Scot Jun, M.D.  ____________________________ Ranae Plumber. Arvilla Market, ANP (Adult Nurse Practitioner) Julianne Rice D: 07/17/2013 16:28:00 ET T: 07/17/2013 17:02:51 ET JOB#: [id number]  cc: Cala Bradford A. Arvilla Market, ANP (Adult Nurse Practitioner), <Dictator> Ranae Plumber Suzette Battiest, MSN, ANP-BC Adult Nurse Practitioner ELECTRONICALLY SIGNED 07/17/2013 19:08

## 2014-06-08 NOTE — Discharge Summary (Signed)
PATIENT NAME:  Kerri Ramirez, Kerri Ramirez MR#:  [id number] DATE OF BIRTH:  05-10-1925  DATE OF ADMISSION:  08/29/2013. DATE OF DISCHARGE:  09/02/2013.  DISCHARGING PHYSICIAN:  Enid Baas, M.D.   PRIMARY CARE PHYSICIAN: Francia Greaves, MD.   CONSULTATIONS IN THE HOSPITAL:  Vascular consultation by Dr. Festus Barren.   DISCHARGE DIAGNOSES:  1.  Left lower extremity acute deep vein thrombosis.  2.  History of diverticulosis, and gastrointestinal bleed status post right hemicolectomy.  3.  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  4.  Chronic iron-deficiency anemia.  5.  History of Clostridium difficile colitis.  6.  Recent Klebsiella urinary tract infection and sepsis.   HOME MEDICATIONS:  1.  Fish oil 1000 mg capsule p.o. b.i.d.  2.  Tylenol 650 mg p.o. q.4 hours p.r.n. for pain or fever.  3.  Iron polysaccharide 150 mg 1 capsule p.o. b.i.d.  4.  Protonix 40 mg p.o. daily.  5.  Colcrys 0.6 mg p.o. daily.  6.  Coumadin 3 mg p.o. daily at 11 a.m.  7.  Lovenox 80 mg subcutaneously every 12 hours for 4-5 days depending upon the INR.  8.  Ensure, chocolate flavor, 240 mL 3 times a day with meals.   DISCHARGE DIET: Low-sodium diet.   DISCHARGE ACTIVITY: As tolerated.   FOLLOWUP INSTRUCTIONS:   1.  PT-INR check in 2-3 days, with goal INR 2-2.5. Please stop the Lovenox if INR reaches goal. 2.  Check hemoglobin level in 3 days.  3.  Hold Lovenox and Coumadin for any active bleeding or if hemoglobin drops to less than 7.  4.  Follow up with PCP in 1 week.  5.  Follow up with vascular in 4 weeks.  6.  Physical therapy.   LABORATORY DATA AND IMAGING STUDIES PRIOR TO DISCHARGE: WBC 6.8, hemoglobin 8.5, hematocrit 25.8, platelet count 322,000. INR is 1.0.  Sodium 139, potassium 4.4, chloride 112, bicarbonate 18, BUN 15, creatinine 1.04, glucose 92 and calcium of 8.6.   Stool guaiac is positive.   Lower extremity  ultrasound showing extensive throughout the left lower extremity, nonocclusive but showing occlusive appearance more distally. It is spreading from calf veins to the popliteal vein, profunda femoral-saphenofemoral junction and the common femoral vein on the left side.   BRIEF HOSPITAL COURSE: [name] is an 81 year old, very pleasant Caucasian female with past medical history significant for history of GI bleed. She had a diverticular bleed for which she needed recent right hemicolectomy and history of ischemic colitis secondary to SMA stenosis, which required a stent a couple of months ago. She also had a recent hospitalization for C. difficile colitis and sepsis from Klebsiella UTI and bacteremia. She was discharged to [facility], however, was brought back secondary to left lower extremity swelling.  She was noted to have a deep venous thrombosis on ultrasound.  1.  Left lower extremity extensive deep vein thrombosis. The patient a very significant GI bleed risk based on her recent history, so she was started on IV heparin without any active bleeding. Since she did okay with the IV heparin for a day she was started on oral Coumadin to treat the DVT for at least 3 to 6 months because of significant clot burden and also for symptomatic management. Once the IV heparin was started her pain and swelling in the leg have started to improve. She was not being tried on the newer anticoagulation agents as they cannot be reversed  immediately in case of any life-threatening bleeding, especially with her history of significant GI bleeds, so she is being started on the Coumadin and being bridged by IV heparin in the hospital and will be bridged with Lovenox as an outpatient. Her goal INR could be kept low between 2 to 2.5 and INR needs to be checked frequently along with the hemoglobin to make sure she is not losing blood. She was also seen by vascular for an IVC filter and had the filter placed on September 01, 2013 in case she  needs to come off Coumadin earlier than 3 months. All the risks and management plan have been explained to the patient and her daughter who are agreeable with the current treatment. The patient will continue her iron supplements and will be [facility].   Her course has been otherwise uneventful in the hospital.   CODE STATUS: Full code.   DISCHARGE CONDITION: Stable.   DISCHARGE DISPOSITION: Short-term rehabilitation[facility].   Time Spent on discharge: 45 minutes.    ____________________________ Enid Baasadhika Raymond Azure, MD rk:lt D: 09/02/2013 10:46:46 ET T: 09/02/2013 11:26:03 ET JOB#: [id number]  cc: Enid Baasadhika Ivalene Platte, MD, <Dictator> Cheryl L. Lin GivensJeffries, MD Enid BaasADHIKA Daltin Crist MD ELECTRONICALLY SIGNED 09/05/2013 13:29

## 2014-06-08 NOTE — H&P (Signed)
PATIENT NAME:  Kerri Ramirez, Kerri Ramirez MR#:  [id number] DATE OF BIRTH:  1925-11-26  DATE OF ADMISSION:  08/29/2013  REFERRING PHYSICIAN: Kathreen Devoid. Paduchowski, MD.    PRIMARY CARE PHYSICIAN: Cheryl L. Lin Givens, MD.  CHIEF COMPLAINT: Edema of the left lower extremity.   HISTORY OF PRESENT ILLNESS: This is a very nice 81 year old female with previous hospitalization here  on 07/22/2013 due to rectal bleeding. The patient actually remained hospitalized until June 23rd. Her discharge diagnoses were acute gastrointestinal bleeding, recurrent right colon diverticular bleed, acute blood loss anemia, partial small bowel obstruction with ileus. She had also sepsis due to Klebsiella pneumonia bacteremia, due to her urinary tract infection and also Clostridium difficile colitis.   The patient also had acute renal failure. She had a right colectomy, was treated with Flagyl for her C. difficile and previous to that, was treated with Levaquin for the Klebsiella and that is likely how she got the C. difficile.  Now all these  issues have resolved.   The patient has not had any significant GI bleeding and she was discharging to assisted living facility for physical therapy. Apparently, she has not been ambulating much, but physical therapy has continued as much as she could tolerate. She was going to be discharged in the next couple of days, although within the last couple of days, the daughter noticed that she had increasing edema of the left lower extremity.   The patient did not notice it.  She was told that she had it and she did not have any significant pain, weakness or numbness of the lower extremity.   Also, I have to mention that the patient had multiple episodes of GI bleeding for which she underwent a PTA and stent placement of the mesenteric artery, after diagnosis of stenosis of the superior mesenteric artery that was prior to this last hospitalization.   Overall, the patient is admitted today with a  history of DVT, previous GI bleeding.  At this moment, since the patient had a hemicolectomy, it is likely that she will be okay tolerating her anticoagulation.  The patient is taking Colcrys, but she denies any history of gout.   PAST MEDICAL HISTORY: 1.  Gastrointestinal bleeding.  2.  Superior mesenteric artery stenosis.  3.  Diverticular bleeding.  4.  Osteoarthritis.  5.  Hypertension, not on medications.  6.  History of Klebsiella UTI.  7.  History of C. difficile. 8.  History of sepsis and bacteremia due to Klebsiella.  9.  History of partial small bowel obstruction after surgery.  10.  Acute blood loss anemia.   PAST SURGICAL HISTORY:  1.  Superior mesenteric artery stent placement.  2. Extended-release right hemicolectomy due to massive lower gastrointestinal bleed, secondary to diverticulosis.   ALLERGIES: Not known drug allergies.   SOCIAL HISTORY: The patient does not smoke, does not drink, does not use drugs.  She was living at home with her husband, but recently was discharged into [facility]  and now she is ready to be discharged in the next couple of days.   FAMILY HISTORY: Positive for father with heart disease.  Mother  died from apparent  natural causes.   CURRENT MEDICATIONS:  Protonix 40 mg once daily, iron 150 mg twice daily, fish oil 1000 mg once daily, Colcrys 0.6 mg once daily, Tylenol 325 mg daily.  REVIEW OF SYSTEMS:  Twelve system review of systems is done. CONSTITUTIONAL:  No fever, fatigue, weakness.  EYES: No blurry vision, double vision.  ENT:  No difficulty swallowing, tinnitus.  RESPIRATORY: No shortness of breath or wheezing.  CARDIOVASCULAR: No chest pain or orthopnea.  GASTROINTESTINAL: No nausea, vomiting, diarrhea. History of CVA now resolved and treated.  History of GI  bleeding, now resolved and treated.   GENITOURINARY: No dysuria, hematuria, changes in frequency.  GYNECOLOGIC: No breast masses.  ENDOCRINE: No polyuria, polydipsia.   HEMATOLOGIC AND LYMPHATIC: No anemia or easy bruising.  MUSCULOSKELETAL: No significant neck pain or back pain.  SKIN: No rashes or petechiae.  NEUROLOGIC: No numbness or weakness.  PSYCHIATRIC: No insomnia or depression.   PHYSICAL EXAMINATION: VITAL SIGNS: Blood pressure 123/65, pulse 104, respirations 19, temperature 98.5, oxygen saturation 100% on room air.  GENERAL: Alert and oriented x3 in no acute distress. No respiratory distress. Hemodynamically stable.  HEENT: Pupils are equal and reactive. Extraocular movements are intact. Mucosa are moist. Anicteric sclerae. Pink conjunctivae. No oral lesions. No oropharyngeal exudates.  NECK: Supple. No JVD. No thyromegaly. No adenopathy. No carotid bruits.  CARDIOVASCULAR: Regular rate and rhythm. No murmurs, rubs, or gallops.  LUNGS: Clear without any wheezing or  No use of accessory muscles.  ABDOMEN: Soft, nontender, nondistended. No hepatosplenomegaly. No masses. Bowel sounds are positive. Incision from previous GI surgery healed.  GENITAL:  No external lesions.  No ulcers  on sacral area.  EXTREMITIES: Positive edema of the left lower extremity, unilateral significant difference when compared to the right lower extremity. Pulses +2. Capillary refill less than 3.  SKIN: No rashes or lesions. There is mild erythema on the left lower extremity when compared with the right, but no significant cellulitic changes.  NEUROLOGIC: Cranial nerves II-XII intact.  PSYCHIATRIC: No agitation or depression.  LYMPHATIC: Negative for lymphadenopathy in cervical, neck or supraclavicular areas.   LABORATORY RESULTS: Ultrasound Doppler of the lower extremities shows extensive deep vein thrombosis to the left lower extremity nonocclusive, approximately but showing occlusive appearance more distally. Creatinine is 2.16, previous creatinine was 1.01, glucose 123, BUN 29, sodium 130. LFTs within normal limits. White blood count is 14,000, her white blood count has  been persistently elevated since her previous admission in the 14,000s.  Her hemoglobin is 9.8. Previous hemoglobin was 7.2, platelet count is 275,000.  INR is 1.1.   ASSESSMENT AND PLAN: An 81 year old female with history of GI bleeding secondary to diverticulitis.  She had a prolonged hospitalization, but the bleeding resolved after surgical procedure/extensive right hemicolectomy.  1.  Deep vein thrombosis. The patient is admitted for treatment of this condition. The patient has history of severe GI bleeding that happened due to diverticulitis in the past. She had multiple episodes that were secondary to this condition. She has had stenosis of the mesenteric artery, which was corrected with stent.  So, since she is status post right hemicolectomy, it is likely that the source of the bleeding is removed, for what we are going to risk starting anticoagulation with heparin. The patient had a good, benign recovery this moment for what her incision is healed. No signs of infections. I think that she will be able to tolerate her anticoagulation at the moment. For now, we are going to start with heparin and monitor her closely. Check guaiac on the stools and if she is tolerating the heparin, consider long term anticoagulation with possibly Coumadin. If the patient has any significant bleeding or problems, consider the possibility of an inferior vena cava filter. For the moment the patient is stable. No chest pain. No shortness of breath. Monitor under remote telemetry.  Check hemoglobin in the morning.  2.  Hypertension. The patient has been taken off blood pressure medications. She is to take lisinopril and she was taken off due to acute kidney injury. At this moment, she has acute kidney injury again. 4.  Acute kidney injury. This is likely secondary to intravascular volume depletion. We are going to hold any nephrotoxins and give her some IV fluids gently.  5.  History of previous gastrointestinal bleeding as  mentioned above.  This was already corrected surgically.   6.  Gastrointestinal prophylaxis with Protonix.  7.  The patient is a full code.   TIME SPENT: I spent about 45 minutes with this admission.   ____________________________ Felipa Furnace, MD rsg:ds D: 08/29/2013 22:14:31 ET T: 08/29/2013 23:26:27 ET JOB#: [id number]  cc: Felipa Furnace, MD, <Dictator> Rogerick Baldwin Juanda Chance MD ELECTRONICALLY SIGNED 08/30/2013 23:15

## 2014-06-08 NOTE — Consult Note (Signed)
Pt with symptoms suggesting acute biliary obst but TB not up while ALT and AST soared over night.  Possible early ascending cholangitis.  Agree with coverage with broad spectrum antibiotics while things are being sorted out.  to get CT scan tonight, if no help in pin pointing the diagnosis consider MRCP tomorrow.  See full note by NP, Fransico SettersKim Mills, case discussed with her and Dr. Juliann PulseLundquist.   Electronic Signatures: Scot JunElliott, Ajahnae Rathgeber T (MD) (Signed on 02-Jun-15 18:02)  Authored   Last Updated: 02-Jun-15 19:42 by Scot JunElliott, Hulbert Branscome T (MD)

## 2014-06-08 NOTE — Consult Note (Signed)
Pt doing well, no signif complaints.  No bleeding , no abd pain, eating diet well.  Abd non tender, chest clear.  Hgb fluctuates at this place so main thing is no bleeding. I will sign off, reconsult if needed.   Electronic Signatures: Scot JunElliott, Bernabe Dorce T (MD)  (Signed on 04-Jun-15 15:28)  Authored  Last Updated: 04-Jun-15 15:28 by Scot JunElliott, Darcell Yacoub T (MD)

## 2014-06-08 NOTE — Consult Note (Signed)
Brief Consult Note: Diagnosis: GI bleed.   Patient was seen by consultant.   Recommend to proceed with surgery or procedure.   Comments: patient with active GI bleed localized to the ascending colon  will plan embolization emergently.  Electronic Signatures: Levora DredgeSchnier, Neri Samek (MD)  (Signed 02-Jun-15 16:01)  Authored: Brief Consult Note   Last Updated: 02-Jun-15 16:01 by Levora DredgeSchnier, Mariesa Grieder (MD)

## 2014-06-08 NOTE — Discharge Summary (Signed)
PATIENT NAME:  Kerri Ramirez, Kerri Ramirez MR#:  [id number] DATE OF BIRTH:  [birth year]  DATE OF ADMISSION:  07/17/2013 DATE OF DISCHARGE:  07/21/2013  For a detailed note, please take a look at the history and physical on admission.   DIAGNOSES AT DISCHARGE: As follows:  1. Acute lower gastrointestinal bleed secondary to ischemic colitis. Status post microembolization of the superior mesenteric artery and stent placement.  2. Hypertension.  3. Gastroesophageal reflux disease.   DIET: The patient is being discharged on a low-sodium, regular consistency diet.   ACTIVITY: As tolerated.   FOLLOW-UP: Dr. Francia Greaves and Dr. Levora Dredge and Dr. Juliann Pulse in the next 1 to 2 weeks.   DISCHARGE MEDICATIONS: Lisinopril 20 mg daily, meloxicam 15 mg daily, fish oil 1000 mg b.i.d., Centrum multivitamin daily, aspirin 81 mg daily.   CONSULTANTS DURING THE HOSPITAL COURSE: Dr. Levora Dredge from vascular surgery, Dr. Lynnae Prude gastroenterology, Dr. Ida Rogue from general surgery.   PERTINENT STUDIES DONE DURING THE HOSPITAL COURSE: Are as follows: A chest x-ray done on on 06/02 showing hazy airspace disease throughout the left lung. A nuclear medicine GI bleeding scan done 06/02 showing source of active bleeding is seen within the ascending colon.   HOSPITAL COURSE: This is an 81 year old female with medical problems as mentioned above, presented to the hospital with bright red blood per rectum and rectal bleeding.    1. GI bleed. The patient presented with rectal bleeding. It was likely a lower gastrointestinal bleed. Initially, the patient was admitted to the medical floor and placed on q.6 hours hemoglobin checks as her hemoglobin on admission was not low enough for transfusion. The patient continued to have bleeding, underwent a bleeding scan which was positive, therefore, a vascular surgery consult was obtained. The patient was seen by Dr. Gilda Crease would ended up doing an aortogram  and the patient was noted to have a critical superior mesenteric artery stenosis to which she had angioplasty and a stent placed. She also had bleeding from the distal vessels coming off the superior mesenteric into which there were beads placed and embolization done. Post microembolization and angioplasty the patient's bleeding did stop and improve. The patient was transfused a total of 3 units of packed red blood cells while in the hospital and hemoglobin has remained stable since then. The patient's diet was slowly advanced from a clear liquid eventually to a regular diet, which she is tolerating with no further evidence of bleeding.  2. Acute renal failure. This was likely secondary to the GI bleed and ongoing volume loss. After getting some blood and IV fluids her renal function is now back down to baseline. Her ACE inhibitor was initially held in the hospital due to acute renal failure. Although it has been resumed upon discharge.  3. Hypertension. The patient's antihypertensives were held initially while she was in the hospital as due to gastrointestinal bleed, although she can resume her home medications as her hemodynamics have improved and her bleeding has resolved.  4. Osteoarthritis. The patient's meloxicam was held although she will resume that upon discharge.   CODE STATUS: The patient is clinically hemodynamically stable, has no evidence of further hematochezia or rectal bleeding. Therefore, she is being discharged home since he had a stent placed to the superior mesenteric artery. There was a discussion about possibly putting her on Plavix but as per gastroenterology this would not be advisable at this time; therefore, the patient was discharged on a baby aspirin presently.   The patient  is a full code.   TIME SPENT: 40 minutes.    ____________________________ Rolly PancakeVivek J. Cherlynn KaiserSainani, MD vjs:sg D: 07/22/2013 08:15:21 ET T: 07/22/2013 09:12:53 ET JOB#: [id number]  cc: Rolly PancakeVivek J. Cherlynn KaiserSainani, MD,  <Dictator> Cheryl L. Lin GivensJeffries, MD Renford DillsGregory G. Schnier, MD Si Raiderhristopher A. Juliann PulseLundquist, MD   Houston SirenVIVEK J SAINANI MD ELECTRONICALLY SIGNED 08/14/2013 20:21

## 2014-06-08 NOTE — H&P (Signed)
PATIENT NAME:  Kerri Ramirez, Kerri Ramirez MR#:  [id number] DATE OF BIRTH:  [birth year]  DATE OF ADMISSION:  07/22/2013  PRIMARY CARE PHYSICIAN: Dr. Lorie PhenixNancy Maloney.  REFERRING EMERGENCY ROOM PHYSICIAN: Dr. York CeriseForbach.  CHIEF COMPLAINT: Rectal bleeding.   HISTORY OF PRESENT ILLNESS: The patient is an 81 year old pleasant Caucasian female who was just recently admitted to the hospital on June 2nd with multiple episodes of bleeding and was diagnosed with a stenosis of superior mesenteric artery and had PTA and stent placement done by Dr. Lorretta HarpSchneir, was discharged home yesterday evening at around 4:00 p.m. The patient did not had a bowel movement yesterday prior to her discharge. After she went home, she had a bowel movement with black, tarry stool initially. Following that, she had approximately 4 to 5 large bowel movements with fresh blood without any stool in it. The patient started feeling dizzy and she is brought into the ER. Hemoglobin is at around 9 and patient is reporting of generalized abdominal cramping just before having a bloody bowel movement. Denies any chest pain, shortness of breath. Denies any nausea, vomiting. No fevers. No other complaints.   PAST MEDICAL HISTORY: Consistent with hypertension, osteoarthritis, recent history of lower GI bleed.   PAST SURGICAL HISTORY: Superior mesenteric artery stent placement.   ALLERGIES: No known drug allergies.   PSYCHOSOCIAL HISTORY: Lives at home with husband. No history of smoking, alcohol, or illicit drug usage.   FAMILY HISTORY: Mother and father are both deceased. Father died, deceased from complications of the heart disease. Father died when she was 598 months old.  REVIEW OF SYSTEMS:  CONSTITUTIONAL: Denies any fever or fatigue. No weight gain or weight loss.  EYES: Denies blurry vision, double vision.  ENT: Denies epistaxis or discharge, postnasal drip and tinnitus. RESPIRATORY: Denies cough, COPD. Denies any wheezing or hemoptysis.   CARDIOVASCULAR: No chest pain, palpitations. Denies any syncope.  GASTROINTESTINAL: Denies nausea, vomiting. Has rectal bleeding with fresh blood.   GENITOURINARY: No dysuria or hematuria. ENDOCRINE: Denies polyuria, nocturia, or thyroid problems. No heat or cold intolerance.  HEMATOLOGIC AND LYMPHATIC: No anemia, easy bruising, bleeding other than the rectal bleeding.  INTEGUMENTARY: No acne or rash, lesions.  MUSCULOSKELETAL: Denies any joint pain. Denies any gout. No arthritis.  NEUROLOGIC: Denies vertigo, ataxia. Denies any seizure activity.  PSYCHIATRIC: Denies any anxiety, insomnia, OCD.  CURRENT MEDICATIONS: Multivitamins once daily, fish oil 1000 mg p.o. b.i.d., lisinopril 20 mg once daily.  PHYSICAL EXAMINATION:  VITAL SIGNS: All vital signs are stable. Patient is afebrile. Respiratory rate is 18, pulse 80 per minute, pulse ox is 95% to 96% on room air.  GENERAL APPEARANCE: Not in acute distress. Moderately built and nourished.  HEENT: Normocephalic, atraumatic. Pupils are equally reacting to light and accommodation. No scleral icterus. No conjunctival injection. No sinus tenderness. No postnasal drip. Moist mucosal membranes.  NECK: Supple. No JVD. No thyromegaly. Range of motion is intact.  LUNGS: Clear to auscultation bilaterally. No accessory muscle usage. There is no anterior chest wall tenderness on palpation.  CARDIAC: S1 and S2 normal. Regular rate and rhythm. No murmurs.  GASTROINTESTINAL: Soft. Bowel sounds are positive in all 4 quadrants. Nontender, nondistended. No hepatosplenomegaly. No masses felt. Complaining of cramping just prior to bowel movement.  NEUROLOGIC: Awake, alert, oriented x 3. Cranial nerves II-XII are grossly intact. Motor and sensory are intact. Reflexes are 2+.  EXTREMITIES: No edema. No cyanosis. No clubbing.  SKIN: Warm to touch. Normal turgor. No rashes, no lesions.  MUSCULOSKELETAL: No joint effusions,  tenderness, or edema.  PSYCHIATRIC: Normal  mood and affect.   LABORATORIES AND IMAGING STUDIES: A 12-lead EKG: Normal sinus rhythm at 84 beats per minute, no acute ST-T wave changes. Normal PR and QRS interval. Hemoglobin is at 9.1. The rest of the labs will be dictated later as systems are down. I cannot retrieve labs at this time.   ASSESSMENT AND PLAN: An 81 year old Caucasian female brought into the ER who was just recently admitted to the hospital on June 2nd with a rectal bleed and had stent placement of superior mesenteric artery, is presenting to the ER again with recurrent lower gastrointestinal bleed with 4 to 5 bright red blood bowel movements.   ASSESSMENT AND PLAN:  1. Lower gastrointestinal bleed. Just recently had superior mesenteric artery stent placement during the previous admission We will admit her to telemetry, keep her n.p.o. and provide IV fluids. Typing and screening of the blood is done. Will transfuse if hemoglobin drops down.  2. We will monitor hemoglobin and hematocrit q. 6 hours. Check am labs. GI consult is placed to Dr. Mechele Collin and a vascular surgery consult is placed to Dr. Lorretta Harp.  3. We will provide her IV Protonix.  4. History of hypertension. Blood pressure is stable at this time. The patient is n.p.o. If necessary and if systolic blood pressure is greater than 160, will provide her IV antihypertensives.  5. History of osteoarthritis. Denies any pain at this time. If necessary, we will provide her pain management.   CODE STATUS: She is full code.   GI prophylaxis with Protonix and DVT prophylaxis with SCDs. The diagnosis and plan of care was discussed in detail with the patient. She verbalized understanding of the plan.    ____________________________ Ramonita Lab, MD ag:lt D: 07/22/2013 04:47:59 ET T: 07/22/2013 06:13:45 ET JOB#: [id number]  cc: Ramonita Lab, MD, <Dictator> Leo Grosser, MD Renford Dills, MD Ramonita Lab MD ELECTRONICALLY SIGNED 07/23/2013 1:14

## 2014-06-08 NOTE — H&P (Signed)
PATIENT NAME:  Kerri Ramirez, Kerri Ramirez MR#:  [id number] DATE OF BIRTH:  1926-01-17  DATE OF ADMISSION:  07/17/2013  PRIMARY CARE PHYSICIAN: Dr. Lorie PhenixNancy Maloney.   CHIEF COMPLAINT: Rectal bleeding.   HISTORY OF PRESENT ILLNESS: This is an 81 year old female, who presented to the Emergency Room due to multiple episodes of rectal bleeding that began yesterday afternoon. The patient says that she has had about 8 or 9 episodes since then. She says that she does have brown stool mixed with some bright red blood, but no clots. She has never had these symptoms before. She denies any abdominal pain, any nausea, vomiting, fevers, chills, chest pain, shortness of breath or any other associated symptoms. The patient presented to the Emergency Room and was noted to be mildly anemic and also noted to be in acute renal failure and tachycardic. Hospitalist services were contacted for further treatment and evaluation.   REVIEW OF SYSTEMS: CONSTITUTIONAL: No documented fever. No weight gain or weight loss. EYES: No blurry or double vision. ENT: No tinnitus. No postnasal drip. No redness of the oropharynx. RESPIRATORY: No cough. No wheeze. No hemoptysis. No dyspnea. CARDIOVASCULAR: No chest pain. No orthopnea. No palpitations. No syncope.  GASTROINTESTINAL: No nausea. No vomiting. Positive diarrhea. Positive hematochezia. No melena. GENITOURINARY: No dysuria or hematuria. ENDOCRINE: No polyuria or nocturia. No heat or cold intolerance. HEMATOLOGIC: No anemia. No bruising. No bleeding.  INTEGUMENTARY: No rashes. No lesions. MUSCULOSKELETAL: No arthritis. No swelling. No gout. NEUROLOGIC: No numbness or tingling. No ataxia. No seizure activity.  PSYCHIATRIC: No anxiety. No insomnia. No ADD.   PAST MEDICAL HISTORY: Consistent with hypertension and osteoarthritis.   ALLERGIES: No known drug allergies.   SOCIAL HISTORY: No smoking. No alcohol abuse. No illicit drug abuse. Lives at home with her husband.   FAMILY HISTORY:  Mother and father are both deceased. Her mother died when she was only 638 months old. Father died from complications of heart disease. Heart disease does seem to run in her family.   CURRENT MEDICATIONS: Centrum multivitamin daily, fish oil 1000 mg b.i.d., lisinopril 20 mg daily and meloxicam 15 mg daily.   PHYSICAL EXAMINATION: Presently is as follows:  VITAL SIGNS: Temperature is 98.6, pulse 86, respirations 20, blood pressure 189/76, sats 95% on room air.  GENERAL: She is a pleasant-appearing female in no apparent distress.  HEAD, EYES, EARS, NOSE, THROAT: Atraumatic, normocephalic. Extraocular muscles are intact. Pupils equal and reactive to light. Sclerae anicteric. No conjunctival injection. No pharyngeal erythema.  NECK: Supple. There is no jugular venous distention, no bruits, no lymphadenopathy, no thyromegaly.  HEART: Regular rate and rhythm. No murmurs, no rubs, no clicks.  LUNGS: Clear to auscultation bilaterally. No rales or rhonchi. No wheezes.  ABDOMEN: Soft, flat, nontender, nondistended. Has good bowel sounds. No hepatosplenomegaly appreciated.  EXTREMITIES: No evidence of any cyanosis, clubbing, or peripheral edema. Has +2 pedal and radial pulses bilaterally.  NEUROLOGIC: The patient is alert, awake and oriented x 3 with no focal motor or sensory deficits appreciated bilaterally.  SKIN: Moist and warm with no rashes appreciated.  LYMPHATIC: There is no cervical or axillary lymphadenopathy.   LABORATORY DATA: Serum glucose of 117, BUN 47, creatinine 1.6, sodium 141, potassium 5.2, chloride 117, bicarbonate 18. The patient's LFTs are within normal limits. Troponin less than 0.02. White cell count 11.9, hemoglobin 9.1, hematocrit 27.1, platelet count 293.   ASSESSMENT AND PLAN: This is an 81 year old female with a history of osteoarthritis, hypertension, who presented to the hospital due to multiple  episodes of diarrhea and rectal bleeding.  1.  Gastrointestinal bleed. This is  likely a lower gastrointestinal bleed given her rectal bleeding and diarrhea. She had no abdominal pain. No nausea, vomiting or fever. She has not been on any recent antibiotics. Her colonoscopy in May 2010 showed diverticulosis and internal hemorrhoids. Her hemoglobin is presently stable. For now, I will follow serial hemoglobins q.8 hours x 3. Will get a gastroenterology consult. Will consider getting a bleeding scan if she continues to bleed.  2.  Acute renal failure. This is likely secondary to the gastrointestinal bleed and diarrhea. I will hydrate her with intravenous fluids, follow BUN and creatinine and hold lisinopril.  3.  Hypertension. The patient's blood pressure is somewhat on the higher side, but I will hold her ACE inhibitor given her acute renal failure. I will place her on p.r.n. intravenous hydralazine for now.  4.  Osteoarthritis. Hold meloxicam given her gastrointestinal bleed.   CODE STATUS: The patient is a full code.   TIME SPENT: 50 minutes.   ____________________________ Rolly Pancake. Cherlynn Kaiser, MD vjs:aw D: 07/17/2013 11:34:33 ET T: 07/17/2013 11:49:33 ET JOB#: [id number]  cc: Rolly Pancake. Cherlynn Kaiser, MD, <Dictator> Houston Siren MD ELECTRONICALLY SIGNED 07/21/2013 21:57

## 2014-06-08 NOTE — Consult Note (Signed)
PATIENT NAME:  Kerri Ramirez, Kerri Ramirez MR#:  [id number] DATE OF BIRTH:  May 14, 1925  DATE OF CONSULTATION:  08/03/2013  REFERRING PHYSICIAN:  Dr. Imogene Burn. CONSULTING PHYSICIAN:  Stann Mainland. Sampson Goon, MD  CONSULTING SURGEON:  Dr. Michela Pitcher.  REASON FOR CONSULTATION:  Fever, bacteremia, C. diff colitis and urinary tract infection.   HISTORY OF PRESENT ILLNESS:  This is a very pleasant 81 year old female with history of mesenteric ischemia, status post stenting. He was admitted June 7th with GI bleeding. It was likely felt to be from a bleeding diverticula. On June 10th, she underwent surgical resection of her right hemicolon by Dr. Egbert Garibaldi. She was doing relatively well until June 16th when she developed a high fever to 102.8 in the setting of some nausea. She was found to have bacteremia with Klebsiella on 2 of 2 blood cultures and E. coli urinary tract infection. She also had a white count that jumped to 52,000. Her C. diff testing was positive. Since that time, she has clinically improved. She did have a CT of her abdomen, which did not show any abscess, but did show partial small bowel obstruction or postop ileus.   Today, she reports feeling somewhat better. She is having mild nausea but is actually eating a little. She is having no fevers, chills, sweats. Her diarrhea is much improved. She has no cough.   PAST MEDICAL HISTORY:  1.  Mesenteric ischemia, status post stenting.  2.  GI bleed as above.  3.  Hypertension.  4.  Arthritis.  5.  Recurrent GI bleed.   PAST SURGICAL HISTORY:  Superior mesenteric artery replacement.   ALLERGIES:  No known drug allergies.   SOCIAL HISTORY:  The patient lives with her husband. No history of smoking, alcohol or drugs.   FAMILY HISTORY:  Positive for heart disease.   REVIEW OF SYSTEMS:  Eleven systems reviewed and negative except as per HPI.   ANTIBIOTICS SINCE ADMISSION:  Currently, she is on levofloxacin and oral Flagyl. From June 16th to the 17th, she received  Unasyn. From June 15th to the 16th, meropenem. From June 17th to the 18, Zosyn.   PHYSICAL EXAMINATION: VITAL SIGNS:  Temperature 98.5. T-max over the last 48 hours is 98.6. On the 16th, she had a high temperature of 102.8. Prior to that, she had been relatively afebrile.  In general, she is pleasant, interactive, in no acute distress. Pulse 86, blood pressure 119/66, respirations 19, sat is 95% on room air.  HEENT:  Pupils equal, round and reactive to light and accommodation. Extraocular movements are intact. Sclerae anicteric. Oropharynx is clear.  NECK:  Supple.  HEART:  Regular.  LUNGS:  Coarse breath sounds bilaterally.  ABDOMEN:  Soft, mildly distended and mildly tender to palpation. She has well approximated  lower abdominal incision with staples in place. There is no evidence of erythema, drainage or marked tenderness.  EXTREMITIES:  No clubbing, cyanosis or edema.  NEUROLOGIC:  She is alert and oriented x 3. Grossly nonfocal neuro exam.   LABORATORY DATA:  Blood cultures June 16th grew Klebsiella pneumoniae in 2 of 2 bottles, sensitive to cefazolin, ceftriaxone, ciprofloxacin, gentamicin, ceftazidime, imipenem, levofloxacin and cefoxitin. Urine culture, June 16th, also grew E. coli, sensitive to Cipro, ceftriaxone, nitrofurantoin, gentamicin, imipenem, levofloxacin and cefoxitin. C. diff testing, June 17th, was positive. White blood count on June 16th was 9.0; however, the next day it was 59.2.   IMAGING: CT, June 16th, shows postoperative small bowel ileus. Small amount of ascites. There is a hiatal  hernia and chronic diverticulitis.  No evidence of abscess.   IMPRESSION:  A pleasant 81 year old female status post recent hemicolectomy who, on June 16th, had a high fever with bacteremia with Klebsiella. She also had an Escherichia coli urinary tract infection and have Clostridium diff. Clinically, she is markedly improving and white count is improving. CT shows no evidence of active abscess.  She is now postop day 9.   RECOMMENDATIONS: 1.  For the Klebsiella pneumoniae bacteremia, this is likely from abdominal source. I would recommend a 10 day total course of treatment. This can be levofloxacin and can be given orally when she is ready for discharge.  2. C. diff. I would recommend continuing the Flagyl orally for 14 days after stopping her levofloxacin. This will help assure complete treatment course.  3.  If she should worsen or develop any new fevers, would consider broadening out her antibiotic coverage, but right now there is no evidence of a bowel abscess.   Thank you for the consult. I will be glad to follow with you.   ____________________________ Stann Mainlandavid P. Sampson GoonFitzgerald, MD dpf:dmm D: 08/03/2013 18:30:16 ET T: 08/03/2013 20:15:01 ET JOB#: [id number]  cc: Stann Mainlandavid P. Sampson GoonFitzgerald, MD, <Dictator> Jenai Scaletta Sampson GoonFITZGERALD MD ELECTRONICALLY SIGNED 08/09/2013 13:28

## 2014-06-08 NOTE — Consult Note (Signed)
PATIENT NAME:  Kerri Ramirez, [name] MR#:  [id number] DATE OF BIRTH:  Apr 22, 1925  DATE OF CONSULTATION:  07/17/2013  CONSULTING PHYSICIAN:  Claude MangesWilliam F. Suresh Audi, MD  HISTORY OF PRESENT ILLNESS:  [name] is an 81 year old white female who presented to the Emergency Department today with rectal bleeding episodes that began approximately 6:00 p.m. the night before. She has bled 8 or 9 times prior to seeking medical attention and has had 5 bloody bowel movements as of 1600 today. She was found to have blush on bleeding scan in the ascending colon, a  hemoglobin of 9 on presentation, which went down to 7.5 by 1600 today, and a creatinine clearance calculated at less than 30 mL/minute and was taken to be special procedures radiology suite where Dr. Gilda CreaseSchnier performed a limited embolization of bleeding from the cecum, which was seen on the angiography. He also noted a very tight proximal long segment superior mesenteric artery stenosis and this was stented successfully. The celiac artery was opened and the inferior mesenteric artery was not interrogated. At the conclusion of the procedure, the patient vomited, but apparently did not aspirate. She is now lying comfortably in the bed in the CCU and is complaining of very mild epigastric abdominal pain and nausea.   PAST MEDICAL HISTORY: Hypertension, osteoarthritis, obesity.   MEDICATIONS: As listed in the chart which include fish oil.   ALLERGIES: None.   SOCIAL HISTORY: The patient lives at home with her husband. She likes to work on puzzles in the house and her husband likes to work outside in the yard. She has not smoked cigarettes for 30 or 40 years and smoked less than a pack a day for about 15 years prior to stopping. She does not drink alcohol and never has and does not use illicit drugs. [place]   REVIEW OF SYSTEMS: Negative for 10 systems except the gastrointestinal system as mentioned above in the history of  present illness.   FAMILY HISTORY: Noncontributory.   PHYSICAL EXAMINATION: GENERAL:  Height 5 feet 5 inches, weight 180 pounds, BMI 30.0. Temperature 97.8, pulse 86, respirations 24, blood pressure 141/59, oxygen saturation 100% on 2 liters nasal cannula at rest.  HEENT: Pupils equally round and reactive to light. Extraocular movements intact. Sclerae anicteric. Oropharynx clear. Mucous membranes moist. Hearing intact to voice.  NECK:  Supple with no thyroid enlargement, tracheal deviation or jugular venous distention.  HEART:  Regular rate and rhythm with no murmurs or rubs.  LUNGS:  Clear to auscultation with normal respiratory effort bilaterally.  ABDOMEN:  Obese, soft, flat and completely nontender.  EXTREMITIES:  No edema with normal capillary refill bilaterally.  NEUROLOGIC: Cranial nerves II through XII motor and sensation grossly intact.  PSYCHIATRIC: Alert and oriented x 4. Appropriate affect.   ASSESSMENT AND PLAN:  Bleeding from the cecum due to ischemic colitis from a high-grade proximal superior mesenteric artery stenosis, which has now been stented.   PLAN:  I discussed possible surgery with the patient and her family and they all agreed that if surgery is necessary that she would wish to undergo the necessary ascending colectomy. I explained that the chance of a colostomy is quite low, but not zero. I ordered a third unit of packed red blood cells (the patient has not received the 2 units of that have already been ordered yet), as well and intravenous Unasyn and another chest x-ray for the morning to rule out aspiration pneumonitis. I ordered coags both for  tonight and tomorrow morning, as well as IV Phenergan for her nausea and IV Pepcid. Hopefully, the bleeding will stop, but if not, an ascending colectomy may be necessary. I discussed the case with Dr. Gilda Crease as well.    ____________________________ Claude Manges, MD wfm:dmm D: 07/17/2013 20:29:12 ET T: 07/17/2013  21:01:29 ET JOB#: [id number]  cc: Claude Manges, MD, <Dictator> Leo Grosser, MD Claude Manges MD ELECTRONICALLY SIGNED 07/18/2013 20:26

## 2014-06-08 NOTE — Consult Note (Signed)
Brief Consult Note: Diagnosis: ischemic cecal bleeding.   Patient was seen by consultant.   Consult note dictated.   Recommend further assessment or treatment.   Orders entered.   Discussed with Attending MD.   Comments: Abdomen soft, flat, obese, nontender (so no perforation). I have ordered a 3rd unit of PRBCs, IV Unasyn, another CXR for the AM, coags (tonight and in AM), Phenergan, and Pepcid. Will follow. Hopefully bleeding will stop, but if not an ascending colectomy may be necessary. D/W patient, Dr Lorretta HarpSchneir, husband, daughter, and son-in-law.  Electronic Signatures: Claude MangesMarterre, Kaniya Trueheart F (MD)  (Signed 02-Jun-15 20:21)  Authored: Brief Consult Note   Last Updated: 02-Jun-15 20:21 by Claude MangesMarterre, Daleon Willinger F (MD)

## 2014-06-08 NOTE — Discharge Summary (Signed)
PATIENT NAME:  Kerri Ramirez, Kerri Ramirez MR#:  [id number] DATE OF BIRTH:  [birth year]  DATE OF ADMISSION:  07/22/2013 DATE OF DISCHARGE:  08/07/2013  FOR DETAILED CONSULTATIONS: Surgery Dr. Michela Pitcher and Infectious Disease Dr. Sampson Goon.  DISCHARGE DIAGNOSES:  1.  Acute gastrointestinal bleeding, recurrent right colon diverticular bleeding. 2.  Acute blood loss anemia.  3.  Partial small bowel obstruction or ileus. 4.  Sepsis with a Klebsiella pneumoniae bacteremia.  5.  Urinary tract infection.  6.  Clostridium difficile colitis.  7.  Acute renal failure.  8.  Hypertension.   PROCEDURE: Right colectomy.   CONDITION: Stable.   CODE STATUS: FULL CODE.   HOME MEDICATIONS: Please refer to the medication reconciliation list. According to Dr. Sampson Goon, the patient needs continual Levaquin for 4 days and continue Flagyl for 14 days after stopping Levaquin.   DIET: Regular diet.   ACTIVITY: As tolerated.   FOLLOW-UP CARE: Follow up with PCP within 1 to 2 weeks. Follow up with Dr. Anda Kraft, general surgeon, within 1 to 2 weeks.   THIS IS AN ADDENDUM: For detailed history and physical examination, please refer to the admission note dictated by Dr. Cheron Schaumann, and for intermittent discharge summary, please refer to the summary dictated by Dr. Enedina Finner.  1.  Gastrointestinal bleeding with diverticular bleeding. The patient underwent hemicolectomy by Dr. Egbert Garibaldi. Since the patient has acute blood loss anemia, the patient was given PRBC transfusion. The patient has no active bleeding after surgery, but hemoglobin is on the low side. The last hemoglobin is 7.2. Dr. Anda Kraft suggest give iron supplement and follow up hemoglobin as outpatient.   2.  Acute renal failure due to volume loss from gastrointestinal bleeding. The patient got a blood transfusion and IV fluid support. Renal function has improved.   3.  Sepsis with Klebsiella pneumoniae bacteremia. The patient's blood culture is positive for Klebsiella  pneumoniae sensitive to Levaquin. The patient has been treated with Levaquin. Dr. Sampson Goon suggests to continue Levaquin for a total of 10 days.  4.   Clostridium difficile colitis.  Patient developed  diarrhea.  A stool Clostridium difficile test showed Clostridium difficile  colitis. The patient has been treated with Flagyl. Diarrhea resovled. According to Dr. Sampson Goon, the patient will need to continue Flagyl for 14 days after stopping Levaquin.   5.  Urinary tract infection. The patient has been treated with Levaquin.   6.  Hypertension. The patient has a history of hypertension, but the patient's blood pressure is on the low side, about 98 to 100, so hypertension medication is discontinued.   7.  Partial small bowel obstruction or ileus. After surgery, the patient has continuously had nausea.  The patient continues to have nausea after surgery, but the patient tolerated diet. According to Dr.Ely , patient can continue diet. The patient's symptoms have much improved. She had no diarrhea, but no bowel movement for 3 days and finally she was treated with Colace and had a bowel movement yesterday.   8.  Weakness. According to physical therapy evaluation, the patient needs to be discharged to skilled nursing facility.   The patient is clinically stable and will be discharged to skilled nursing facility today. I discussed the patient's discharge plan with the patient, the patient's daughter, her husband, nurse, case Production designer, theatre/television/film, social worker and Dr. Anda Kraft   TIME SPENT: About 46 minutes.    ____________________________ Shaune Pollack, MD qc:ts D: 08/07/2013 13:30:23 ET T: 08/07/2013 14:01:57 ET JOB#: [id number]  cc: Shaune Pollack, MD, <Dictator> [facility]  MD ELECTRONICALLY SIGNED 08/07/2013 17:26

## 2014-06-08 NOTE — Op Note (Signed)
PATIENT NAME:  Kerri Ramirez, [name] MR#:  [id number] DATE OF BIRTH:  02-12-1926  DATE OF PROCEDURE:  09/01/2013  PREOPERATIVE DIAGNOSES:   1.  Extensive left lower extremity deep vein thrombosis.  2.  Recent gastrointestinal bleed and surgery.  3.  Hypertension.  4.  History of Clostridium difficile colitis.  5.  History of superior mesenteric artery stent for superior mesenteric artery stenosis.  POSTOPERATIVE DIAGNOSES:   1.  Extensive left lower extremity deep vein thrombosis.  2.  Recent gastrointestinal bleed and surgery.  3.  Hypertension.  4.  History of Clostridium difficile colitis.  5.  History of superior mesenteric artery stent for superior mesenteric artery stenosis.  PROCEDURES PERFORMED: 1.  Ultrasound guidance for vascular access to right femoral vein.  2.  Catheter placement into inferior vena cava.  3.  Inferior venacavogram.  4.  Placement of a Bard Denali IVC filter.   SURGEON: Annice NeedyJason S Dew, M.D.   ANESTHESIA: Local with 1 mg of Versed.   ESTIMATED BLOOD LOSS: Minimal.   FLUOROSCOPY TIME:  Less than 1 minute.   CONTRAST USED:  15 mL Visipaque.   INDICATION FOR PROCEDURE:   This is an 81 year old female who was admitted with extensive left lower extremity DVT.  She was hospitalized last month with a severe GI bleed requiring surgery and for this reason, is at high risk for full anticoagulation long term and we were asked to place an IVC filter.  Risks and benefits were discussed. Informed consent was obtained.   DESCRIPTION OF PROCEDURE:  The patient was brought to the vascular suite. The skin is sterilely prepped and draped and a sterile surgical field was created. The right femoral vein was accessed under direct ultrasound guidance without difficulty with a Seldinger needle and a J-wire was then placed. After skin nick and dilatation, the delivery sheath was placed into the inferior vena cava and an inferior venacavogram was performed. This demonstrated a patent  IVC with the level of the renal veins at top of L1. The filter was then deployed into the inferior vena cava at the level of the top of L2.  The delivery sheath was then removed. Pressure was held. Sterile dressing was placed. The patient tolerated the procedure well and was taken to the recovery room in stable condition.     ____________________________ Annice NeedyJason S. Dew, MD jsd:ds D: 09/01/2013 11:53:52 ET T: 09/01/2013 14:24:00 ET JOB#: [id number]  cc: Annice NeedyJason S. Dew, MD, <Dictator> Annice NeedyJASON S DEW MD ELECTRONICALLY SIGNED 09/20/2013 12:44

## 2014-06-08 NOTE — Consult Note (Signed)
Pt looks good.  VSS afebrile, abd not tender, no palpable mass.  Events of yesterday reviewed.  She denies any coughing or SOB today, good breath sounds in anterior fields, Hgb up to 10.5, WBC 16.5.  Exam shows carotid bruit on left, has triple lumen in right neck.  No new imput.  Electronic Signatures: Scot JunElliott, Deasha Clendenin T (MD)  (Signed on 03-Jun-15 14:42)  Authored  Last Updated: 03-Jun-15 14:42 by Scot JunElliott, Teyonna Plaisted T (MD)

## 2014-06-08 NOTE — H&P (Signed)
PATIENT NAME:  Kerri Ramirez, Kerri Ramirez MR#:  [id number] DATE OF BIRTH:  07/14/1925  DATE OF ADMISSION:  07/22/2013  ADDENDUM  LABORATORY AND IMAGING STUDIES:  Glucose 136, BUN 25, creatinine 1.32, sodium 141, potassium 3.7, chloride 116, CO2 18, GFR is 36.  Anion gap is normal.  Serum osmolality and calcium are normal.  LFTs:  Total protein 5.8, albumin 2.7, bilirubin total, alkaline phosphatase, AST, ALT are normal.  WBC 11.0, hemoglobin 9.1.  The patient's hemoglobin was at 9.2 on June 5th, hematocrit 27.9, platelets 245, MCV 90.  The patient's blood group is A-negative.  PT-INR abnormal.  A 12 lead EKG with normal sinus rhythm with a rate 84 beats per minute.  No acute ST-T wave changes.  Normal PR and QRS interval.      ____________________________ Ramonita LabAruna Azriel Dancy, MD ag:ea D: 07/22/2013 05:06:42 ET T: 07/22/2013 06:01:13 ET JOB#: [id number]  cc: Ramonita LabAruna Shalon Salado, MD, <Dictator> Ramonita LabARUNA Cyndia Degraff MD ELECTRONICALLY SIGNED 07/23/2013 1:13

## 2014-06-08 NOTE — Op Note (Signed)
PATIENT NAME:  Kerri Ramirez, Kerri Ramirez MR#:  [id number] DATE OF BIRTH:  Oct 13, 1925  DATE OF PROCEDURE:  12/31/2013  PREOPERATIVE DIAGNOSES: 1.  Deep vein thrombosis.  2.  Status post inferior vena cava filter placement   POSTOPERATIVE DIAGNOSES:  1.  Deep vein thrombosis.  2.  Status post inferior vena cava filter placement   PROCEDURES PERFORMED: 1.  Ultrasound guidance for vascular access of right jugular vein.  2.  Placement of catheter into the inferior vena cava.  3.  Inferior venacavogram.  4.  Removal of Bard Denali inferior vena cava filter.   SURGEON: Annice NeedyJason S. Tayjah Lobdell, M.D.   ANESTHESIA: Local with moderate conscious sedation.   ESTIMATED BLOOD LOSS: Minimal.   INDICATION FOR PROCEDURE: This is an 81 year old female who had an IVC filter placed for DVT several months ago. This is now chronic and no longer a concern for embolization, and she desires to have her IVC filter removed for the small but real risks of complications from these IVC filters. Risks and benefits were discussed and the patient desired to proceed.   DESCRIPTION OF PROCEDURE: The patient is brought to the vascular suite. The right neck was sterilely prepped and draped and a sterile surgical field was created. The jugular vein was visualized with ultrasound and found to be widely patent. It was then accessed under direct ultrasound guidance without difficulty with a Seldinger needle. A J-wire was then placed. After skin nick and dilatation, a 10-French dilator was placed over the wire and then we upsized to a 12-French sheath that was advanced into the inferior vena cava. Inferior venacavogram was performed. The filter was found to be in a reasonably straight orientation. The hook on the top of the filter was then snared without difficulty, with the triple snare, and the sheath was advanced over this to collapse the filter. The filter was then removed in its entirety without difficulty, and the retrieval sheath was then  removed, pressure was held on the neck, and sterile dressing was placed. The patient tolerated the procedure well and was taken to the recovery room in stable condition.  ____________________________ Annice NeedyJason S. Keymiah Lyles, MD jsd:sb D: 12/31/2013 09:54:17 ET T: 12/31/2013 10:13:28 ET JOB#: [id number]  cc: Annice NeedyJason S. Saraiyah Hemminger, MD, <Dictator> Annice NeedyJASON S Tangee Marszalek MD ELECTRONICALLY SIGNED 01/17/2014 9:16

## 2014-06-08 NOTE — Consult Note (Signed)
PATIENT NAME:  Kerri Ramirez, Kerri Ramirez MR#:  [id number] DATE OF BIRTH:  1925/07/21  DATE OF CONSULTATION:  07/22/2013  CONSULTING PHYSICIAN:  Cristal Deerhristopher A. Decorian Schuenemann, MD  REASON FOR CONSULTATION: Rectal bleeding, anemia, status post recent SMA stent and coil embolization of right colic artery.   HISTORY OF PRESENT ILLNESS: [name] is a pleasant 81 year old female who was recently admitted on the 2nd with GI bleed noted to have bleeding from her right colon thought to be ischemia. 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 She then underwent coil embolization of her right colon. She was discharged home on the 6th in satisfactory condition. She did have multiple bloody bowel movements, began feeling dizzy and lightheaded. Hemoglobin was down to 9, which is less than it was on hospital. Does have cramping before bowel movements. Has had about 4 or 5 bowel movements, most recently was just before my arrival into her room. Otherwise, no fevers, chills, night sweats, shortness of breath, cough, chest pain, current abdominal pain, nausea, vomiting, diarrhea, constipation, dysuria or hematuria.   PAST MEDICAL HISTORY: Hypertension, osteoarthritis, with history of lower GI bleed with SMA stent placement and coil embolization of right colon.   ALLERGIES: No known drug allergies.   SOCIAL HISTORY: No tobacco, alcohol or drug use. Lives at home with husband.   FAMILY HISTORY: Father died from heart disease.   REVIEW OF SYSTEMS: A 12-point review of systems was obtained. Pertinent positives and negatives as above.   HOME MEDICATIONS: Lisinopril 10 mg p.o. daily, fish oil 100,000 mg p.o. daily, multivitamins daily, aspirin 81 mg p.o. daily.   PHYSICAL EXAMINATION: VITAL SIGNS: Temperature 98.2, pulse 92, blood pressure 127/73, respirations 18, 100% on 2 liters.   GENERAL: No acute distress. Alert and oriented x 3.  HEAD: Normocephalic,  atraumatic.  EYES: No scleral icterus. No conjunctivitis.  Face: No obvious Trauma  normal external nose, normal external ear.   CHEST: Lungs clear to auscultation. Moving air well.  HEART: Regular rate and rhythm. No murmurs, rubs, or gallops.  ABDOMEN: Soft minimally tender, nondistended.  EXTREMITIES: Moves all extremities well. Strength 5/5.  NEUROLOGIC: Cranial nerves II through XII grossly intact. Sensation intact in all four extremities.   LABORATORY DATA: Significant at admission white cell count of 11.0, now 12.9, hemoglobin 9.1, now to 7.7,  down to 6.6, platelets are 245.   Imaging: No imaging completed yet.   ASSESSMENT AND PLAN: [name] is a pleasant, 81 year old female who presents for recurrent GI bleed. I have talked to Dr. Gilda CreaseSchnier. We will obtain a typed red cell scan to ensure  this is still her right colon that is bleeding. If it is bleeding we will likely need right hemicolectomy for bleeding. We will transfuse in the meantime.   ____________________________ Si Raiderhristopher A. Maylani Embree, MD cal:sg D: 07/22/2013 10:00:32 ET T: 07/22/2013 10:34:52 ET JOB#: [id number]  cc: Cristal Deerhristopher A. Habiba Treloar, MD, <Dictator> Jarvis NewcomerHRISTOPHER A Toniette Devera MD ELECTRONICALLY SIGNED 08/07/2013 11:04

## 2014-06-08 NOTE — Consult Note (Signed)
I walked past the patient's room and she called to me.  She is back in hospital soon after discharge for recurrent bleeding, likely lower bleeding.  Neg scan and now stopped.  Transfused with some rise in hgb.  Will follow with you.  Electronic Signatures: Scot JunElliott, Robert T (MD)  (Signed on 08-Jun-15 18:36)  Authored  Last Updated: 08-Jun-15 18:36 by Scot JunElliott, Robert T (MD)

## 2014-06-10 DIAGNOSIS — R413 Other amnesia: Secondary | ICD-10-CM | POA: Diagnosis not present

## 2014-06-10 DIAGNOSIS — E876 Hypokalemia: Secondary | ICD-10-CM | POA: Diagnosis not present

## 2014-06-10 DIAGNOSIS — R5383 Other fatigue: Secondary | ICD-10-CM | POA: Diagnosis not present

## 2014-07-12 DIAGNOSIS — R5383 Other fatigue: Secondary | ICD-10-CM | POA: Diagnosis not present

## 2014-07-12 DIAGNOSIS — M109 Gout, unspecified: Secondary | ICD-10-CM | POA: Diagnosis not present

## 2014-07-12 DIAGNOSIS — E876 Hypokalemia: Secondary | ICD-10-CM | POA: Diagnosis not present

## 2014-07-12 LAB — CBC AND DIFFERENTIAL
HEMATOCRIT: 30 % — AB (ref 36–46)
HEMOGLOBIN: 9.5 g/dL — AB (ref 12.0–16.0)
Platelets: 351 10*3/uL (ref 150–399)
WBC: 11.4 10^3/mL

## 2014-07-12 LAB — BASIC METABOLIC PANEL
BUN: 21 mg/dL (ref 4–21)
Creatinine: 1 mg/dL (ref 0.5–1.1)
GLUCOSE: 111 mg/dL
Potassium: 4 mmol/L (ref 3.4–5.3)
SODIUM: 141 mmol/L (ref 137–147)

## 2014-07-12 LAB — HEPATIC FUNCTION PANEL
ALT: 8 U/L (ref 7–35)
AST: 14 U/L (ref 13–35)

## 2014-08-05 ENCOUNTER — Encounter: Payer: Self-pay | Admitting: Family Medicine

## 2014-10-29 ENCOUNTER — Ambulatory Visit (INDEPENDENT_AMBULATORY_CARE_PROVIDER_SITE_OTHER): Payer: Medicare Other | Admitting: Family Medicine

## 2014-10-29 ENCOUNTER — Encounter: Payer: Self-pay | Admitting: Family Medicine

## 2014-10-29 VITALS — BP 136/76 | HR 76 | Temp 97.7°F | Resp 16 | Ht 64.0 in | Wt 159.0 lb

## 2014-10-29 DIAGNOSIS — Z23 Encounter for immunization: Secondary | ICD-10-CM | POA: Diagnosis not present

## 2014-10-29 DIAGNOSIS — M10071 Idiopathic gout, right ankle and foot: Secondary | ICD-10-CM | POA: Diagnosis not present

## 2014-10-29 DIAGNOSIS — R7303 Prediabetes: Secondary | ICD-10-CM | POA: Insufficient documentation

## 2014-10-29 DIAGNOSIS — M810 Age-related osteoporosis without current pathological fracture: Secondary | ICD-10-CM

## 2014-10-29 DIAGNOSIS — M545 Low back pain, unspecified: Secondary | ICD-10-CM | POA: Insufficient documentation

## 2014-10-29 DIAGNOSIS — R7309 Other abnormal glucose: Secondary | ICD-10-CM | POA: Diagnosis not present

## 2014-10-29 DIAGNOSIS — E669 Obesity, unspecified: Secondary | ICD-10-CM | POA: Insufficient documentation

## 2014-10-29 DIAGNOSIS — I82402 Acute embolism and thrombosis of unspecified deep veins of left lower extremity: Secondary | ICD-10-CM | POA: Insufficient documentation

## 2014-10-29 DIAGNOSIS — R413 Other amnesia: Secondary | ICD-10-CM | POA: Insufficient documentation

## 2014-10-29 DIAGNOSIS — M109 Gout, unspecified: Secondary | ICD-10-CM | POA: Insufficient documentation

## 2014-10-29 DIAGNOSIS — E876 Hypokalemia: Secondary | ICD-10-CM | POA: Insufficient documentation

## 2014-10-29 DIAGNOSIS — I82409 Acute embolism and thrombosis of unspecified deep veins of unspecified lower extremity: Secondary | ICD-10-CM

## 2014-10-29 DIAGNOSIS — D72829 Elevated white blood cell count, unspecified: Secondary | ICD-10-CM | POA: Diagnosis not present

## 2014-10-29 DIAGNOSIS — D509 Iron deficiency anemia, unspecified: Secondary | ICD-10-CM | POA: Insufficient documentation

## 2014-10-29 DIAGNOSIS — D510 Vitamin B12 deficiency anemia due to intrinsic factor deficiency: Secondary | ICD-10-CM

## 2014-10-29 DIAGNOSIS — E785 Hyperlipidemia, unspecified: Secondary | ICD-10-CM | POA: Insufficient documentation

## 2014-10-29 DIAGNOSIS — K449 Diaphragmatic hernia without obstruction or gangrene: Secondary | ICD-10-CM | POA: Insufficient documentation

## 2014-10-29 DIAGNOSIS — I10 Essential (primary) hypertension: Secondary | ICD-10-CM | POA: Insufficient documentation

## 2014-10-29 DIAGNOSIS — D649 Anemia, unspecified: Secondary | ICD-10-CM | POA: Insufficient documentation

## 2014-10-29 DIAGNOSIS — Z0000 Encounter for general adult medical examination without abnormal findings: Secondary | ICD-10-CM

## 2014-10-29 DIAGNOSIS — K219 Gastro-esophageal reflux disease without esophagitis: Secondary | ICD-10-CM | POA: Insufficient documentation

## 2014-10-29 DIAGNOSIS — L650 Telogen effluvium: Secondary | ICD-10-CM | POA: Insufficient documentation

## 2014-10-29 DIAGNOSIS — K559 Vascular disorder of intestine, unspecified: Secondary | ICD-10-CM | POA: Insufficient documentation

## 2014-10-29 DIAGNOSIS — R197 Diarrhea, unspecified: Secondary | ICD-10-CM | POA: Insufficient documentation

## 2014-10-29 NOTE — Progress Notes (Signed)
Patient ID: PRESLYN WARR, female   DOB: 11-09-1925, 81 y.o.   MRN: [id number]         Patient: Kerri Ramirez, Female    DOB: 1925/03/07, 81 y.o.   MRN: [id number] Visit Date: 10/29/2014  Today's Provider: Lorie Phenix, MD   Chief Complaint  Patient presents with  . Medicare Wellness   Subjective:    Annual wellness visit Kerri Ramirez is a 81 y.o. female. She feels fairly well.  Pt reports having a gout flare a few days ago, but she says it is improving.   She reports exercising as much as she can. She reports she is sleeping well.  Here by herself today.  Says that daughter and husband are doing well. Home situation stable.   -----------------------------------------------------------   Review of Systems  Constitutional: Negative.   HENT: Negative.   Respiratory: Negative.   Cardiovascular: Negative.   Gastrointestinal: Negative.   Endocrine: Negative.   Genitourinary: Negative.   Musculoskeletal: Positive for arthralgias (Recent Gout Flare; pt reports that it is improving. ). Negative for myalgias, back pain, joint swelling, gait problem, neck pain and neck stiffness.  Skin: Negative.   Allergic/Immunologic: Negative.   Neurological: Negative.   Hematological: Negative.   Psychiatric/Behavioral: Negative.     Social History   Social History  . Marital Status: Married    Spouse Name: N/A  . Number of Children: N/A  . Years of Education: N/A   Occupational History  . Not on file.   Social History Main Topics  . Smoking status: Former Smoker -- 2 years    Quit date: 02/16/1944  . Smokeless tobacco: Never Used  . Alcohol Use: No  . Drug Use: No  . Sexual Activity: Not on file   Other Topics Concern  . Not on file   Social History Narrative    Patient Active Problem List   Diagnosis Date Noted  . Absolute anemia 10/29/2014  . Acute thromboembolism of deep veins of lower extremity 10/29/2014  . Acid reflux 10/29/2014  . Borderline  diabetes 10/29/2014  . Gout 10/29/2014  . 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 HLD (hyperlipidemia) 10/29/2014  . BP (high blood pressure) 10/29/2014  . Decreased potassium in the blood 10/29/2014  . Iron deficiency 10/29/2014  . Colitis, ischemic 10/29/2014  . Elevated WBC count 10/29/2014  . LBP (low back pain) 10/29/2014  . Amnesia 10/29/2014  . Adiposity 10/29/2014  . OP (osteoporosis) 10/29/2014  . Addison anemia 10/29/2014  . D (diarrhea) 10/29/2014  . Telogen effluvium 10/29/2014  . Tubular adenoma of colon 11/22/1997    Past Surgical History  Procedure Laterality Date  . Ivc filter placement ([facility] hx)  09/01/2013  . Back surgery  1987    broken vertebra  . Ankle surgery Left 1981  . Hemicolectomy Right     Her family history includes Heart attack in her brother, brother, and father.    Previous Medications   COLCHICINE (COLCRYS) 0.6 MG TABLET    Take by mouth.   CYANOCOBALAMIN 1000 MCG TABLET    Take by mouth.   LISINOPRIL (PRINIVIL,ZESTRIL) 20 MG TABLET       OMEGA-3 FATTY ACIDS (FISH OIL) 1000 MG CAPS    Take by mouth.   PROBIOTIC CAPS    Take by mouth.    Patient Care Team: Lorie Phenix, MD as PCP - General (Family Medicine)     Objective:   Vitals: BP 136/76 mmHg  Pulse 76  Temp(Src) 97.7 F (36.5  C) (Oral)  Resp 16  Ht  (1.626 m)  Wt 159 lb (72.122 kg)  BMI 27.28 kg/m2  Physical Exam  Constitutional: She is oriented to person, place, and time. She appears well-developed and well-nourished.  HENT:  Head: Normocephalic and atraumatic.  Right Ear: Tympanic membrane, external ear and ear canal normal.  Left Ear: Tympanic membrane, external ear and ear canal normal.  Nose: Nose normal.  Mouth/Throat: Uvula is midline, oropharynx is clear and moist and mucous membranes are normal.  Eyes: Conjunctivae, EOM and lids are normal. Pupils are equal, round, and reactive to light.  Neck: Trachea normal and normal range of motion. Carotid bruit is not  present.  Cardiovascular: Normal rate and regular rhythm.   Murmur heard.  Systolic murmur is present  Pulmonary/Chest: Effort normal and breath sounds normal.  Abdominal: Soft. Normal appearance and normal aorta. There is no tenderness.  Musculoskeletal: Normal range of motion.  Lymphadenopathy:    She has no cervical adenopathy.    She has no axillary adenopathy.  Neurological: She is alert and oriented to person, place, and time.  Skin: Skin is warm, dry and intact.  Psychiatric: She has a normal mood and affect. Her speech is normal and behavior is normal. Judgment and thought content normal. She exhibits abnormal recent memory.    Activities of Daily Living In your present state of health, do you have any difficulty performing the following activities: 10/29/2014  Hearing? N  Vision? N  Difficulty concentrating or making decisions? Y  Walking or climbing stairs? N  Dressing or bathing? N  Doing errands, shopping? N    Fall Risk Assessment Fall Risk  10/29/2014  Falls in the past year? No     Depression Screen PHQ 2/9 Scores 10/29/2014  PHQ - 2 Score 0    Cognitive Testing - 6-CIT  Correct? Score   What year is it? no 4 0 or 4  What month is it? yes 0 0 or 3  Memorize:    Floyde Parkins,  44,  [address],  [place],      What time is it? (within 1 hour) yes 0 0 or 3  Count backwards from 20 yes 0 0, 2, or 4  Name the months of the year yes 0 0, 2, or 4  Repeat name & address above no 3 0, 2, 4, 6, 8, or 10       TOTAL SCORE  7/28   Interpretation:  Normal  Normal (0-7) Abnormal (8-28)       Assessment & Plan:     Annual Wellness Visit  Reviewed patient's Family Medical History Reviewed and updated list of patient's medical providers Assessment of cognitive impairment was done Assessed patient's functional ability Established a written schedule for health screening services Health Risk Assessent Completed and Reviewed  Exercise Activities and Dietary  recommendations Goals    None      Immunization History  Administered Date(s) Administered  . Pneumococcal Conjugate-13 12/03/2013    Health Maintenance  Topic Date Due  . TETANUS/TDAP  07/08/1946  . COLONOSCOPY  07/07/1977  . ZOSTAVAX  07/08/1987  . DEXA SCAN  07/07/1992  . PNA vac Low Risk Adult (1 of 2 - PCV13) 07/07/1992  . INFLUENZA VACCINE  09/16/2014      Discussed health benefits of physical activity, and encouraged her to engage in regular exercise appropriate for her age and condition.    1. Medicare annual wellness visit, subsequent As above.  Will check labs as below.    2. Acute gout of right foot, unspecified cause Improving.  Call if does not continue to improve.   3. Need for influenza vaccination - Flu vaccine HIGH DOSE PF (Fluzone High dose)  4. Elevated WBC count  5. Decreased potassium in the blood  6. OP (osteoporosis - DG Bone Density  7. Addison anemia - CBC with Differential/Platelet  8. Borderline diabetes  - Comprehensive metabolic panel - Hemoglobin A ------------------------------------------------------------------------------------------------------------  Patient was seen and examined by Leo Grosser, MD, and note scribed by Kavin Leech, CMA.  I have reviewed the document for accuracy and completeness and I agree with above. Leo Grosser, MD   Lorie Phenix, MD

## 2014-10-30 ENCOUNTER — Telehealth: Payer: Self-pay

## 2014-10-30 LAB — COMPREHENSIVE METABOLIC PANEL WITH GFR
ALT: 8 [IU]/L (ref 0–32)
AST: 19 [IU]/L (ref 0–40)
Albumin/Globulin Ratio: 1.6 (ref 1.1–2.5)
Albumin: 4 g/dL (ref 3.5–4.7)
Alkaline Phosphatase: 80 [IU]/L (ref 39–117)
BUN/Creatinine Ratio: 24 (ref 11–26)
BUN: 24 mg/dL (ref 8–27)
Bilirubin Total: 0.3 mg/dL (ref 0.0–1.2)
CO2: 15 mmol/L — ABNORMAL LOW (ref 18–29)
Calcium: 9.8 mg/dL (ref 8.7–10.3)
Chloride: 113 mmol/L — ABNORMAL HIGH (ref 97–108)
Creatinine, Ser: 0.98 mg/dL (ref 0.57–1.00)
GFR calc Af Amer: 60 mL/min/{1.73_m2}
GFR calc non Af Amer: 52 mL/min/{1.73_m2} — ABNORMAL LOW
Globulin, Total: 2.5 g/dL (ref 1.5–4.5)
Glucose: 90 mg/dL (ref 65–99)
Potassium: 4.2 mmol/L (ref 3.5–5.2)
Sodium: 144 mmol/L (ref 134–144)
Total Protein: 6.5 g/dL (ref 6.0–8.5)

## 2014-10-30 LAB — CBC WITH DIFFERENTIAL/PLATELET
Basophils Absolute: 0 10*3/uL (ref 0.0–0.2)
Basos: 0 %
EOS (ABSOLUTE): 0.2 10*3/uL (ref 0.0–0.4)
Eos: 3 %
Hematocrit: 30.8 % — ABNORMAL LOW (ref 34.0–46.6)
Hemoglobin: 10 g/dL — ABNORMAL LOW (ref 11.1–15.9)
Immature Grans (Abs): 0 10*3/uL (ref 0.0–0.1)
Immature Granulocytes: 0 %
Lymphocytes Absolute: 2.5 10*3/uL (ref 0.7–3.1)
Lymphs: 29 %
MCH: 26.2 pg — ABNORMAL LOW (ref 26.6–33.0)
MCHC: 32.5 g/dL (ref 31.5–35.7)
MCV: 81 fL (ref 79–97)
Monocytes Absolute: 0.4 10*3/uL (ref 0.1–0.9)
Monocytes: 5 %
Neutrophils Absolute: 5.3 10*3/uL (ref 1.4–7.0)
Neutrophils: 63 %
Platelets: 333 10*3/uL (ref 150–379)
RBC: 3.82 x10E6/uL (ref 3.77–5.28)
RDW: 16.1 % — ABNORMAL HIGH (ref 12.3–15.4)
WBC: 8.5 10*3/uL (ref 3.4–10.8)

## 2014-10-30 LAB — HEMOGLOBIN A1C
Est. average glucose Bld gHb Est-mCnc: 114 mg/dL
Hgb A1c MFr Bld: 5.6 % (ref 4.8–5.6)

## 2014-10-30 NOTE — Telephone Encounter (Signed)
-----   Message from Lorie Phenix, MD sent at 10/30/2014  9:46 AM EDT ----- Labs stable. Hgb low but stable. Restart iron if any fatigue.  Please notify her daughter. Thanks.

## 2014-10-30 NOTE — Telephone Encounter (Signed)
LMTCB 10/30/2014  Thanks,   -Andris Brothers  

## 2014-10-30 NOTE — Telephone Encounter (Signed)
Pt's Daughter advised as directed below.   Thanks,   -Vernona Rieger

## 2014-11-11 ENCOUNTER — Ambulatory Visit
Admission: RE | Admit: 2014-11-11 | Discharge: 2014-11-11 | Disposition: A | Discharge status: Home / Self Care | Payer: Medicare Other | Source: Ambulatory Visit | Attending: Family Medicine | Admitting: Family Medicine

## 2014-11-11 DIAGNOSIS — M810 Age-related osteoporosis without current pathological fracture: Secondary | ICD-10-CM | POA: Insufficient documentation

## 2014-11-14 ENCOUNTER — Telehealth: Payer: Self-pay | Admitting: Family Medicine

## 2014-11-14 DIAGNOSIS — I10 Essential (primary) hypertension: Secondary | ICD-10-CM | POA: Diagnosis not present

## 2014-11-14 NOTE — Telephone Encounter (Signed)
Does have osteoporosis. PLease clarify if patient has been on medication in the past. Really would be good to have ov and review possible treatment options Thanks.

## 2014-11-14 NOTE — Telephone Encounter (Signed)
Pt's daughter [name] called to get results of pt's bone density test that was done on 11/11/14. Thanks TNP

## 2014-11-15 NOTE — Telephone Encounter (Signed)
Pt returned call.  I scheduled her an appt for Monday.  Thanks. Barth Kirks

## 2014-11-15 NOTE — Telephone Encounter (Signed)
Per Dr. Elease Hashimoto, she would refer to address this with the patient and daughter in the office.  ED

## 2014-11-15 NOTE — Telephone Encounter (Signed)
LMTCB ED 

## 2014-11-18 ENCOUNTER — Encounter: Payer: Self-pay | Admitting: Family Medicine

## 2014-11-18 ENCOUNTER — Ambulatory Visit (INDEPENDENT_AMBULATORY_CARE_PROVIDER_SITE_OTHER): Payer: Medicare Other | Admitting: Family Medicine

## 2014-11-18 VITALS — BP 140/66 | HR 76 | Temp 97.7°F | Resp 16

## 2014-11-18 DIAGNOSIS — M1000 Idiopathic gout, unspecified site: Secondary | ICD-10-CM

## 2014-11-18 DIAGNOSIS — M810 Age-related osteoporosis without current pathological fracture: Secondary | ICD-10-CM

## 2014-11-18 MED ORDER — ALENDRONATE SODIUM 70 MG PO TABS: 70.0000 mg | ORAL_TABLET | ORAL | Status: DC

## 2014-11-18 NOTE — Patient Instructions (Signed)
Low-Purine Diet °Purines are compounds that affect the level of uric acid in your body. A low-purine diet is a diet that is low in purines. Eating a low-purine diet can prevent the level of uric acid in your body from getting too high and causing gout or kidney stones or both. °WHAT DO I NEED TO KNOW ABOUT THIS DIET? °· Choose low-purine foods. Examples of low-purine foods are listed in the next section. °· Drink plenty of fluids, especially water. Fluids can help remove uric acid from your body. Try to drink 8-16 cups (1.9-3.8 L) a day. °· Limit foods high in fat, especially saturated fat, as fat makes it harder for the body to get rid of uric acid. Foods high in saturated fat include pizza, cheese, ice cream, whole milk, fried foods, and gravies. Choose foods that are lower in fat and lean sources of protein. Use olive oil when cooking as it contains healthy fats that are not high in saturated fat. °· Limit alcohol. Alcohol interferes with the elimination of uric acid from your body. If you are having a gout attack, avoid all alcohol. °· Keep in mind that different people's bodies react differently to different foods. You will probably learn over time which foods do or do not affect you. If you discover that a food tends to cause your gout to flare up, avoid eating that food. You can more freely enjoy foods that do not cause problems. If you have any questions about a food item, talk to your dietitian or health care provider. °WHICH FOODS ARE LOW, MODERATE, AND HIGH IN PURINES? °The following is a list of foods that are low, moderate, and high in purines. You can eat any amount of the foods that are low in purines. You may be able to have small amounts of foods that are moderate in purines. Ask your health care provider how much of a food moderate in purines you can have. Avoid foods high in purines. °Grains °· Foods low in purines: Enriched white bread, pasta, rice, cake, cornbread, popcorn. °· Foods moderate in  purines: Whole-grain breads and cereals, wheat germ, bran, oatmeal. Uncooked oatmeal. Dry wheat bran or wheat germ. °· Foods high in purines: Pancakes, French toast, biscuits, muffins. °Vegetables °· Foods low in purines: All vegetables, except those that are moderate in purines. °· Foods moderate in purines: Asparagus, cauliflower, spinach, mushrooms, green peas. °Fruits °· All fruits are low in purines. °Meats and other Protein Foods °· Foods low in purines: Eggs, nuts, peanut butter. °· Foods moderate in purines: 80-90% lean beef, lamb, veal, pork, poultry, fish, eggs, peanut butter, nuts. Crab, lobster, oysters, and shrimp. Cooked dried beans, peas, and lentils. °· Foods high in purines: Anchovies, sardines, herring, mussels, tuna, codfish, scallops, trout, and haddock. Bacon. Organ meats (such as liver or kidney). Tripe. Game meat. Goose. Sweetbreads. °Dairy °· All dairy foods are low in purines. Low-fat and fat-free dairy products are best because they are low in saturated fat. °Beverages °· Drinks low in purines: Water, carbonated beverages, tea, coffee, cocoa. °· Drinks moderate in purines: Soft drinks and other drinks sweetened with high-fructose corn syrup. Juices. To find whether a food or drink is sweetened with high-fructose corn syrup, look at the ingredients list. °· Drinks high in purines: Alcoholic beverages (such as beer). °Condiments °· Foods low in purines: Salt, herbs, olives, pickles, relishes, vinegar. °· Foods moderate in purines: Butter, margarine, oils, mayonnaise. °Fats and Oils °· Foods low in purines: All types, except gravies   and sauces made with meat. °· Foods high in purines: Gravies and sauces made with meat. °Other Foods °· Foods low in purines: Sugars, sweets, gelatin. Cake. Soups made without meat. °· Foods moderate in purines: Meat-based or fish-based soups, broths, or bouillons. Foods and drinks sweetened with high-fructose corn syrup. °· Foods high in purines: High-fat desserts  (such as ice cream, cookies, cakes, pies, doughnuts, and chocolate). °Contact your dietitian for more information on foods that are not listed here. °Document Released: 05/29/2010 Document Revised: 02/06/2013 Document Reviewed: 01/08/2013 °ExitCare® Patient Information ©2015 [employer]. This information is not intended to replace advice given to you by your health care provider. Make sure you discuss any questions you have with your health care provider. ° °Gout °Gout is an inflammatory arthritis caused by a buildup of uric acid crystals in the joints. Uric acid is a chemical that is normally present in the blood. When the level of uric acid in the blood is too high it can form crystals that deposit in your joints and tissues. This causes joint redness, soreness, and swelling (inflammation). Repeat attacks are common. Over time, uric acid crystals can form into masses (tophi) near a joint, destroying bone and causing disfigurement. Gout is treatable and often preventable. °CAUSES  °The disease begins with elevated levels of uric acid in the blood. Uric acid is produced by your body when it breaks down a naturally found substance called purines. Certain foods you eat, such as meats and fish, contain high amounts of purines. Causes of an elevated uric acid level include: °· Being passed down from parent to child (heredity). °· Diseases that cause increased uric acid production (such as obesity, psoriasis, and certain cancers). °· Excessive alcohol use. °· Diet, especially diets rich in meat and seafood. °· Medicines, including certain cancer-fighting medicines (chemotherapy), water pills (diuretics), and aspirin. °· Chronic kidney disease. The kidneys are no longer able to remove uric acid well. °· Problems with metabolism. °Conditions strongly associated with gout include: °· Obesity. °· High blood pressure. °· High cholesterol. °· Diabetes. °Not everyone with elevated uric acid levels gets gout. It is not  understood why some people get gout and others do not. Surgery, joint injury, and eating too much of certain foods are some of the factors that can lead to gout attacks. °SYMPTOMS  °· An attack of gout comes on quickly. It causes intense pain with redness, swelling, and warmth in a joint. °· Fever can occur. °· Often, only one joint is involved. Certain joints are more commonly involved: °¨ Base of the big toe. °¨ Knee. °¨ Ankle. °¨ Wrist. °¨ Finger. °Without treatment, an attack usually goes away in a few days to weeks. Between attacks, you usually will not have symptoms, which is different from many other forms of arthritis. °DIAGNOSIS  °Your caregiver will suspect gout based on your symptoms and exam. In some cases, tests may be recommended. The tests may include: °· Blood tests. °· Urine tests. °· X-rays. °· Joint fluid exam. This exam requires a needle to remove fluid from the joint (arthrocentesis). Using a microscope, gout is confirmed when uric acid crystals are seen in the joint fluid. °TREATMENT  °There are two phases to gout treatment: treating the sudden onset (acute) attack and preventing attacks (prophylaxis). °· Treatment of an Acute Attack. °¨ Medicines are used. These include anti-inflammatory medicines or steroid medicines. °¨ An injection of steroid medicine into the affected joint is sometimes necessary. °¨ The painful joint is rested. Movement   can worsen the arthritis. °¨ You may use warm or cold treatments on painful joints, depending which works best for you. °· Treatment to Prevent Attacks. °¨ If you suffer from frequent gout attacks, your caregiver may advise preventive medicine. These medicines are started after the acute attack subsides. These medicines either help your kidneys eliminate uric acid from your body or decrease your uric acid production. You may need to stay on these medicines for a very long time. °¨ The early phase of treatment with preventive medicine can be associated with  an increase in acute gout attacks. For this reason, during the first few months of treatment, your caregiver may also advise you to take medicines usually used for acute gout treatment. Be sure you understand your caregiver's directions. Your caregiver may make several adjustments to your medicine dose before these medicines are effective. °¨ Discuss dietary treatment with your caregiver or dietitian. Alcohol and drinks high in sugar and fructose and foods such as meat, poultry, and seafood can increase uric acid levels. Your caregiver or dietitian can advise you on drinks and foods that should be limited. °HOME CARE INSTRUCTIONS  °· Do not take aspirin to relieve pain. This raises uric acid levels. °· Only take over-the-counter or prescription medicines for pain, discomfort, or fever as directed by your caregiver. °· Rest the joint as much as possible. When in bed, keep sheets and blankets off painful areas. °· Keep the affected joint raised (elevated). °· Apply warm or cold treatments to painful joints. Use of warm or cold treatments depends on which works best for you. °· Use crutches if the painful joint is in your leg. °· Drink enough fluids to keep your urine clear or pale yellow. This helps your body get rid of uric acid. Limit alcohol, sugary drinks, and fructose drinks. °· Follow your dietary instructions. Pay careful attention to the amount of protein you eat. Your daily diet should emphasize fruits, vegetables, whole grains, and fat-free or low-fat milk products. Discuss the use of coffee, vitamin C, and cherries with your caregiver or dietitian. These may be helpful in lowering uric acid levels. °· Maintain a healthy body weight. °SEEK MEDICAL CARE IF:  °· You develop diarrhea, vomiting, or any side effects from medicines. °· You do not feel better in 24 hours, or you are getting worse. °SEEK IMMEDIATE MEDICAL CARE IF:  °· Your joint becomes suddenly more tender, and you have chills or a fever. °MAKE  SURE YOU:  °· Understand these instructions. °· Will watch your condition. °· Will get help right away if you are not doing well or get worse. °Document Released: 01/30/2000 Document Revised: 06/18/2013 Document Reviewed: 09/15/2011 °ExitCare® Patient Information ©2015 [employer]. This information is not intended to replace advice given to you by your health care provider. Make sure you discuss any questions you have with your health care provider. ° °

## 2014-11-18 NOTE — Progress Notes (Signed)
Patient ID: Kerri Ramirez, female   DOB: 1925-12-04, 81 y.o.   MRN: [id number]         Patient: Kerri Ramirez Female    DOB: 02/07/1926   81 y.o.   MRN: [id number] Visit Date: 11/18/2014  Today's Provider: Lorie Phenix, MD   Chief Complaint  Patient presents with  . Osteoporosis   Subjective:    HPI   Follow up for bone density Patient most recent bone density consistent with osteoporosis. Has not had any recent fractures.   Feels well. Here with her daughter today. Was on Fosamax many years ago with no history of problems with it. Does not remember how to take it at this point.  Daughter can help her with this. Kidney function ok, no teeth. She has not been on chronic steroids.    Also concerned about her gout. Would like to consider allopurinol, but changed her mind when she realized she would have to take it daily. Thought she could just take it when she eats meat or other foods. Does not have a good diet at this time.   ------------------------------------------------------------------------------------       Allergies  Allergen Reactions  . Codeine Other (See Comments)    Made head spin    Previous Medications   CYANOCOBALAMIN 1000 MCG TABLET    Take by mouth.   OMEGA-3 FATTY ACIDS (FISH OIL) 1000 MG CAPS    Take by mouth.   PROBIOTIC CAPS    Take by mouth.    Review of Systems  Social History  Substance Use Topics  . Smoking status: Former Smoker -- 2 years    Quit date: 02/16/1944  . Smokeless tobacco: Never Used  . Alcohol Use: No   Objective:   BP 140/66 mmHg  Pulse 76  Temp(Src) 97.7 F (36.5 C)  Resp 16  Physical Exam  Constitutional: She is oriented to person, place, and time. She appears well-developed and well-nourished.  Neurological: She is alert and oriented to person, place, and time.  Psychiatric: She has a normal mood and affect. Her behavior is normal. Thought content normal.       Assessment & Plan:     1. OP  (osteoporosis) Recurrent problem Reviewed bone density and FRAX with patient and daughter. Had been on Fosamax in 2007 and tolerated without difficulty. Will restart. Stressed importance of how to take medication. Does not have any teeth. Will start medication and recheck in 2 years.  - alendronate (FOSAMAX) 70 MG tablet; Take 1 tablet (70 mg total) by mouth every 7 (seven) days. Take with a full glass of water on an empty stomach.  Dispense: 4 tablet; Refill: 11  2. Idiopathic gout, unspecified chronicity, unspecified site Discussed diet. Will hold off on allopurinol for now. Printed out low purine diet for patient.  Will call if wants to start the medication.       Lorie Phenix, MD  [facility]

## 2014-11-21 ENCOUNTER — Ambulatory Visit: Payer: Medicare Other | Admitting: Family Medicine

## 2014-11-26 ENCOUNTER — Other Ambulatory Visit: Payer: Self-pay

## 2014-11-26 DIAGNOSIS — D649 Anemia, unspecified: Secondary | ICD-10-CM

## 2014-11-26 MED ORDER — POLYSACCHARIDE IRON COMPLEX 150 MG PO CAPS: 150.0000 mg | ORAL_CAPSULE | Freq: Two times a day (BID) | ORAL | Status: DC

## 2014-11-26 NOTE — Telephone Encounter (Signed)
Pharmacy requesting refill. Last OV was 11/18/14.

## 2014-12-18 ENCOUNTER — Telehealth: Payer: Self-pay | Admitting: Family Medicine

## 2014-12-18 NOTE — Telephone Encounter (Signed)
Patient's daughter Eunice BlaseDebbie returned call.

## 2014-12-18 NOTE — Telephone Encounter (Signed)
Ok to stop iron and see if resolves. Thanks.

## 2014-12-18 NOTE — Telephone Encounter (Signed)
Eunice BlaseDebbie reports that Mrs. Kerri Ramirez has been having diarrhea on and off since starting iron supplement. Patient is only taking 1 tablet of iron to see if that would decrease diarrhea. Patient has not seen blood or mucus in stool. No fever or weight loss. Please advise. sd

## 2014-12-18 NOTE — Telephone Encounter (Signed)
Pt's daughter Eunice BlaseDebbie request a nurse to call her back. Eunice BlaseDebbie would just say she needs to ask the nurse a question. Thanks TNP

## 2014-12-18 NOTE — Telephone Encounter (Signed)
LMTCB. sd  

## 2014-12-19 NOTE — Telephone Encounter (Signed)
Debbie advised as directed below.    Thanks,   -Laura  

## 2015-02-20 DIAGNOSIS — H6123 Impacted cerumen, bilateral: Secondary | ICD-10-CM | POA: Diagnosis not present

## 2015-02-20 DIAGNOSIS — H903 Sensorineural hearing loss, bilateral: Secondary | ICD-10-CM | POA: Diagnosis not present

## 2015-04-23 ENCOUNTER — Encounter: Payer: Self-pay | Admitting: *Deleted

## 2015-04-23 ENCOUNTER — Telehealth: Payer: Self-pay

## 2015-04-23 NOTE — Telephone Encounter (Signed)
Daughter, Stanton KidneyDebra, called and states that she will not be able to make appointment with her mother on Monday. She states before you have called daughter to get an update of any concerns since patient wont always tell the truth. She wanted to see if you could call her to discuss some things about the patient. Her CB [phone]. Thanks-aa

## 2015-04-25 NOTE — Telephone Encounter (Signed)
Patient's daughter called again wanting to talk with you before her mother's appt on Monday. She reports that she had a few things to discuss, but is unable to discuss them while her mother is in the room. Please call her at the number listed below. Thanks!

## 2015-04-25 NOTE — Telephone Encounter (Signed)
Please let her know still with patient's and try later or Monday.  Thanks.

## 2015-04-25 NOTE — Telephone Encounter (Signed)
Paranoid at times. Does not remember things. Thinks someone is tampering with the money.  For past one and a half years, she buys stuff and can not remember. Was a hoarder before the stroke.  Blames her husband.  Also thinks Fosamax is causing diarrhea.   Has not seen stomach doctor recently.  She gets onto her husband and then he gets mad at her.  Can' reason with her. She does not remember things.  Daughter is her POA.  Will address it on Monday.

## 2015-04-25 NOTE — Telephone Encounter (Signed)
Debra aware that you are with your last patient and will wait for your call tonight. sd

## 2015-04-28 ENCOUNTER — Encounter: Payer: Self-pay | Admitting: Family Medicine

## 2015-04-28 ENCOUNTER — Ambulatory Visit (INDEPENDENT_AMBULATORY_CARE_PROVIDER_SITE_OTHER): Payer: Medicare Other | Admitting: Family Medicine

## 2015-04-28 ENCOUNTER — Encounter: Payer: Self-pay | Admitting: *Deleted

## 2015-04-28 ENCOUNTER — Telehealth: Payer: Self-pay | Admitting: Family Medicine

## 2015-04-28 VITALS — BP 142/62 | HR 76 | Temp 98.3°F | Resp 16 | Wt 160.0 lb

## 2015-04-28 DIAGNOSIS — R413 Other amnesia: Secondary | ICD-10-CM | POA: Diagnosis not present

## 2015-04-28 DIAGNOSIS — R197 Diarrhea, unspecified: Secondary | ICD-10-CM

## 2015-04-28 DIAGNOSIS — D649 Anemia, unspecified: Secondary | ICD-10-CM | POA: Diagnosis not present

## 2015-04-28 DIAGNOSIS — K469 Unspecified abdominal hernia without obstruction or gangrene: Secondary | ICD-10-CM | POA: Diagnosis not present

## 2015-04-28 DIAGNOSIS — F0390 Unspecified dementia without behavioral disturbance: Secondary | ICD-10-CM | POA: Insufficient documentation

## 2015-04-28 NOTE — Telephone Encounter (Signed)
Daughter called saying she usually comes to the appts with her mom.  She said she didn't come this last visit and now her mom cant remember anything regarding her appt.  She wants to know id you can call her ?  She states she is on her moms DPR.  Her call back is  [phone]  Thanks Barth Kirkseri

## 2015-04-28 NOTE — Telephone Encounter (Signed)
Please review. Thanks!  

## 2015-04-28 NOTE — Telephone Encounter (Signed)
Talked with daughter.  Has some abdominal pain. Did not want to try tylenol. Did get labs done. Thanks.

## 2015-04-28 NOTE — Progress Notes (Signed)
Patient ID: Kerri Ramirez, female   DOB: 04-Jul-1925, 82 y.o.   MRN: [id number]       Patient: Kerri Ramirez Female    DOB: Oct 25, 1925   82 y.o.   MRN: [id number] Visit Date: 04/28/2015  Today's Provider: Lorie Phenix, MD   Chief Complaint  Patient presents with  . Anemia    6 month F/U    Subjective:    Anemia Presents for follow-up visit. Symptoms include malaise/fatigue. There has been no palpitations, paresthesias or weight loss. Past treatments include oral iron supplements. Compliance problems include medication side effects (Patient reports that iron supplements cause her to have diarrhea).    Also with some short term memory loss and confusion and some paranoid tendencies according to the family, but patient does not think she has a problem. Tried multiple ways to determine if there were any issues at home, but was unsuccessful.  Does "fuss" at her husband but is ok because they have been together 70 years.   Does however think that Fosamax may also be causing diarrhea.    Also has lump in her abdomen that is uncomfortable at times.  Not sure why. Did have bowel surgery. Also with history of C diff.     Allergies  Allergen Reactions  . Codeine Other (See Comments)    Made head spin    Previous Medications   ALENDRONATE (FOSAMAX) 70 MG TABLET    Take 1 tablet (70 mg total) by mouth every 7 (seven) days. Take with a full glass of water on an empty stomach.   CYANOCOBALAMIN 1000 MCG TABLET    Take by mouth.   IRON POLYSACCHARIDES (FERREX 150) 150 MG CAPSULE    Take 1 capsule (150 mg total) by mouth 2 (two) times daily.   OMEGA-3 FATTY ACIDS (FISH OIL) 1000 MG CAPS    Take by mouth.   PROBIOTIC CAPS    Take by mouth.    Review of Systems  Constitutional: Positive for malaise/fatigue and fatigue. Negative for weight loss.  Cardiovascular: Negative for palpitations.  Gastrointestinal: Positive for diarrhea.  Neurological: Negative for paresthesias.    Social  History  Substance Use Topics  . Smoking status: Former Smoker -- 2 years    Quit date: 02/16/1944  . Smokeless tobacco: Never Used  . Alcohol Use: No   Objective:   BP 142/62 mmHg  Pulse 76  Temp(Src) 98.3 F (36.8 C)  Resp 16  Wt 160 lb (72.576 kg)  Physical Exam  Constitutional: She is oriented to person, place, and time. She appears well-developed and well-nourished.  Cardiovascular: Normal rate and regular rhythm.   Pulmonary/Chest: Effort normal and breath sounds normal.  Abdominal: A hernia is present.  Neurological: She is alert and oriented to person, place, and time.  Psychiatric: She has a normal mood and affect. Her behavior is normal. Judgment and thought content normal.  Nursing note and vitals reviewed.     Assessment & Plan:     1. Anemia, unspecified anemia type Check labs. May need to restart iron, or have GI referral.   - CBC with Differential/Platelet - Ferritin - Iron - Iron Binding Cap (TIBC)  2. Short-term memory loss Recheck labs. Patient declines further work up at this time.   - TSH - RPR  3. Abdominal hernia without obstruction and without gangrene, recurrence not specified, unspecified hernia type New problem. Referral to Surgery to evaluate and treat. May just need to be watched for now.   -  Ambulatory referral to General Surgery  4. Diarrhea, unspecified type Recheck C diff toxin.  - Stool C-Diff Toxin Assay  Patient was seen and examined by Leo GrosserNancy J. Stevi Hollinshead, MD, and scribed by Anson Oregonachelle Presley, CMA.  I have reviewed the document for accuracy and completeness and I agree with above. - Leo GrosserNancy J. Kery Batzel, MD      Lorie PhenixNancy Sharod Petsch, MD  [facility]

## 2015-04-29 ENCOUNTER — Telehealth: Payer: Self-pay

## 2015-04-29 LAB — CBC WITH DIFFERENTIAL/PLATELET
BASOS ABS: 0 10*3/uL (ref 0.0–0.2)
Basos: 0 %
EOS (ABSOLUTE): 0.1 10*3/uL (ref 0.0–0.4)
Eos: 1 %
HEMOGLOBIN: 10.8 g/dL — AB (ref 11.1–15.9)
Hematocrit: 32.5 % — ABNORMAL LOW (ref 34.0–46.6)
IMMATURE GRANS (ABS): 0 10*3/uL (ref 0.0–0.1)
Immature Granulocytes: 0 %
LYMPHS: 23 %
Lymphocytes Absolute: 2 10*3/uL (ref 0.7–3.1)
MCH: 28.5 pg (ref 26.6–33.0)
MCHC: 33.2 g/dL (ref 31.5–35.7)
MCV: 86 fL (ref 79–97)
MONOCYTES: 6 %
Monocytes Absolute: 0.5 10*3/uL (ref 0.1–0.9)
NEUTROS ABS: 6.1 10*3/uL (ref 1.4–7.0)
NEUTROS PCT: 70 %
PLATELETS: 344 10*3/uL (ref 150–379)
RBC: 3.79 x10E6/uL (ref 3.77–5.28)
RDW: 15.2 % (ref 12.3–15.4)
WBC: 8.8 10*3/uL (ref 3.4–10.8)

## 2015-04-29 LAB — IRON AND TIBC
IRON SATURATION: 12 % — AB (ref 15–55)
IRON: 43 ug/dL (ref 27–139)
Total Iron Binding Capacity: 367 ug/dL (ref 250–450)
UIBC: 324 ug/dL (ref 118–369)

## 2015-04-29 LAB — FERRITIN: Ferritin: 10 ng/mL — ABNORMAL LOW (ref 15–150)

## 2015-04-29 LAB — TSH: TSH: 2.62 u[IU]/mL (ref 0.450–4.500)

## 2015-04-29 LAB — RPR: RPR Ser Ql: NONREACTIVE

## 2015-04-29 NOTE — Telephone Encounter (Signed)
Patient's daughter advised as below.  °

## 2015-04-29 NOTE — Telephone Encounter (Signed)
-----   Message from Lorie PhenixNancy Maloney, MD sent at 04/29/2015  8:04 AM EDT ----- Labs fairly stable. Hemoglobin low but stable Iron Low.  Please notify daughter and see if she can get her to take iron once a day.   Thanks.

## 2015-04-30 LAB — CLOSTRIDIUM DIFFICILE EIA: C difficile Toxins A+B, EIA: NEGATIVE

## 2015-05-01 ENCOUNTER — Telehealth: Payer: Self-pay

## 2015-05-01 NOTE — Telephone Encounter (Signed)
Eunice BlaseDebbie (Pt's daughter) Algis Downsdvised as directed below.   Thanks,   -Vernona RiegerLaura

## 2015-05-01 NOTE — Telephone Encounter (Signed)
-----   Message from Lorie PhenixNancy Maloney, MD sent at 04/30/2015 11:25 AM EDT ----- C diff negative. Please notify patient's daughter.  Thanks.

## 2015-05-01 NOTE — Telephone Encounter (Signed)
LMTCB 05/01/2015  Thanks,   -Christen Wardrop  

## 2015-05-08 ENCOUNTER — Ambulatory Visit (INDEPENDENT_AMBULATORY_CARE_PROVIDER_SITE_OTHER): Payer: Medicare Other | Admitting: General Surgery

## 2015-05-08 ENCOUNTER — Encounter: Payer: Self-pay | Admitting: General Surgery

## 2015-05-08 VITALS — BP 170/70 | HR 88 | Resp 18 | Ht 64.0 in | Wt 161.0 lb

## 2015-05-08 DIAGNOSIS — I82402 Acute embolism and thrombosis of unspecified deep veins of left lower extremity: Secondary | ICD-10-CM | POA: Diagnosis not present

## 2015-05-08 DIAGNOSIS — K439 Ventral hernia without obstruction or gangrene: Secondary | ICD-10-CM | POA: Diagnosis not present

## 2015-05-08 NOTE — Progress Notes (Signed)
Patient ID: Kerri EeMargaret V Anton, female   DOB: 1925-06-01, 82 y.o.   MRN: [id number]  Chief Complaint  Patient presents with  . Hernia    HPI Kerri Ramirez is a 82 y.o. female.  Here today for evaluation of a possible hernia per Dr. Elease HashimotoMaloney. She states her stomach swells and has a bulge that she noticed several months ago. No pain associated with the hernia, but generalized discomfort during episodes of diarrhea. She notices discomfortore when she has episodes of diarrhea. The diarrhea occurs about every 2-3 weeks lasting 1 day.  The patient had a long complicated medical course in 2015. She presented with lower GI bleeding. Bleeding scan showing evidence of a source in the right colon. 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 After dilatation this had been cannulated and beaded embolization undertaken. A stent was placed during the procedure into the SMA. The patient had recurrent bleeding and subsequently underwent an open right hemicolectomy completed by Natale LayMark Bird, M.D. on 07/25/2013. Fascial closure at that time was with #1 Vicryl. The patient developed fever and bacteremia and C. difficile colitis was identified. She was evaluated by the infectious disease service. Oral lateral was utilized for the colitis. She recently underwent testing and was no longer a carrier. In July 2015 she presented with an extensive low caloric 70 DVT subsequently was anticoagulated and had a vena cava filter placed.  Pathology of the resected colon showed diverticulitis.  I personally reviewed the patient's history   HPI  Past Medical History  Diagnosis Date  . Hypertension     Past Surgical History  Procedure Laterality Date  . Ivc filter placement ([facility] hx)  09/01/2013  . Back surgery  1987    broken vertebra  . Ankle surgery Left 1981  . Hemicolectomy Right 07-25-13    Dr Byrd/diverticulitis  . Colonoscopy      Dr Mechele CollinElliott    Family History   Problem Relation Age of Onset  . Heart attack Father   . Heart attack Brother   . Heart attack Brother     Social History Social History  Substance Use Topics  . Smoking status: Former Smoker -- 2 years    Quit date: 02/16/1944  . Smokeless tobacco: Never Used  . Alcohol Use: No    Allergies  Allergen Reactions  . Codeine Other (See Comments)    Made head spin     Current Outpatient Prescriptions  Medication Sig Dispense Refill  . alendronate (FOSAMAX) 70 MG tablet Take 70 mg by mouth once a week.     . cyanocobalamin 1000 MCG tablet Take by mouth.    . Omega-3 Fatty Acids (FISH OIL) 1000 MG CAPS Take by mouth.    . Polysaccharide Iron Complex (FERREX 150 PO) Take by mouth daily.    . Probiotic CAPS Take by mouth.     No current facility-administered medications for this visit.    Review of Systems Review of Systems  Constitutional: Negative.   Respiratory: Negative.   Cardiovascular: Negative.   Gastrointestinal: Positive for diarrhea. Negative for nausea, constipation and blood in stool.    Blood pressure 170/70, pulse 88, resp. rate 18, height 5\' 4"  (1.626 m), weight 161 lb (73.029 kg).  Physical Exam Physical Exam  Constitutional: She is oriented to person, place, and time. She appears well-developed and well-nourished.  Eyes: Conjunctivae are normal. No scleral icterus.  Neck: Neck supple.  Cardiovascular: Normal rate and regular rhythm.  Murmur heard.  Systolic murmur is present with a grade of 3/6  Pulses:      Dorsalis pedis pulses are 2+ on the right side, and 2+ on the left side.       Posterior tibial pulses are 2+ on the right side, and 2+ on the left side.  Pulmonary/Chest: Effort normal. She has wheezes in the left lower field.  Abdominal: Soft. Normal appearance and bowel sounds are normal. There is no tenderness. A hernia ( 4 cm ) is present.  Lymphadenopathy:    She has no cervical adenopathy.  Neurological: She is alert and oriented to  person, place, and time.  Skin: Skin is warm and dry.    Data Review  04/28/2015 laboratory studies were reviewed. Normochromic anemia with low iron stores, low ferritin, low TIBC.  No evidence of C. difficile. (Obtain because of reports of recurrent episodes of diarrhea and past history)  PCP notes documented poor tolerance of iron by mouth.  Assessment    Ventral hernia status post urgent right hemicolectomy for bleeding.    Plan    Pros and cons of surgical intervention were reviewed. The hernia itself is easily reducible. The patient is very reluctant to consider surgery at this time. I don't suspect that this has any direct correlation with her episodic diarrhea, but this is certainly possible. There is no history of nausea or vomiting to suggest intermittent obstruction. The patient was accompanied by her daughter, Toribio Harbour, who was present for the interview and exam. At this time, the patient declined surgical intervention. Indication for urgent pair were reviewed. Less than 50% of the visit was spent reviewing the extensive medical records from 2015-2017 and reviewing surgical options.     Follow up if she decides to have hernia surgery or symptoms worsen.  PCP:  Lorie Phenix This information has been scribed by Dorathy Daft RNBC.   Earline Mayotte 05/09/2015, 1:37 PM

## 2015-05-08 NOTE — Patient Instructions (Signed)
Follow up if she decides to have hernia surgery or symptoms worsen

## 2015-05-09 DIAGNOSIS — K439 Ventral hernia without obstruction or gangrene: Secondary | ICD-10-CM | POA: Insufficient documentation

## 2015-08-28 ENCOUNTER — Ambulatory Visit (INDEPENDENT_AMBULATORY_CARE_PROVIDER_SITE_OTHER): Payer: Medicare Other | Admitting: Family Medicine

## 2015-08-28 ENCOUNTER — Encounter: Payer: Self-pay | Admitting: Family Medicine

## 2015-08-28 VITALS — BP 146/60 | HR 78 | Temp 97.9°F | Resp 16 | Wt 164.0 lb

## 2015-08-28 DIAGNOSIS — E785 Hyperlipidemia, unspecified: Secondary | ICD-10-CM | POA: Diagnosis not present

## 2015-08-28 DIAGNOSIS — M810 Age-related osteoporosis without current pathological fracture: Secondary | ICD-10-CM | POA: Diagnosis not present

## 2015-08-28 DIAGNOSIS — E611 Iron deficiency: Secondary | ICD-10-CM

## 2015-08-28 DIAGNOSIS — R7303 Prediabetes: Secondary | ICD-10-CM

## 2015-08-28 NOTE — Progress Notes (Signed)
Patient: Kerri Ramirez Female    DOB: [birth year]   82 y.o.   MRN: [id number] Visit Date: 08/28/2015  Today's Provider: Dortha Kernennis Chrismon, PA   Chief Complaint  Patient presents with  . Anemia   Subjective:    HPI Patient is here today for a follow up. She reports that on last OV, it was noted that her iron levels were low. Patient reports that she has been taking OTC iron supplements twice a day. She reports that she has been tolerating medication well with no side effects.  Patient was also referred to general surgery about her abdominal hernia. She was evaluated by Dr. Lemar LivingsByrnett, and per patient she declined surgery at that time. She reports that she does have occasional pain/tenderness in her abdomen, but it is tolerable.    Past Medical History  Diagnosis Date  . Hypertension    Patient Active Problem List   Diagnosis Date Noted  . Ventral hernia without obstruction or gangrene 05/09/2015  . Short-term memory loss 04/28/2015  . Absolute anemia 10/29/2014  . Acute thromboembolism of deep veins of lower extremity (HCC) 10/29/2014  . Acid reflux 10/29/2014  . Borderline diabetes 10/29/2014  . Gout 10/29/2014  . 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 HLD (hyperlipidemia) 10/29/2014  . BP (high blood pressure) 10/29/2014  . Decreased potassium in the blood 10/29/2014  . Iron deficiency 10/29/2014  . Colitis, ischemic (HCC) 10/29/2014  . Elevated WBC count 10/29/2014  . LBP (low back pain) 10/29/2014  . Amnesia 10/29/2014  . Adiposity 10/29/2014  . OP (osteoporosis) 10/29/2014  . Addison anemia 10/29/2014  . D (diarrhea) 10/29/2014  . Telogen effluvium 10/29/2014  . Tubular adenoma of colon 11/22/1997   Past Surgical History  Procedure Laterality Date  . Ivc filter placement ([facility] hx)  09/01/2013  . Back surgery  1987    broken vertebra  . Ankle surgery Left 1981  . Hemicolectomy Right 07-25-13    Dr Byrd/diverticulitis  . Colonoscopy      Dr Mechele CollinElliott    Allergies  Allergen Reactions  . Codeine Other (See Comments)    Made head spin    Current Meds  Medication Sig  . alendronate (FOSAMAX) 70 MG tablet Take 70 mg by mouth once a week.   . cyanocobalamin 1000 MCG tablet Take by mouth.  . ferrous sulfate 325 (65 FE) MG tablet Take 325 mg by mouth 2 (two) times daily with a meal.  . Omega-3 Fatty Acids (FISH OIL) 1000 MG CAPS Take by mouth.  . Polysaccharide Iron Complex (FERREX 150 PO) Take by mouth daily.  . Probiotic CAPS Take by mouth.    Review of Systems  Constitutional: Negative.   Respiratory: Negative.   Cardiovascular: Negative.   Gastrointestinal: Positive for abdominal pain. Negative for nausea, vomiting, diarrhea, constipation, blood in stool and rectal pain.  Musculoskeletal: Positive for back pain and arthralgias.    Social History  Substance Use Topics  . Smoking status: Former Smoker -- 2 years    Quit date: 02/16/1944  . Smokeless tobacco: Never Used  . Alcohol Use: No   Objective:   BP 146/60 mmHg  Pulse 78  Temp(Src) 97.9 F (36.6 C)  Resp 16  Wt 164 lb (74.39 kg)  Physical Exam  Constitutional: She is oriented to person, place, and time. She appears well-developed and well-nourished.  HENT:  Head: Normocephalic.  Right Ear: External ear normal.  Left Ear: External ear normal.  Nose: Nose normal.  Eyes: Conjunctivae are normal.  Neck: Neck supple.  Cardiovascular: Normal rate and regular rhythm.   Murmur heard. Grade 2/6 systolic murmur.  Pulmonary/Chest: Breath sounds normal.  Abdominal: Bowel sounds are normal.  Neurological: She is oriented to person, place, and time.  Skin:  Multiple bruises on forearms.  Psychiatric: She has a normal mood and affect.      Assessment & Plan:     1. Iron deficiency Still on Ferrex 150 mg BID. Feeling better energy level. Will check labs and follow up in 3 months. - Iron - CBC with Differential/Platelet  2. Borderline diabetes Denies any  polyuria, polydipsia or polyphagia. No hypoglycemic episodes. Will check CMP. - Comprehensive metabolic panel  3. OP (osteoporosis) Still taking Fosamax 70 mg once a week.  - Comprehensive metabolic panel  4. HLD (hyperlipidemia) Trying to limit fats in diet and takes Fish Oil daily. No Statins now. Recheck lipids and recheck pending reports. - Lipid panel - Comprehensive metabolic panel       Dortha Kern, PA  [facility]

## 2015-08-29 LAB — CBC WITH DIFFERENTIAL/PLATELET
BASOS ABS: 0 10*3/uL (ref 0.0–0.2)
Basos: 0 %
EOS (ABSOLUTE): 0.1 10*3/uL (ref 0.0–0.4)
Eos: 1 %
HEMOGLOBIN: 11.2 g/dL (ref 11.1–15.9)
Hematocrit: 32.6 % — ABNORMAL LOW (ref 34.0–46.6)
IMMATURE GRANULOCYTES: 0 %
Immature Grans (Abs): 0 10*3/uL (ref 0.0–0.1)
LYMPHS ABS: 2.2 10*3/uL (ref 0.7–3.1)
LYMPHS: 25 %
MCH: 30.2 pg (ref 26.6–33.0)
MCHC: 34.4 g/dL (ref 31.5–35.7)
MCV: 88 fL (ref 79–97)
MONOCYTES: 4 %
Monocytes Absolute: 0.4 10*3/uL (ref 0.1–0.9)
NEUTROS PCT: 70 %
Neutrophils Absolute: 6.2 10*3/uL (ref 1.4–7.0)
Platelets: 321 10*3/uL (ref 150–379)
RBC: 3.71 x10E6/uL — AB (ref 3.77–5.28)
RDW: 14.6 % (ref 12.3–15.4)
WBC: 8.9 10*3/uL (ref 3.4–10.8)

## 2015-08-29 LAB — COMPREHENSIVE METABOLIC PANEL
A/G RATIO: 1.8 (ref 1.2–2.2)
ALT: 9 IU/L (ref 0–32)
AST: 17 IU/L (ref 0–40)
Albumin: 4.3 g/dL (ref 3.5–4.7)
Alkaline Phosphatase: 68 IU/L (ref 39–117)
BUN/Creatinine Ratio: 31 — ABNORMAL HIGH (ref 12–28)
BUN: 29 mg/dL — ABNORMAL HIGH (ref 8–27)
Bilirubin Total: 0.4 mg/dL (ref 0.0–1.2)
CALCIUM: 10 mg/dL (ref 8.7–10.3)
CO2: 18 mmol/L (ref 18–29)
Chloride: 107 mmol/L — ABNORMAL HIGH (ref 96–106)
Creatinine, Ser: 0.93 mg/dL (ref 0.57–1.00)
GFR, EST AFRICAN AMERICAN: 63 mL/min/{1.73_m2} (ref >59)
GFR, EST NON AFRICAN AMERICAN: 55 mL/min/{1.73_m2} — AB (ref >59)
GLOBULIN, TOTAL: 2.4 g/dL (ref 1.5–4.5)
Glucose: 96 mg/dL (ref 65–99)
Potassium: 4.6 mmol/L (ref 3.5–5.2)
Sodium: 141 mmol/L (ref 134–144)
TOTAL PROTEIN: 6.7 g/dL (ref 6.0–8.5)

## 2015-08-29 LAB — LIPID PANEL
CHOLESTEROL TOTAL: 207 mg/dL — AB (ref 100–199)
Chol/HDL Ratio: 3.3 ratio units (ref 0.0–4.4)
HDL: 62 mg/dL (ref >39)
LDL CALC: 122 mg/dL — AB (ref 0–99)
TRIGLYCERIDES: 113 mg/dL (ref 0–149)
VLDL Cholesterol Cal: 23 mg/dL (ref 5–40)

## 2015-08-29 LAB — IRON: IRON: 78 ug/dL (ref 27–139)

## 2015-09-01 ENCOUNTER — Telehealth: Payer: Self-pay

## 2015-09-01 NOTE — Telephone Encounter (Signed)
-----   Message from Tamsen Roersennis E Chrismon, [place] sent at 08/29/2015  6:05 PM EDT ----- BUN slightly above normal. Usually indicates need for more water intake.Total cholesterol and LDL elevated. Very good HDL. Continue present low fat diet and Omega-3 Fish Oil regimen. Iron level improved and  Hgb back to normal. Continue one Iron tablet daily and recheck levels in 3 months.

## 2015-09-01 NOTE — Telephone Encounter (Signed)
Pt's daughter Gavin PoundDeborah advised.  She reports that Claris CheMargaret has been taking two iron pills a day; should she continue with two or drop down to one.  Please advise.  Thanks,   -Vernona RiegerLaura

## 2015-09-01 NOTE — Telephone Encounter (Signed)
Back down to one tablet daily and recheck in 3 months to be sure this maintains good blood levels.

## 2015-09-02 NOTE — Telephone Encounter (Signed)
Advised patient as below.  

## 2015-09-12 ENCOUNTER — Other Ambulatory Visit: Payer: Self-pay | Admitting: Family Medicine

## 2015-11-28 ENCOUNTER — Encounter: Payer: Self-pay | Admitting: Family Medicine

## 2015-11-28 ENCOUNTER — Ambulatory Visit (INDEPENDENT_AMBULATORY_CARE_PROVIDER_SITE_OTHER): Payer: Medicare Other | Admitting: Family Medicine

## 2015-11-28 VITALS — BP 148/68 | HR 82 | Temp 98.4°F | Resp 16 | Wt 170.8 lb

## 2015-11-28 DIAGNOSIS — E611 Iron deficiency: Secondary | ICD-10-CM | POA: Diagnosis not present

## 2015-11-28 DIAGNOSIS — Z8719 Personal history of other diseases of the digestive system: Secondary | ICD-10-CM

## 2015-11-28 DIAGNOSIS — E785 Hyperlipidemia, unspecified: Secondary | ICD-10-CM | POA: Diagnosis not present

## 2015-11-28 DIAGNOSIS — Z23 Encounter for immunization: Secondary | ICD-10-CM

## 2015-11-28 NOTE — Progress Notes (Signed)
Patient: Kerri Ramirez Female    DOB: 1925/04/16   82 y.o.   MRN: [id number] Visit Date: 11/28/2015  Today's Provider: Dortha Kernennis Devansh Riese, PA   Chief Complaint  Patient presents with  . Hyperlipidemia  . Osteoporosis  . Follow-up   Subjective:    HPI   Lipid/Cholesterol, Follow-up:   Last seen for this 3 months ago.  Management since that visit includes continue low fat diet and fish oil regimen.  Last Lipid Panel:    Component Value Date/Time   CHOL 207 (H) 08/28/2015 0955   TRIG 113 08/28/2015 0955   HDL 62 08/28/2015 0955   CHOLHDL 3.3 08/28/2015 0955   LDLCALC 122 (H) 08/28/2015 0955    She reports excellent compliance with treatment. She is not having side effects.   Wt Readings from Last 3 Encounters:  08/28/15 164 lb (74.4 kg)  05/08/15 161 lb (73 kg)  04/28/15 160 lb (72.6 kg)    ------------------------------------------------------------------------ Patient is here today for a 3 month follow up. Patient was advised to continue Ferrex 150 mg BID for Iron Deficiency and continue Fosamax for Osteoporosis. Patient reports excellent compliance with treatment plans. Patient denies any side effects.   Past Medical History:  Diagnosis Date  . Hypertension    Patient Active Problem List   Diagnosis Date Noted  . Ventral hernia without obstruction or gangrene 05/09/2015  . Short-term memory loss 04/28/2015  . Absolute anemia 10/29/2014  . Acute thromboembolism of deep veins of lower extremity (HCC) 10/29/2014  . Acid reflux 10/29/2014  . Borderline diabetes 10/29/2014  . Gout 10/29/2014  . 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 HLD (hyperlipidemia) 10/29/2014  . BP (high blood pressure) 10/29/2014  . Decreased potassium in the blood 10/29/2014  . Iron deficiency 10/29/2014  . Colitis, ischemic (HCC) 10/29/2014  . Elevated WBC count 10/29/2014  . LBP (low back pain) 10/29/2014  . Amnesia 10/29/2014  . Adiposity 10/29/2014  . OP (osteoporosis) 10/29/2014   . Addison anemia 10/29/2014  . D (diarrhea) 10/29/2014  . Telogen effluvium 10/29/2014  . Tubular adenoma of colon 11/22/1997   Past Surgical History:  Procedure Laterality Date  . ANKLE SURGERY Left 1981  . BACK SURGERY  1987   broken vertebra  . COLONOSCOPY     Dr Mechele CollinElliott  . HEMICOLECTOMY Right 07-25-13   Dr Byrd/diverticulitis  . IVC FILTER PLACEMENT ([facility] HX)  09/01/2013   Family History  Problem Relation Age of Onset  . Heart attack Father   . Heart attack Brother   . Heart attack Brother    Allergies  Allergen Reactions  . Codeine Other (See Comments)    Made head spin      Previous Medications   ALENDRONATE (FOSAMAX) 70 MG TABLET    TAKE 1 TABLET BY MOUTH ONCE A WEEK ON EMPTY STOMACH WITH A FULL GLASSOF WATER   CYANOCOBALAMIN 1000 MCG TABLET    Take by mouth.   FERROUS SULFATE 325 (65 FE) MG TABLET    Take 325 mg by mouth 2 (two) times daily with a meal.   OMEGA-3 FATTY ACIDS (FISH OIL) 1000 MG CAPS    Take by mouth.   POLYSACCHARIDE IRON COMPLEX (FERREX 150 PO)    Take by mouth daily.   PROBIOTIC CAPS    Take by mouth.    Review of Systems  Constitutional: Negative.   Respiratory: Negative.   Cardiovascular: Negative.   Musculoskeletal: Negative.     Social History  Substance Use Topics  .  Smoking status: Former Smoker    Years: 2.00    Quit date: 02/16/1944  . Smokeless tobacco: Never Used  . Alcohol use No   Objective:   BP (!) 148/68 (BP Location: Right Arm, Patient Position: Sitting, Cuff Size: Normal)   Pulse 82   Temp 98.4 F (36.9 C) (Oral)   Resp 16   Wt 170 lb 12.8 oz (77.5 kg)   BMI 29.32 kg/m  Wt Readings from Last 3 Encounters:  11/28/15 170 lb 12.8 oz (77.5 kg)  08/28/15 164 lb (74.4 kg)  05/08/15 161 lb (73 kg)   Physical Exam  Constitutional: She is oriented to person, place, and time. She appears well-developed and well-nourished. No distress.  HENT:  Head: Normocephalic and atraumatic.  Right Ear: Hearing normal.  Left  Ear: Hearing normal.  Nose: Nose normal.  Eyes: Conjunctivae and lids are normal. Right eye exhibits no discharge. Left eye exhibits no discharge. No scleral icterus.  Neck: Neck supple.  Cardiovascular: Normal rate and regular rhythm.   Pulmonary/Chest: Effort normal. No respiratory distress.  Abdominal: Soft. Bowel sounds are normal.  Neurological: She is alert and oriented to person, place, and time.  Skin: Skin is intact. No lesion and no rash noted.  Psychiatric: She has a normal mood and affect. Her speech is normal and behavior is normal. Thought content normal.      Assessment & Plan:     1. Hyperlipidemia, unspecified hyperlipidemia type Tolerating Omega-3 Fish Oil and low fat diet. Recheck CMP and lipid panel.  - Comprehensive metabolic panel - Lipid panel  2. Iron deficiency Presently on B12 1000 mcg qd and Iron supplement BID. Will recheck CBC and ferritin level. Energy level stable without dizziness. Follow up pending reports. - CBC with Differential/Platelet - Ferritin  3. History of ischemic colitis History of right hemicolectomy 07-25-13. Chronic intermittent diarrhea since surgery. Encouraged to use Pepto-Bismol and Probiotic supplements prn. Recheck labs. - CBC with Differential/Platelet  4. Need for influenza vaccination - Flu vaccine HIGH DOSE PF

## 2015-11-29 LAB — COMPREHENSIVE METABOLIC PANEL
A/G RATIO: 1.8 (ref 1.2–2.2)
ALK PHOS: 62 IU/L (ref 39–117)
ALT: 10 IU/L (ref 0–32)
AST: 21 IU/L (ref 0–40)
Albumin: 4.3 g/dL (ref 3.5–4.7)
BILIRUBIN TOTAL: 0.5 mg/dL (ref 0.0–1.2)
BUN/Creatinine Ratio: 25 (ref 12–28)
BUN: 22 mg/dL (ref 8–27)
CHLORIDE: 108 mmol/L — AB (ref 96–106)
CO2: 18 mmol/L (ref 18–29)
Calcium: 10 mg/dL (ref 8.7–10.3)
Creatinine, Ser: 0.87 mg/dL (ref 0.57–1.00)
GFR calc Af Amer: 69 mL/min/{1.73_m2} (ref >59)
GFR calc non Af Amer: 60 mL/min/{1.73_m2} (ref >59)
Globulin, Total: 2.4 g/dL (ref 1.5–4.5)
Glucose: 101 mg/dL — ABNORMAL HIGH (ref 65–99)
POTASSIUM: 4.4 mmol/L (ref 3.5–5.2)
Sodium: 142 mmol/L (ref 134–144)
Total Protein: 6.7 g/dL (ref 6.0–8.5)

## 2015-11-29 LAB — CBC WITH DIFFERENTIAL/PLATELET
BASOS ABS: 0.1 10*3/uL (ref 0.0–0.2)
BASOS: 1 %
EOS (ABSOLUTE): 0.1 10*3/uL (ref 0.0–0.4)
Eos: 1 %
Hematocrit: 33.2 % — ABNORMAL LOW (ref 34.0–46.6)
Hemoglobin: 11.4 g/dL (ref 11.1–15.9)
IMMATURE GRANS (ABS): 0 10*3/uL (ref 0.0–0.1)
Immature Granulocytes: 0 %
LYMPHS: 27 %
Lymphocytes Absolute: 2.5 10*3/uL (ref 0.7–3.1)
MCH: 29.7 pg (ref 26.6–33.0)
MCHC: 34.3 g/dL (ref 31.5–35.7)
MCV: 87 fL (ref 79–97)
MONOS ABS: 0.5 10*3/uL (ref 0.1–0.9)
Monocytes: 5 %
NEUTROS ABS: 6.3 10*3/uL (ref 1.4–7.0)
NEUTROS PCT: 66 %
PLATELETS: 325 10*3/uL (ref 150–379)
RBC: 3.84 x10E6/uL (ref 3.77–5.28)
RDW: 13.5 % (ref 12.3–15.4)
WBC: 9.4 10*3/uL (ref 3.4–10.8)

## 2015-11-29 LAB — LIPID PANEL
CHOL/HDL RATIO: 3.7 ratio (ref 0.0–4.4)
Cholesterol, Total: 225 mg/dL — ABNORMAL HIGH (ref 100–199)
HDL: 61 mg/dL (ref >39)
LDL Calculated: 134 mg/dL — ABNORMAL HIGH (ref 0–99)
Triglycerides: 150 mg/dL — ABNORMAL HIGH (ref 0–149)
VLDL Cholesterol Cal: 30 mg/dL (ref 5–40)

## 2015-11-29 LAB — FERRITIN: FERRITIN: 13 ng/mL — AB (ref 15–150)

## 2015-12-01 ENCOUNTER — Telehealth: Payer: Self-pay

## 2015-12-01 NOTE — Telephone Encounter (Signed)
Patient advised. Patient scheduled for 2 month follow up.

## 2015-12-01 NOTE — Telephone Encounter (Signed)
-----   Message from Tamsen Roersennis E Chrismon, [place] sent at 12/01/2015 12:01 AM EDT ----- Low normal hemoglobin. Iron low but slightly better than 7 months ago. Need to continue iron BID and add multivitamin with extra vitamin-C. Continue B12 tablet. Recheck blood levels in 2 months.

## 2015-12-10 ENCOUNTER — Other Ambulatory Visit: Payer: Self-pay | Admitting: Family Medicine

## 2016-01-15 IMAGING — CT CT ABD-PELV W/ CM
2 of 5 series · 16 of 46 positions shown, 18 images · IV contrast (isovue)
Comparison: Abdomen radiographs obtained earlier today and abdomen
and pelvis CT dated 07/10/2007.

CLINICAL DATA: Vomiting. Small bowel dilatation on recent
radiographs.

EXAM:
CT ABDOMEN AND PELVIS WITH CONTRAST
TECHNIQUE: Multidetector CT imaging of the abdomen and pelvis was performed
using the standard protocol following bolus administration of
intravenous contrast.
CONTRAST:  80 cc Isovue 300

[Series 2: routine abd pel with · axial · 0.90mm/px · z∈[-776,-382]mm · 13 of 93 slices shown, 15 images]
[im 7/93  soft-tissue]
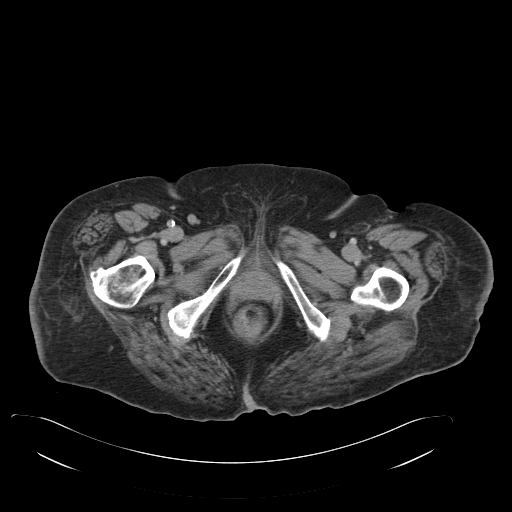
[im 7/93  bone]
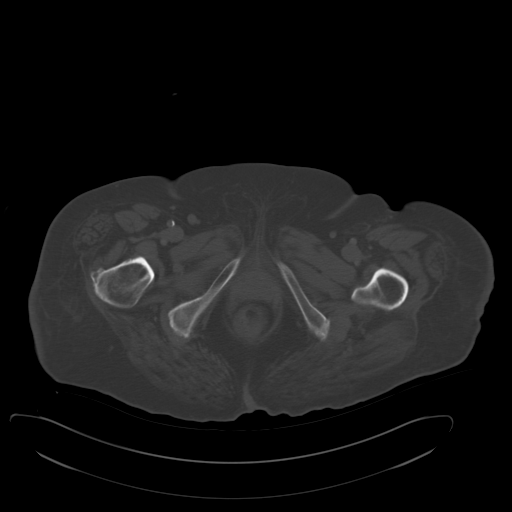
[im 14/93  soft-tissue]
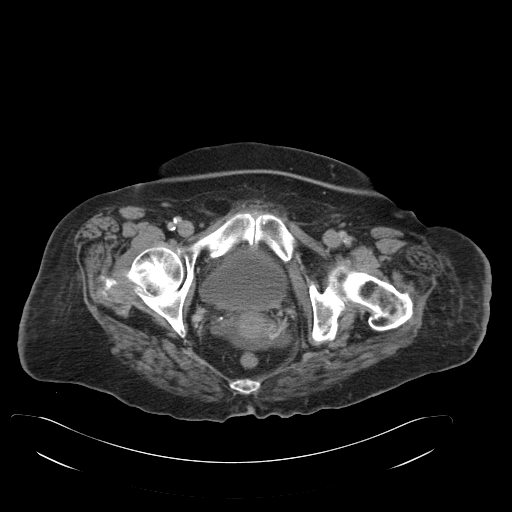
[im 20/93  soft-tissue]
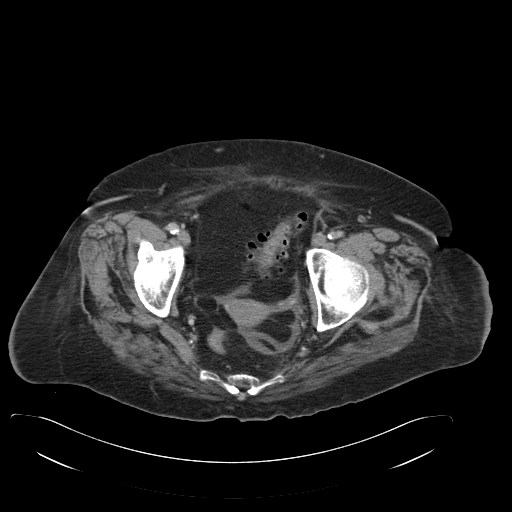
[im 27/93  soft-tissue]
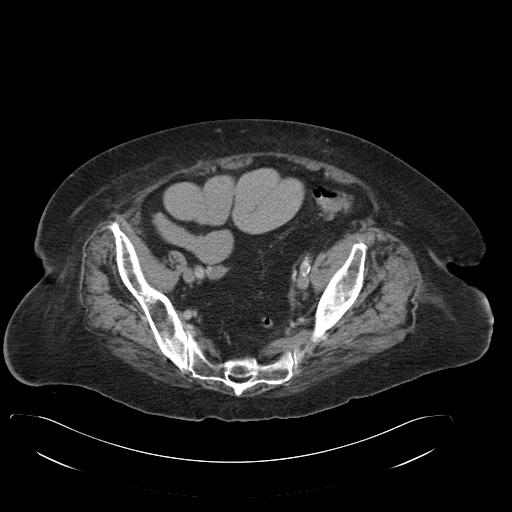
[im 33/93  soft-tissue]
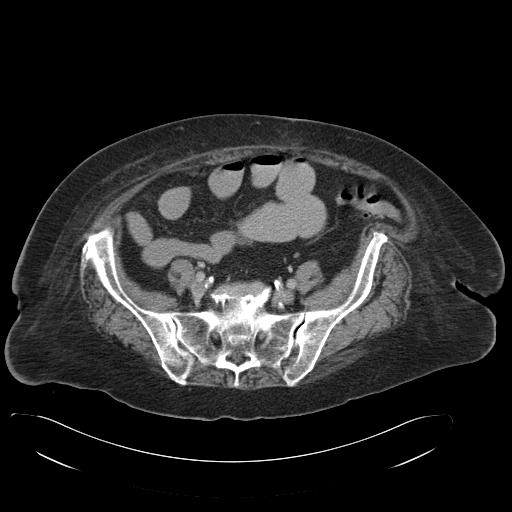
[im 40/93  soft-tissue]
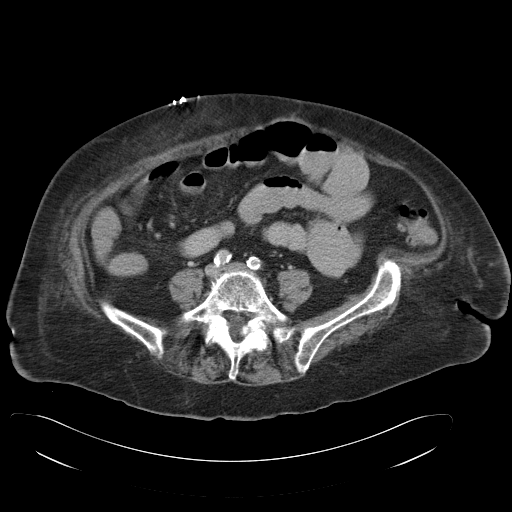
[im 47/93  soft-tissue]
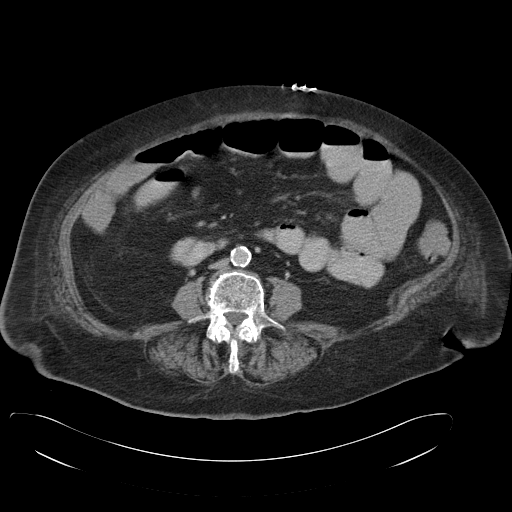
[im 53/93  soft-tissue]
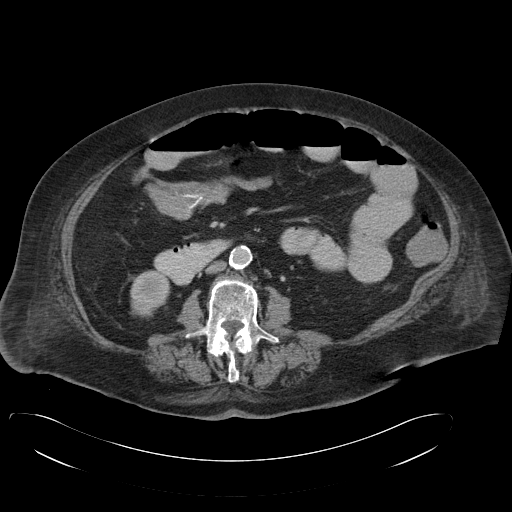
[im 60/93  soft-tissue]
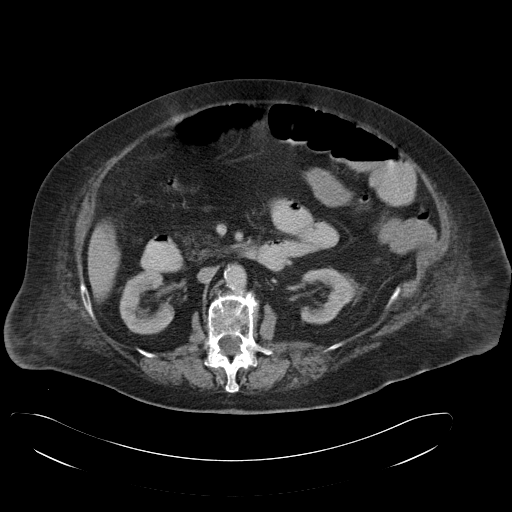
[im 60/93  bone]
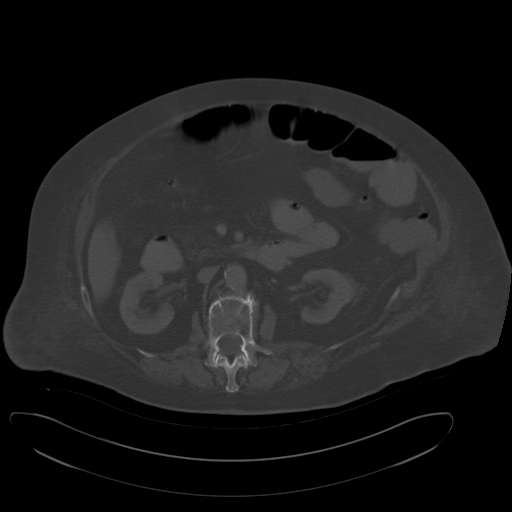
[im 66/93  soft-tissue]
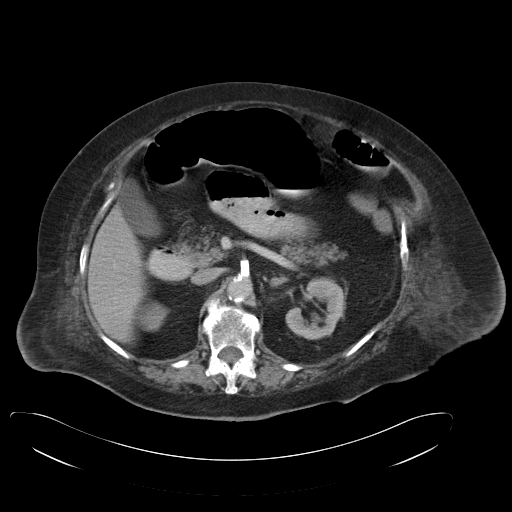
[im 73/93  soft-tissue]
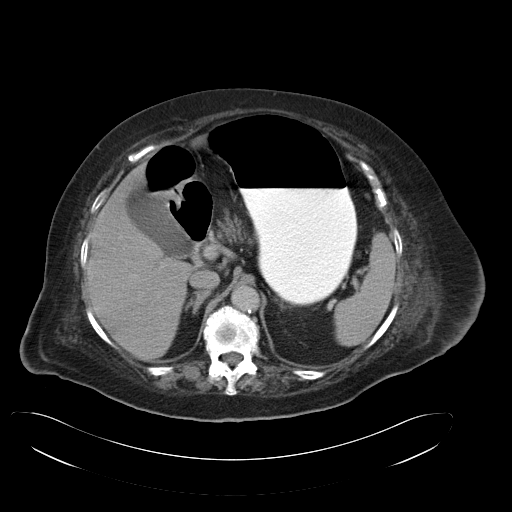
[im 79/93  soft-tissue]
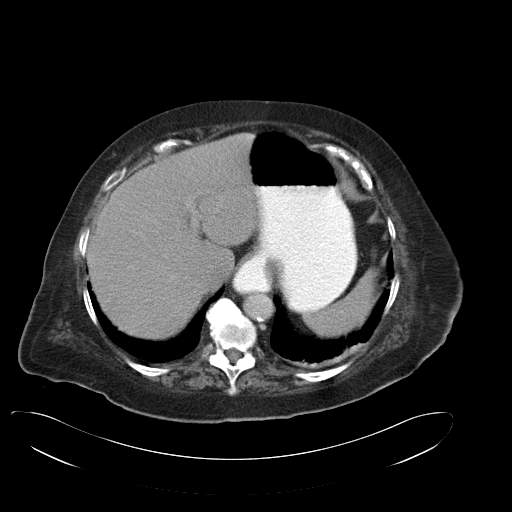
[im 86/93  soft-tissue]
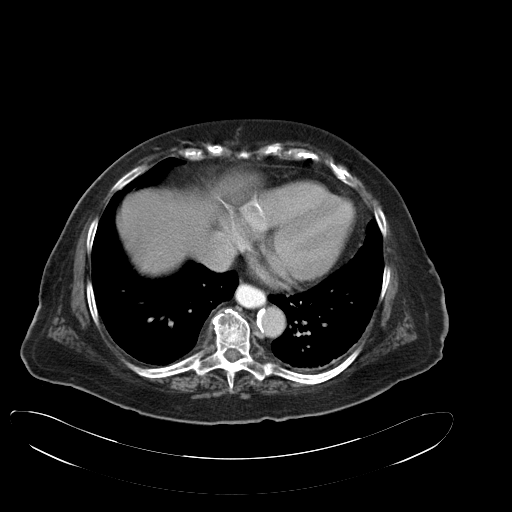

[Series 5: cor routine abd pel with · coronal · 0.95mm/px · 3 of 144 slices shown]
[im 48/144  soft-tissue]
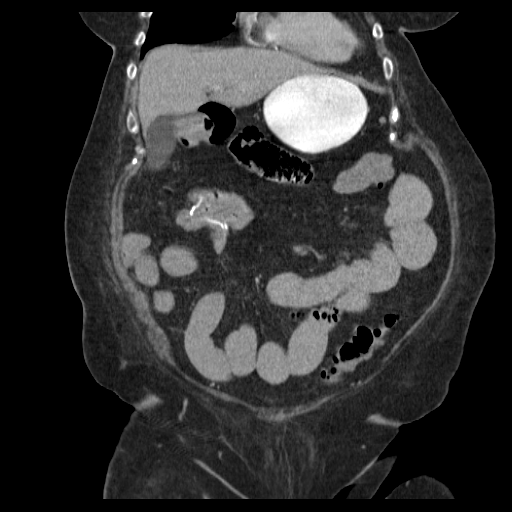
[im 64/144  soft-tissue]
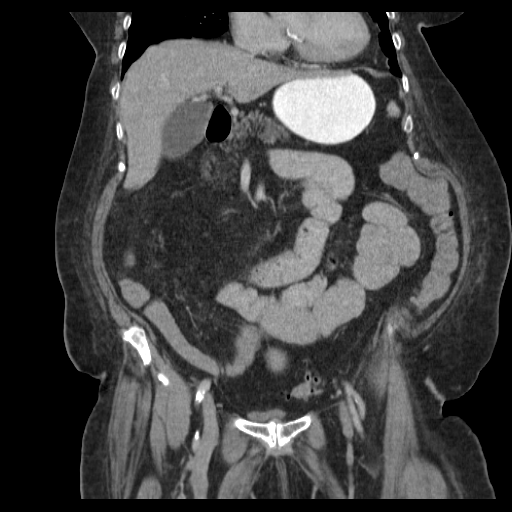
[im 80/144  soft-tissue]
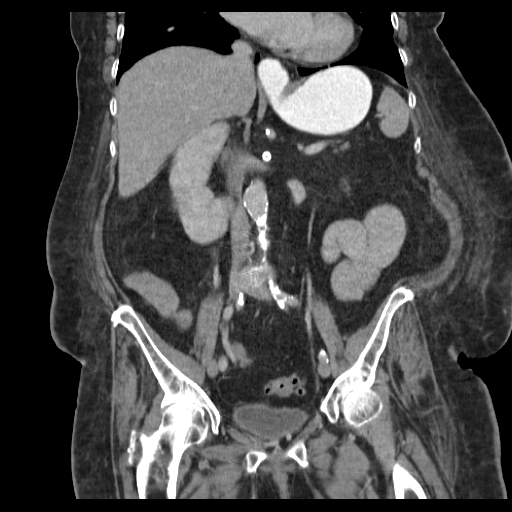

[16 of 46 positions shown; findings below may reference images not displayed]

FINDINGS: Postsurgical changes with right lower abdominal skin clips and
underlying edema in the subcutaneous fat. Multiple mildly to
moderately dilated small bowel loops with no transition point
identified. Large number of colonic diverticula without evidence of
diverticulitis. Distended stomach. No free peritoneal fluid or air
is seen.

Atheromatous arterial calcifications. These include the coronary
arteries. Small to moderate-sized sliding hiatal hernia with
refluxed contrast. Proximal superior mesenteric artery stent. Small
amount of free peritoneal fluid. No enlarged lymph nodes.

Unremarkable liver, spleen, pancreas, gallbladder, adrenal glands,
kidneys, urinary bladder, uterus and ovaries. Mildly prominent
interstitial markings at the lung bases. Bilateral L5 pars
interarticularis defects with associated grade 1 spondylolisthesis
at the L5-S1 level. Old T12 and L1 vertebral compression deformity
and associated Schmorl's nodes. Lumbar lower thoracic spine
degenerative changes, including changes of DISH.
IMPRESSION: 1. Postoperative small bowel ileus without definite obstruction.
2. Small amount of ascites.
3. Small a moderate sized sliding hiatal hernia with
gastroesophageal reflux.
4. Extensive chronic diverticulosis.
5. Bilateral L5 spondylolysis with associated grade 1
spondylolisthesis at the L5-S1 level.
6. Mild chronic interstitial lung disease.

## 2016-01-15 IMAGING — CR DG ABDOMEN 1V
1 series · 3 of 3 positions shown · non-contrast
Comparison: 07/29/2013

CLINICAL DATA: Vomiting

EXAM:
ABDOMEN - 1 VIEW

[Series 4: x abdomen supine · 0.14mm/px · 3 of 3 slices shown]
[im 1/3]
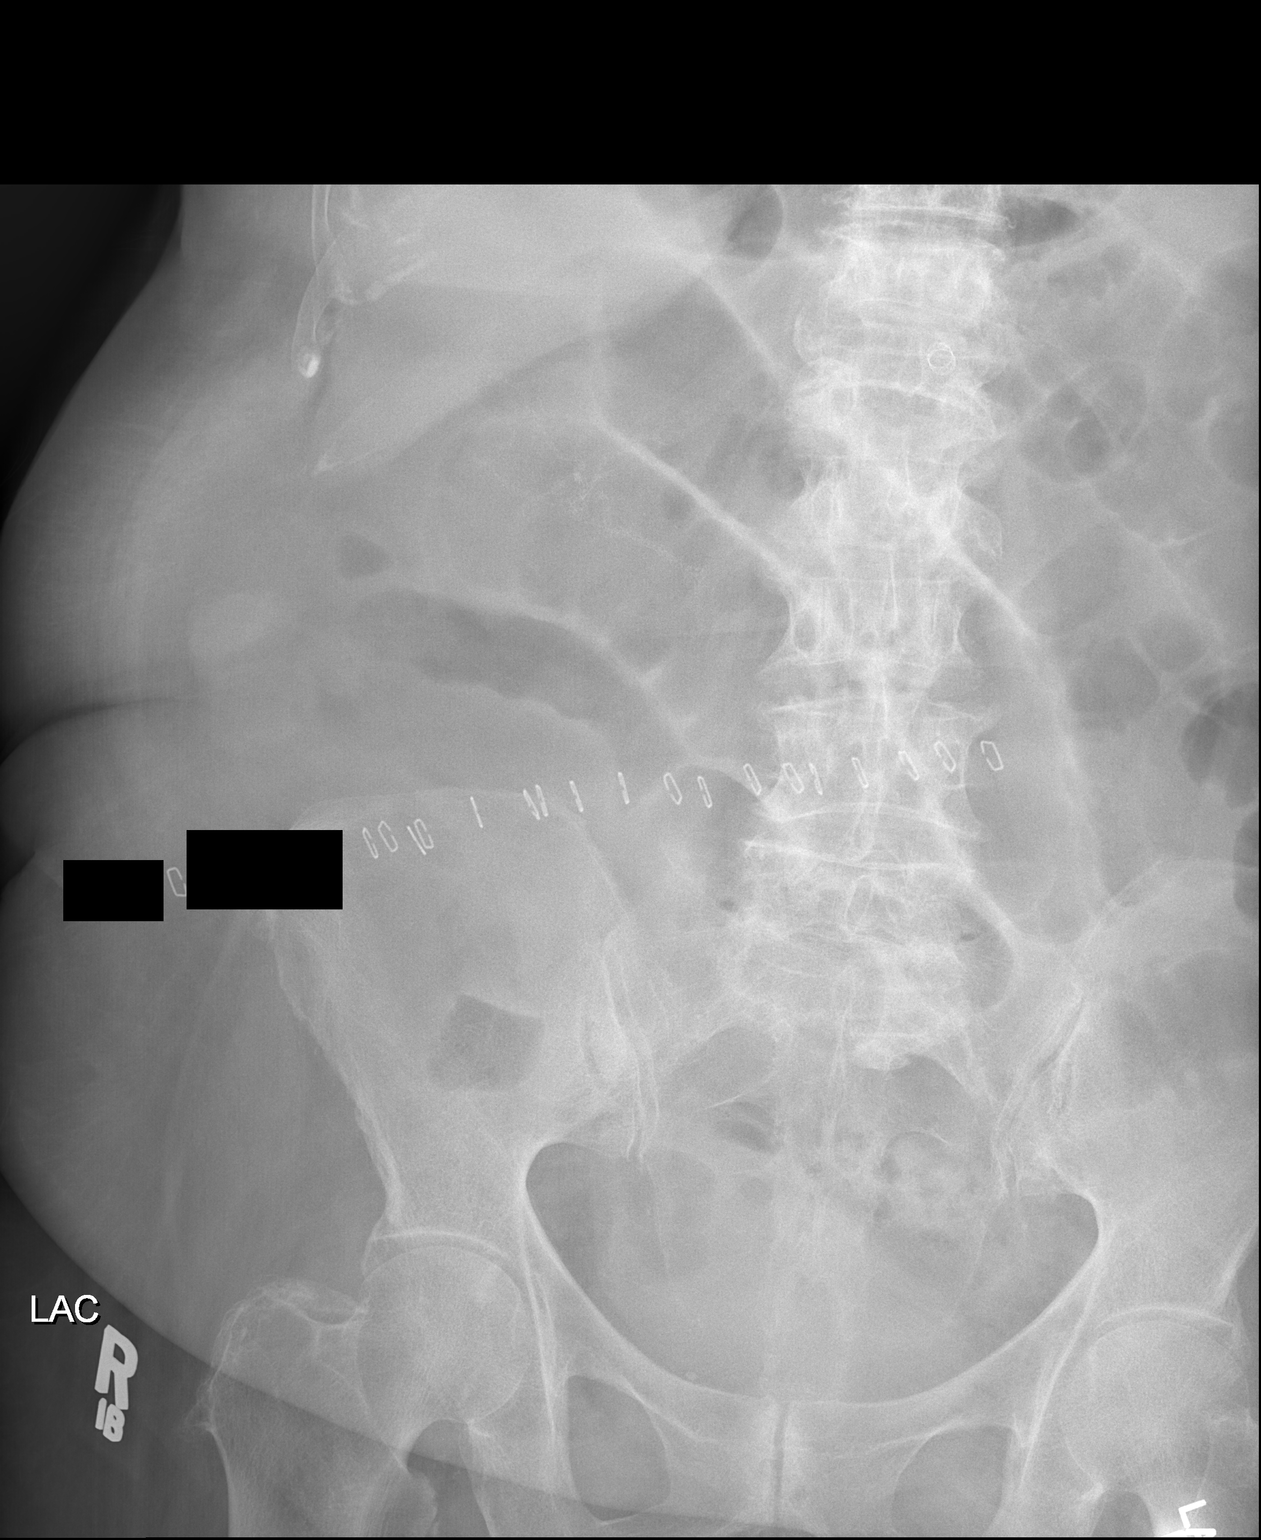
[im 2/3]
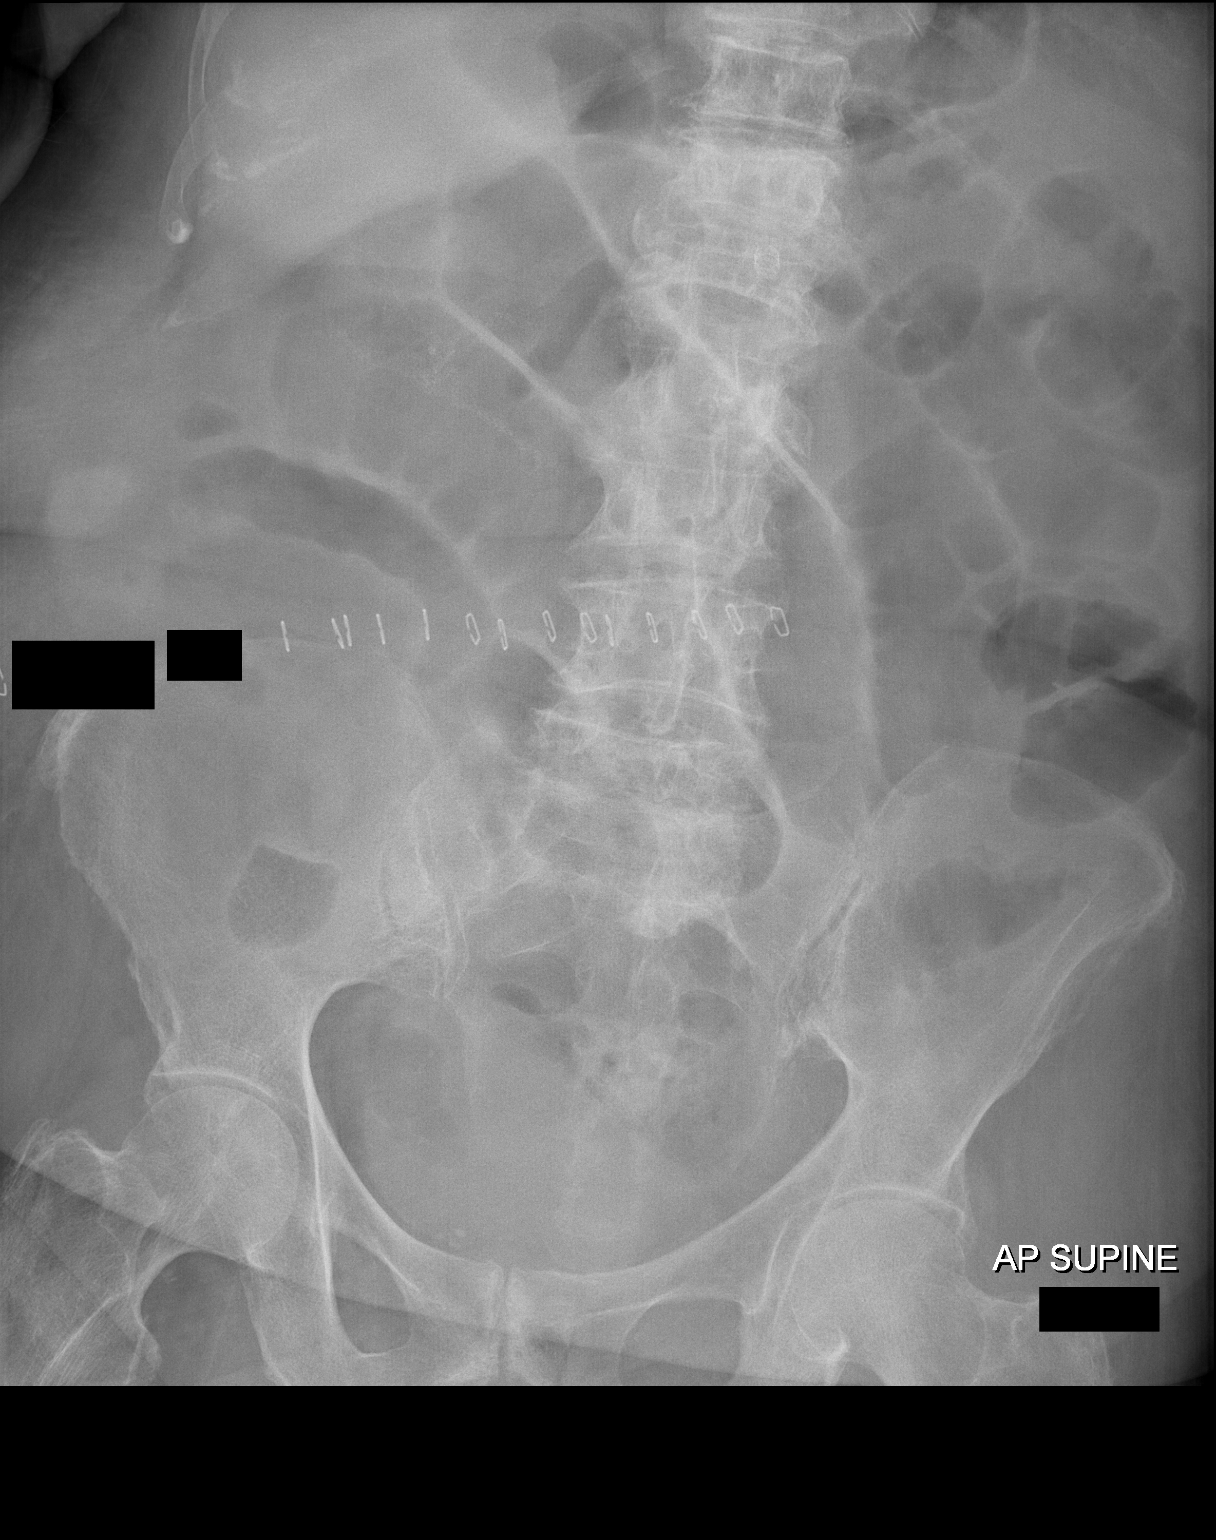
[im 3/3]
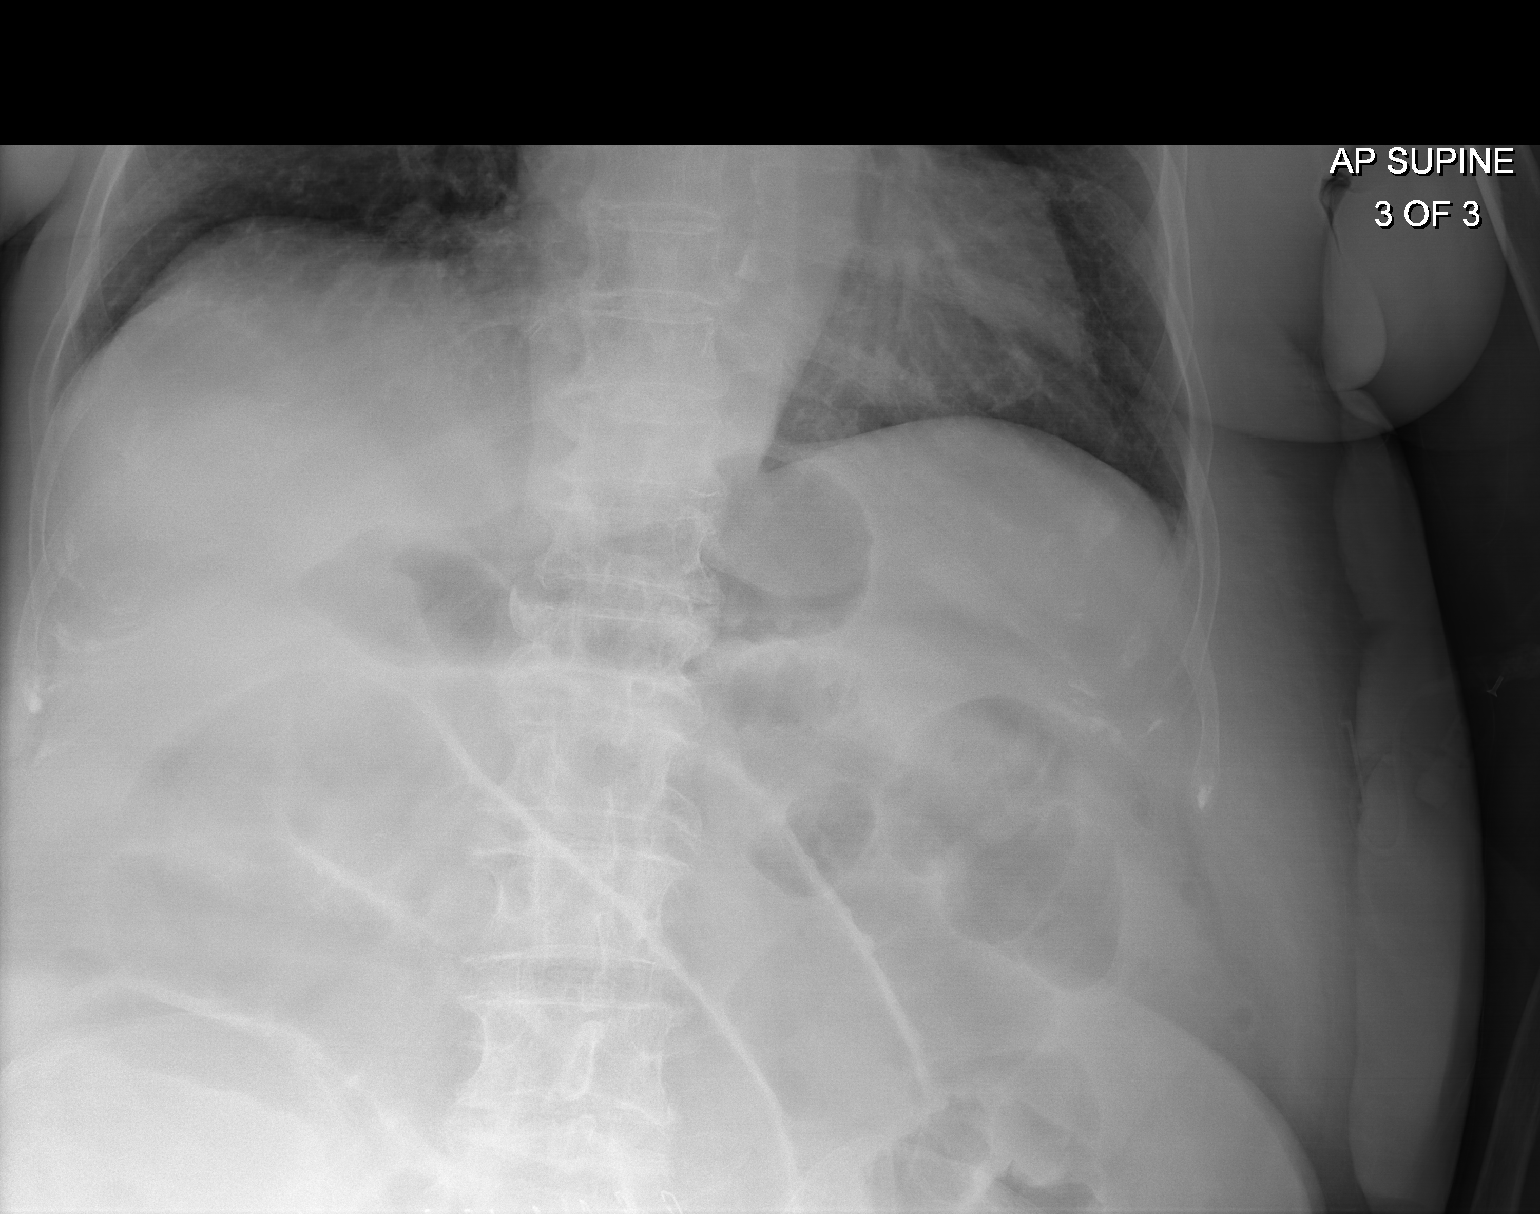

[3 of 3 positions shown; findings below may reference images not displayed]

FINDINGS: Postsurgical changes are again seen. Stable small bowel dilatation
is noted. No definitive free air is seen. A relative paucity of
colonic bowel gas is noted. No acute bony abnormality is seen.
IMPRESSION: Persistent small bowel dilatation consistent with a partial small
bowel obstruction or small-bowel ileus. Correlation with physical
exam is recommended.

## 2016-01-19 IMAGING — CR DG ABDOMEN 2V
1 series · 4 of 4 positions shown · non-contrast
Comparison: Abdominal 07/31/2013

CLINICAL DATA: pt has hx of ileus

EXAM:
ABDOMEN - 2 VIEW

[Series 7: x abdomen supine · 0.14mm/px · 4 of 4 slices shown]
[im 1/4]
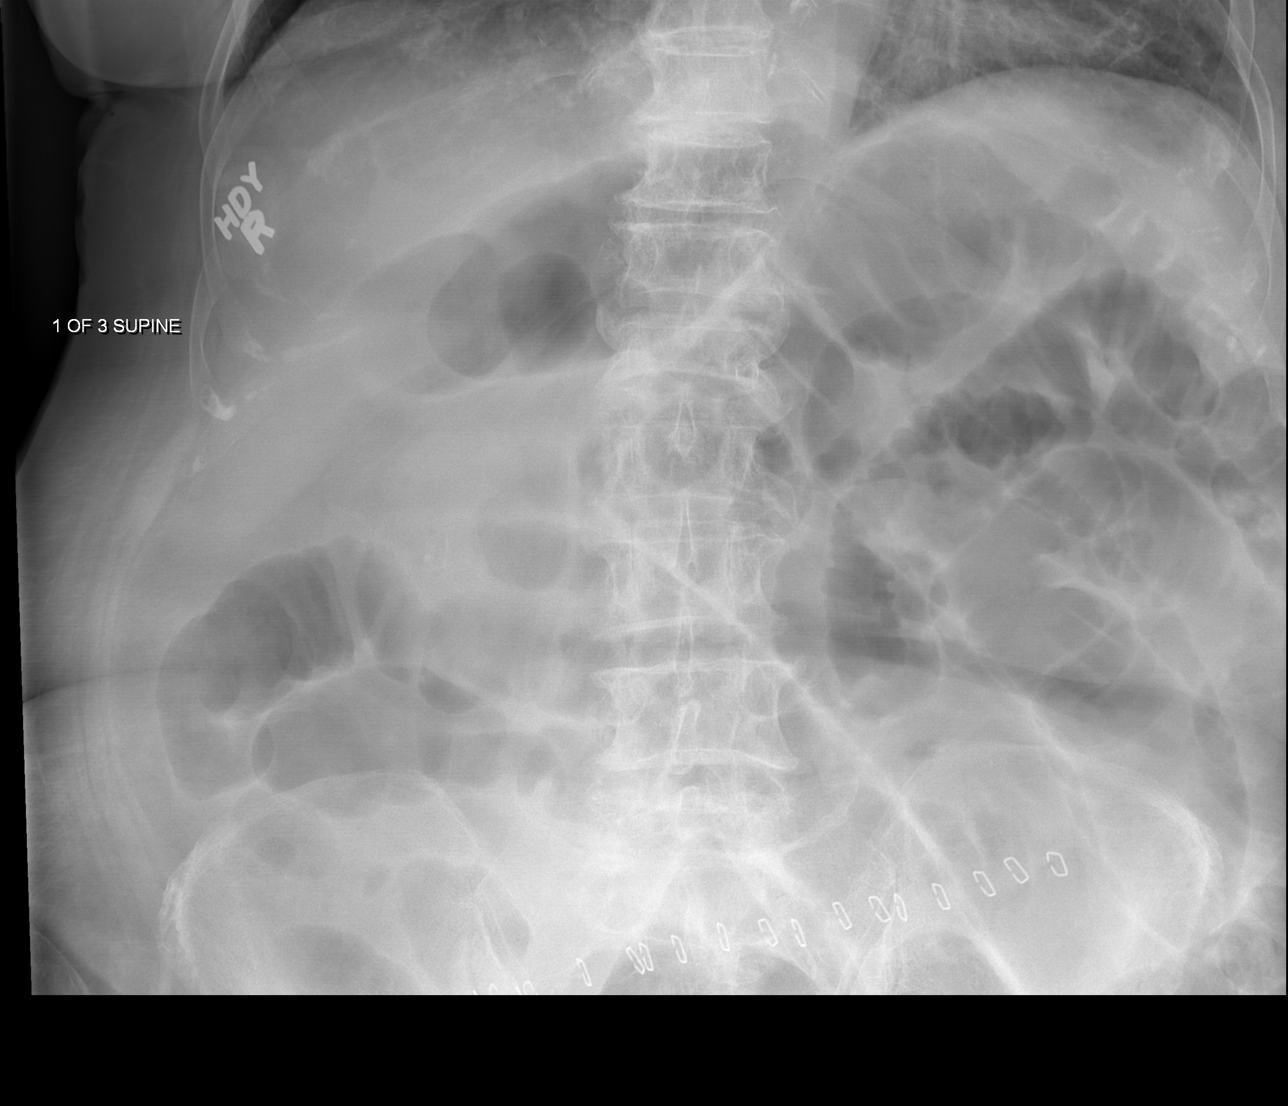
[im 2/4]
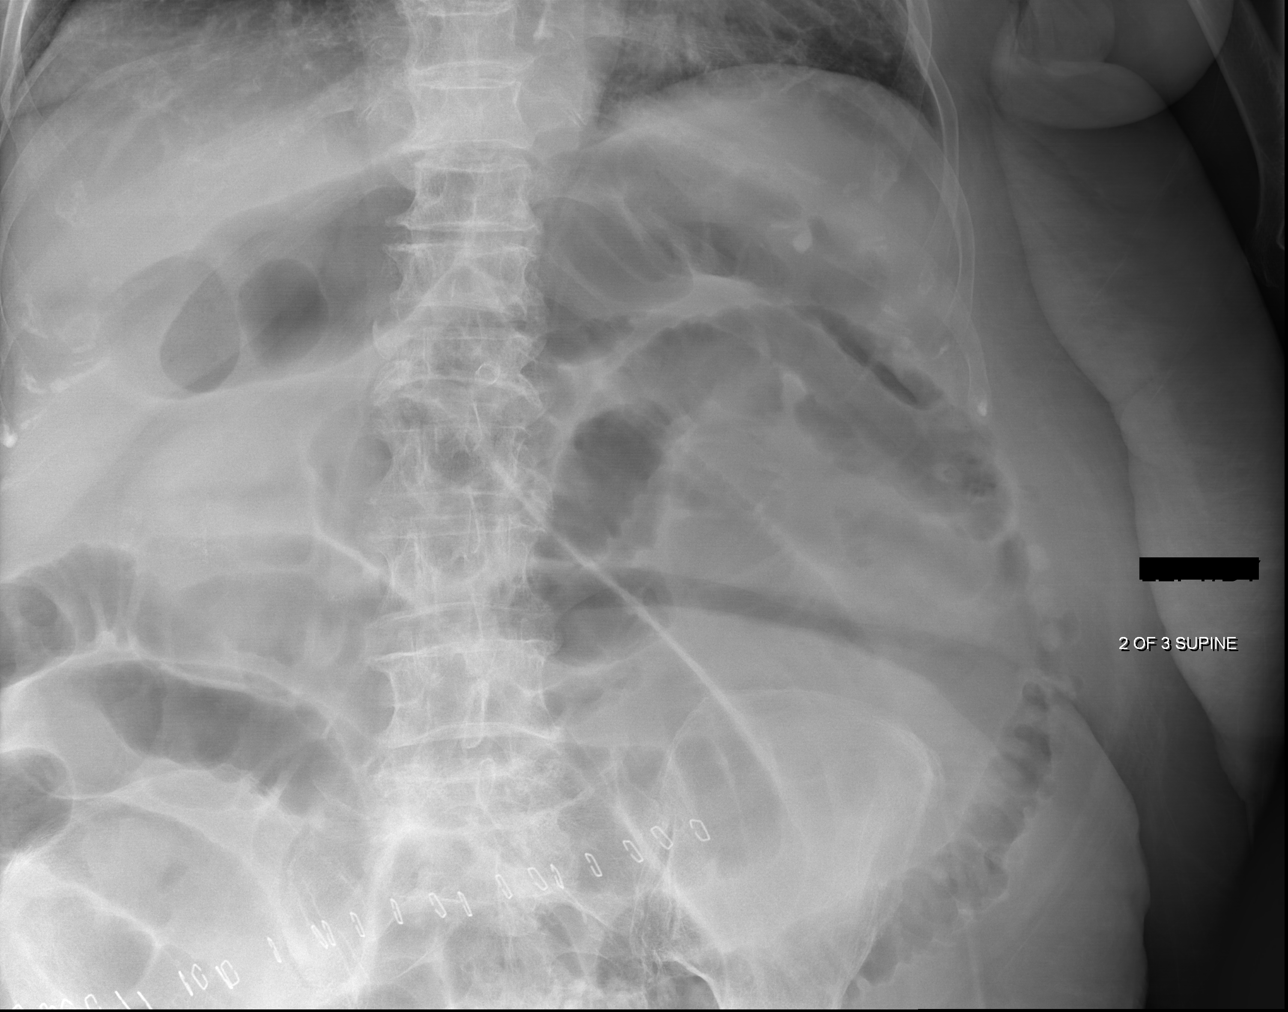
[im 3/4]
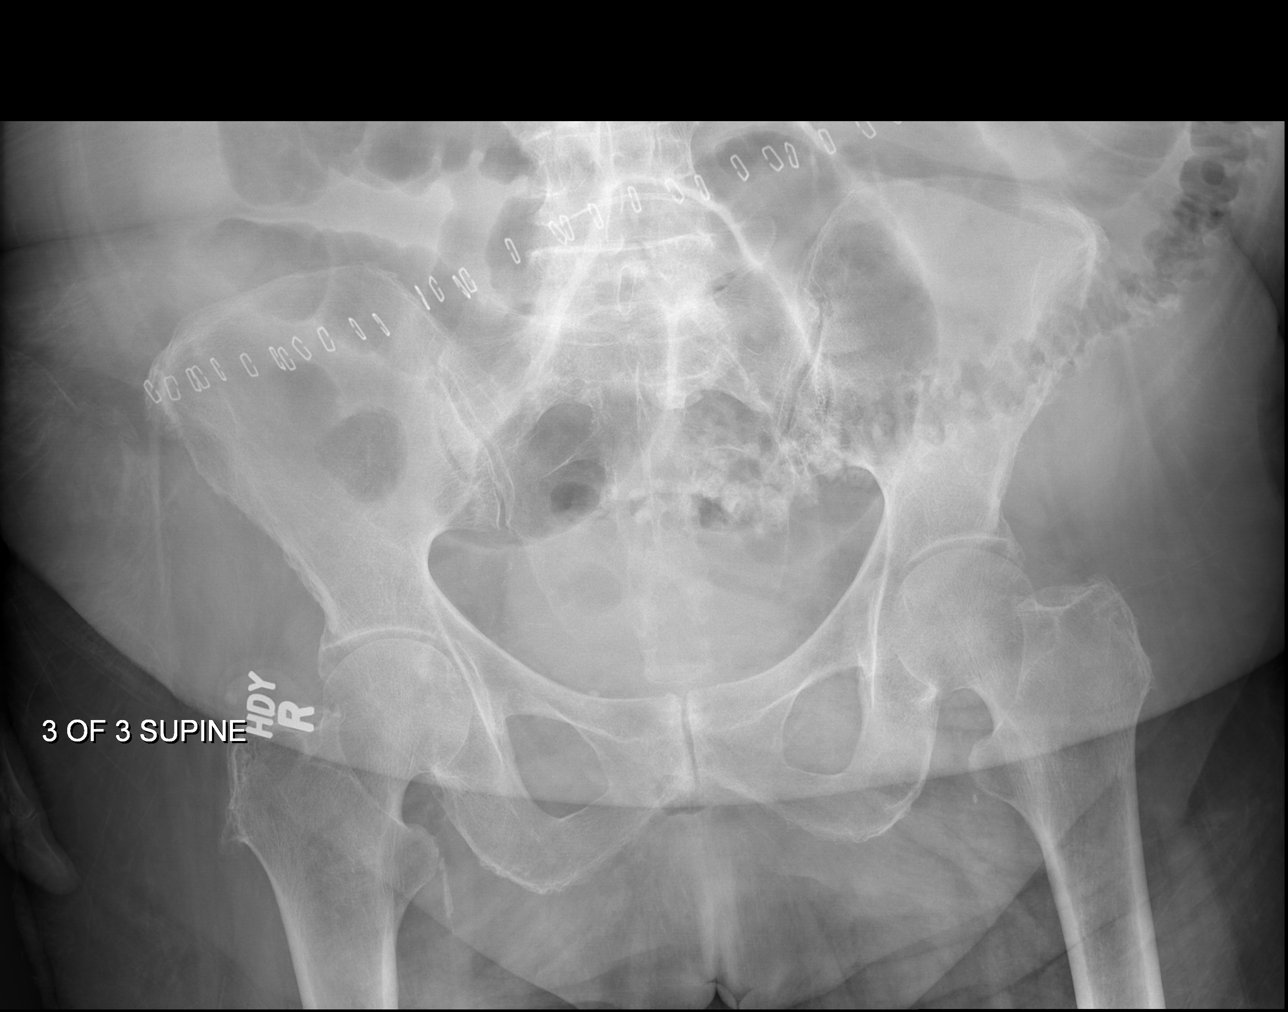
[im 4/4]
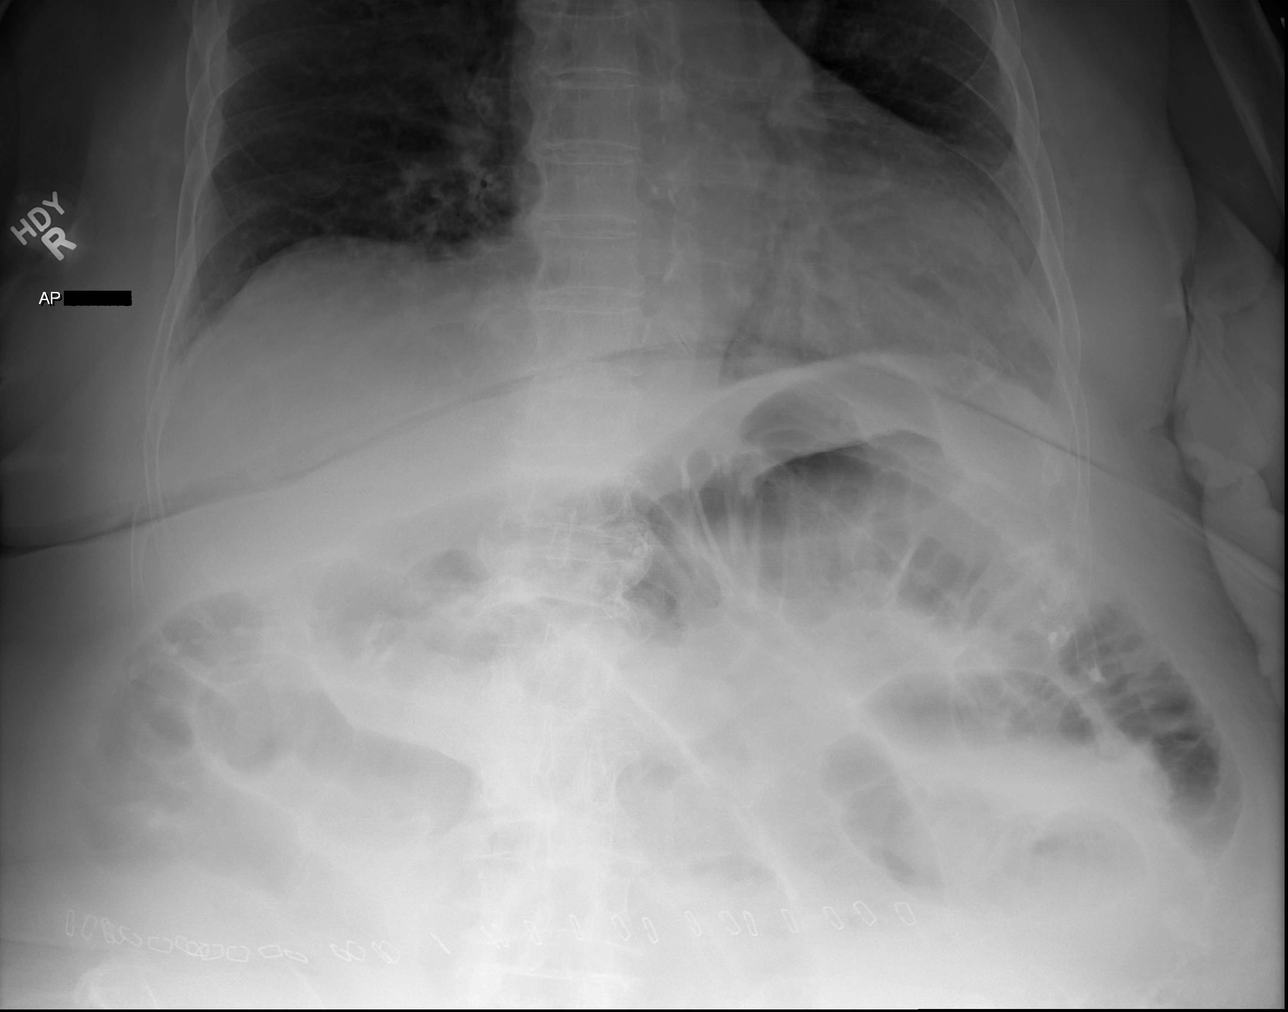

[4 of 4 positions shown; findings below may reference images not displayed]

FINDINGS: Cysts and areas of the dilated loops of small bowel and air-filled
loops of large bowel. Surgical staples across lower abdomen. No
acute osseous abnormalities.
IMPRESSION: Persistent dilated loops of small bowel in air-filled loops of large
bowel. Consistent with partial small bowel obstruction versus an
ileus.

## 2016-01-29 ENCOUNTER — Ambulatory Visit: Payer: Self-pay | Admitting: Family Medicine

## 2016-02-05 ENCOUNTER — Ambulatory Visit: Payer: Medicare Other | Admitting: Family Medicine

## 2016-02-18 ENCOUNTER — Ambulatory Visit: Payer: Medicare Other

## 2016-02-23 ENCOUNTER — Telehealth: Payer: Self-pay

## 2016-02-23 NOTE — Telephone Encounter (Signed)
Acknowledged.

## 2016-02-23 NOTE — Telephone Encounter (Signed)
Pt's daughter called concerned because pt has appointment with you tomorrow, and daughter can not accompany pt tomorrow. Daughter states pt is very forgetful, and pt would like to discuss chronic intermittent diarrhea. Advised daughter that you recommended Pepto-Bismol and a probiotic at LOV when this was discussed. Daughter states she is following this regimen, but is still experiencing the diarrhea. Daughter states the pt "went 7 times last night". Offered to change appointment to when daughter could join pt. Daughter declined. Daughter is requesting for us to call her with lab results, etc. Allene DillonEmily Drozdowski, CMA

## 2016-02-24 ENCOUNTER — Encounter: Payer: Self-pay | Admitting: Family Medicine

## 2016-02-24 ENCOUNTER — Ambulatory Visit (INDEPENDENT_AMBULATORY_CARE_PROVIDER_SITE_OTHER): Payer: Medicare Other

## 2016-02-24 ENCOUNTER — Ambulatory Visit (INDEPENDENT_AMBULATORY_CARE_PROVIDER_SITE_OTHER): Payer: Medicare Other | Admitting: Family Medicine

## 2016-02-24 VITALS — BP 145/73 | HR 84 | Temp 98.7°F | Ht 64.0 in | Wt 173.0 lb

## 2016-02-24 VITALS — BP 145/73 | HR 88 | Temp 98.7°F | Wt 173.0 lb

## 2016-02-24 DIAGNOSIS — E785 Hyperlipidemia, unspecified: Secondary | ICD-10-CM | POA: Diagnosis not present

## 2016-02-24 DIAGNOSIS — R197 Diarrhea, unspecified: Secondary | ICD-10-CM

## 2016-02-24 DIAGNOSIS — Z9889 Other specified postprocedural states: Secondary | ICD-10-CM | POA: Diagnosis not present

## 2016-02-24 DIAGNOSIS — Z9049 Acquired absence of other specified parts of digestive tract: Secondary | ICD-10-CM

## 2016-02-24 DIAGNOSIS — E611 Iron deficiency: Secondary | ICD-10-CM

## 2016-02-24 DIAGNOSIS — M810 Age-related osteoporosis without current pathological fracture: Secondary | ICD-10-CM | POA: Diagnosis not present

## 2016-02-24 DIAGNOSIS — Z0000 Encounter for general adult medical examination without abnormal findings: Secondary | ICD-10-CM | POA: Diagnosis not present

## 2016-02-24 NOTE — Progress Notes (Signed)
Patient: Kerri Ramirez Female    DOB: 1925-07-20   83 y.o.   MRN: [id number] Visit Date: 02/24/2016  Today's Provider: Dortha Kern, PA   Chief Complaint  Patient presents with  . Anemia  . Hyperlipidemia  . Follow-up   Subjective:    HPI Patient is here to follow up from AWE that was done on 02/24/2016. Patient is doing well with medication regimen. She complains of leg swelling and diarrhea. Chronic intermittent diarrhea since surgery. Advised patient to use Pepto-Bismol and probiotics. Patient has been following this regimen, but still having diarrhea several times a day.      Lipid/Cholesterol, Follow-up:   Last seen for this 2 months ago.  Management since that visit includes continue medication.  Last Lipid Panel:    Component Value Date/Time   CHOL 225 (H) 11/28/2015 0911   TRIG 150 (H) 11/28/2015 0911   HDL 61 11/28/2015 0911   CHOLHDL 3.7 11/28/2015 0911   LDLCALC 134 (H) 11/28/2015 0911    She reports good compliance with treatment. She is not having side effects.   Wt Readings from Last 3 Encounters:  02/24/16 173 lb (78.5 kg)  02/24/16 173 lb (78.5 kg)  11/28/15 170 lb 12.8 oz (77.5 kg)    ------------------------------------------------------------------------ Past Medical History:  Diagnosis Date  . Hypertension    Patient Active Problem List   Diagnosis Date Noted  . Ventral hernia without obstruction or gangrene 05/09/2015  . Short-term memory loss 04/28/2015  . Absolute anemia 10/29/2014  . Acute thromboembolism of deep veins of lower extremity (HCC) 10/29/2014  . Acid reflux 10/29/2014  . Borderline diabetes 10/29/2014  . Gout 10/29/2014  . 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 HLD (hyperlipidemia) 10/29/2014  . BP (high blood pressure) 10/29/2014  . Decreased potassium in the blood 10/29/2014  . Iron deficiency 10/29/2014  . Colitis, ischemic (HCC) 10/29/2014  . Elevated WBC count 10/29/2014  . LBP (low back pain) 10/29/2014  .  Amnesia 10/29/2014  . Adiposity 10/29/2014  . OP (osteoporosis) 10/29/2014  . Addison anemia 10/29/2014  . Diarrhea 10/29/2014  . Telogen effluvium 10/29/2014  . Tubular adenoma of colon 11/22/1997   Past Surgical History:  Procedure Laterality Date  . ANKLE SURGERY Left 1981  . BACK SURGERY  1987   broken vertebra  . COLONOSCOPY     Dr Mechele Collin  . HEMICOLECTOMY Right 07-25-13   Dr Byrd/diverticulitis  . IVC FILTER PLACEMENT ([facility] HX)  09/01/2013   Family History  Problem Relation Age of Onset  . Heart attack Father   . Heart attack Brother   . Heart attack Brother    Allergies  Allergen Reactions  . Codeine Other (See Comments)    Made head spin      Previous Medications   ALENDRONATE (FOSAMAX) 70 MG TABLET    TAKE 1 TABLET BY MOUTH ONCE A WEEK ON EMPTY STOMACH WITH A FULL GLASSOF WATER   ASCORBIC ACID (VITAMIN C IMMUNE HEALTH) 500 MG CHEW    Chew by mouth daily.   CYANOCOBALAMIN 1000 MCG TABLET    Take by mouth.   FERREX 150 150 MG CAPSULE    TAKE ONE (1) CAPSULE BY MOUTH 2 TIMES DAILY   FERROUS SULFATE 325 (65 FE) MG TABLET    Take 325 mg by mouth 2 (two) times daily with a meal.   MULTIPLE VITAMINS-MINERALS (CENTRUM SILVER 50+WOMEN) TABS    Take by mouth.   OMEGA-3 FATTY ACIDS (FISH OIL) 1000 MG CAPS  Take by mouth.   PROBIOTIC CAPS    Take by mouth.    Review of Systems  Constitutional: Negative.   Respiratory: Negative.   Cardiovascular: Positive for leg swelling.  Gastrointestinal: Positive for diarrhea.    Social History  Substance Use Topics  . Smoking status: Former Smoker    Years: 2.00    Types: Cigarettes    Quit date: 02/16/1944  . Smokeless tobacco: Never Used  . Alcohol use No   Objective:   BP (!) 145/73   Pulse 88   Temp 98.7 F (37.1 C) (Oral)   Wt 173 lb (78.5 kg)   BMI 29.70 kg/m   Physical Exam  Constitutional: She appears well-developed and well-nourished.  HENT:  Head: Normocephalic and atraumatic.  Right Ear: External ear  normal.  Left Ear: External ear normal.  Nose: Nose normal.  Mouth/Throat: Oropharynx is clear and moist.  Eyes: Conjunctivae and EOM are normal. Pupils are equal, round, and reactive to light. Right eye exhibits no discharge.  Black spot at the peripheral border of the left iris at 6 o'clock position from trauma as a child. No change in vision recently.  Neck: Normal range of motion. Neck supple. No tracheal deviation present. No thyromegaly present.  Cardiovascular: Normal rate, regular rhythm, normal heart sounds and intact distal pulses.   No murmur heard. Pulmonary/Chest: Effort normal and breath sounds normal. No respiratory distress. She has no wheezes. She has no rales. She exhibits no tenderness.  Abdominal: Soft. Bowel sounds are normal. She exhibits no distension and no mass. There is no tenderness. There is no rebound and no guarding.  Well healed scars from right hemicolectomy in 2015.  Musculoskeletal: She exhibits edema. She exhibits no tenderness.  Many varicose veins of the legs with slight puffiness of the right lower leg.   Lymphadenopathy:    She has no cervical adenopathy.  Neurological: She is alert. She has normal reflexes. No cranial nerve deficit. She exhibits normal muscle tone. Coordination normal.  Skin: Skin is warm and dry. No rash noted. No erythema.  Psychiatric: She has a normal mood and affect. Her behavior is normal. Judgment and thought content normal.      Assessment & Plan:    1. Hyperlipidemia, unspecified hyperlipidemia type Tolerating the Omega-3 fish oil and admits to a poor diet plan. Will recheck labs and encouraged to get back on the low fat diet. Had Medicare AWE today. Refused immunizations. Will get routine labs today. - Comprehensive metabolic panel - Lipid panel - TSH  2. Iron deficiency Still taking Ferrous Sulfate 325 mg BID. States she feels well. Will recheck labs. - CBC with Differential/Platelet - Iron - Iron and TIBC  3. Hx  of right hemicolectomy Right hemicolectomy due to diverticulitis per Dr. Egbert GaribaldiBird (surgeon). Has had loose stools since the surgery in 2015.  4. Diarrhea, unspecified type Chronic and intermittent since having right hemicolectomy for diverticulitis. States use of Pepto-Bismol helps control it. No abdominal pains or fevers.  5. Osteoporosis, unspecified osteoporosis type, unspecified pathological fracture presence Continues to tolerate Alendronate once a week. Tolerating it well without nausea or reflux complaints.

## 2016-02-24 NOTE — Patient Instructions (Signed)
[name] , Thank you for taking time to come for your Medicare Wellness Visit. I appreciate your ongoing commitment to your health goals. Please review the following plan we discussed and let me know if I can assist you in the future.   These are the goals we discussed: Goals    . Increase water intake          Starting 02/24/16, I will increase my water intake to 6 glasses a day.       This is a list of the screening recommended for you and due dates:  Health Maintenance  Topic Date Due  . Tetanus Vaccine  07/08/1946  . Shingles Vaccine  07/08/1987  . Pneumonia vaccines (2 of 2 - PPSV23) 12/04/2014  . Flu Shot  Completed  . DEXA scan (bone density measurement)  Completed   Preventive Care for Adults  A healthy lifestyle and preventive care can promote health and wellness. Preventive health guidelines for adults include the following key practices.  . A routine yearly physical is a good way to check with your health care provider about your health and preventive screening. It is a chance to share any concerns and updates on your health and to receive a thorough exam.  . Visit your dentist for a routine exam and preventive care every 6 months. Brush your teeth twice a day and floss once a day. Good oral hygiene prevents tooth decay and gum disease.  . The frequency of eye exams is based on your age, health, family medical history, use  of contact lenses, and other factors. Follow your health care provider's ecommendations for frequency of eye exams.  . Eat a healthy diet. Foods like vegetables, fruits, whole grains, low-fat dairy products, and lean protein foods contain the nutrients you need without too many calories. Decrease your intake of foods high in solid fats, added sugars, and salt. Eat the right amount of calories for you. Get information about a proper diet from your health care provider, if necessary.  . Regular physical exercise is one of the most important things you  can do for your health. Most adults should get at least 150 minutes of moderate-intensity exercise (any activity that increases your heart rate and causes you to sweat) each week. In addition, most adults need muscle-strengthening exercises on 2 or more days a week.  Silver Sneakers may be a benefit available to you. To determine eligibility, you may visit the website: www.silversneakers.com or contact program at [phone] Mon-Fri between 8AM-8PM.   . Maintain a healthy weight. The body mass index (BMI) is a screening tool to identify possible weight problems. It provides an estimate of body fat based on height and weight. Your health care provider can find your BMI and can help you achieve or maintain a healthy weight.   For adults 20 years and older: ? A BMI below 18.5 is considered underweight. ? A BMI of 18.5 to 24.9 is normal. ? A BMI of 25 to 29.9 is considered overweight. ? A BMI of 30 and above is considered obese.   . Maintain normal blood lipids and cholesterol levels by exercising and minimizing your intake of saturated fat. Eat a balanced diet with plenty of fruit and vegetables. Blood tests for lipids and cholesterol should begin at age 81 and be repeated every 5 years. If your lipid or cholesterol levels are high, you are over 50, or you are at high risk for heart disease, you may need your cholesterol levels  checked more frequently. Ongoing high lipid and cholesterol levels should be treated with medicines if diet and exercise are not working.  . If you smoke, find out from your health care provider how to quit. If you do not use tobacco, please do not start.  . If you choose to drink alcohol, please do not consume more than 2 drinks per day. One drink is considered to be 12 ounces (355 mL) of beer, 5 ounces (148 mL) of wine, or 1.5 ounces (44 mL) of liquor.  . If you are 71-50 years old, ask your health care provider if you should take aspirin to prevent strokes.  . Use  sunscreen. Apply sunscreen liberally and repeatedly throughout the day. You should seek shade when your shadow is shorter than you. Protect yourself by wearing long sleeves, pants, a wide-brimmed hat, and sunglasses year round, whenever you are outdoors.  . Once a month, do a whole body skin exam, using a mirror to look at the skin on your back. Tell your health care provider of new moles, moles that have irregular borders, moles that are larger than a pencil eraser, or moles that have changed in shape or color.

## 2016-02-24 NOTE — Progress Notes (Signed)
Subjective:   Kerri Ramirez is a 83 y.o. female who presents for Medicare Annual (Subsequent) preventive examination.  Review of Systems:  N/A   Cardiac Risk Factors include: dyslipidemia;hypertension;advanced age (>4855men, 9>65 women)     Objective:     Vitals: BP (!) 145/73 (BP Location: Left Arm)   Pulse 84   Temp 98.7 F (37.1 C) (Oral)   Ht 5\' 4"  (1.626 m)   Wt 173 lb (78.5 kg)   BMI 29.70 kg/m   Body mass index is 29.7 kg/m.   Tobacco History  Smoking Status  . Former Smoker  . Years: 2.00  . Types: Cigarettes  . Quit date: 02/16/1944  Smokeless Tobacco  . Never Used     Counseling given: Not Answered   Past Medical History:  Diagnosis Date  . Hypertension    Past Surgical History:  Procedure Laterality Date  . ANKLE SURGERY Left 1981  . BACK SURGERY  1987   broken vertebra  . COLONOSCOPY     Dr Mechele CollinElliott  . HEMICOLECTOMY Right 07-25-13   Dr Byrd/diverticulitis  . IVC FILTER PLACEMENT ([facility] HX)  09/01/2013   Family History  Problem Relation Age of Onset  . Heart attack Father   . Heart attack Brother   . Heart attack Brother    History  Sexual Activity  . Sexual activity: Not on file    Outpatient Encounter Prescriptions as of 02/24/2016  Medication Sig  . alendronate (FOSAMAX) 70 MG tablet TAKE 1 TABLET BY MOUTH ONCE A WEEK ON EMPTY STOMACH WITH A FULL GLASSOF WATER  . Ascorbic Acid (VITAMIN C IMMUNE HEALTH) 500 MG CHEW Chew by mouth daily.  . cyanocobalamin 1000 MCG tablet Take by mouth.  . FERREX 150 150 MG capsule TAKE ONE (1) CAPSULE BY MOUTH 2 TIMES DAILY  . Multiple Vitamins-Minerals (CENTRUM SILVER 50+WOMEN) TABS Take by mouth.  . Omega-3 Fatty Acids (FISH OIL) 1000 MG CAPS Take by mouth.  . Probiotic CAPS Take by mouth.  . ferrous sulfate 325 (65 FE) MG tablet Take 325 mg by mouth 2 (two) times daily with a meal.   No facility-administered encounter medications on file as of 02/24/2016.     Activities of Daily Living In your  present state of health, do you have any difficulty performing the following activities: 02/24/2016  Hearing? Y  Vision? N  Difficulty concentrating or making decisions? N  Walking or climbing stairs? N  Dressing or bathing? N  Doing errands, shopping? Y  Preparing Food and eating ? N  Using the Toilet? N  In the past six months, have you accidently leaked urine? N  Do you have problems with loss of bowel control? N  Managing your Medications? N  Managing your Finances? Y  Housekeeping or managing your Housekeeping? N  Some recent data might be hidden    Patient Care Team: Tamsen Roersennis E Chrismon, PA as PCP - General (Family Medicine) Earline MayotteJeffrey W Byrnett, MD (General Surgery)    Assessment:     Exercise Activities and Dietary recommendations Current Exercise Habits: The patient does not participate in regular exercise at present, Exercise limited by: orthopedic condition(s)  Goals    . Increase water intake          Starting 02/24/16, I will increase my water intake to 6 glasses a day.      Fall Risk Fall Risk  02/24/2016 10/29/2014  Falls in the past year? No No   Depression Screen PHQ 2/9 Scores 02/24/2016  10/29/2014  PHQ - 2 Score 0 0     Cognitive Function     6CIT Screen 02/24/2016  What Year? 0 points  What month? 0 points  What time? 0 points  Count back from 20 2 points  Months in reverse 0 points  Repeat phrase 6 points  Total Score 8    Immunization History  Administered Date(s) Administered  . Influenza Split 11/10/2011  . Influenza, High Dose Seasonal PF 12/03/2013, 10/29/2014, 11/28/2015  . Influenza,inj,Quad PF,36+ Mos 12/06/2012  . Influenza-Unspecified 11/10/2011, 12/06/2012, 12/03/2013, 10/29/2014  . Pneumococcal Conjugate-13 12/03/2013   Screening Tests Health Maintenance  Topic Date Due  . TETANUS/TDAP  02/15/2017 (Originally 07/08/1944)  . PNA vac Low Risk Adult (2 of 2 - PPSV23) 02/15/2017 (Originally 12/04/2014)  . ZOSTAVAX  02/15/2026 (Originally  07/08/1987)  . INFLUENZA VACCINE  Completed  . DEXA SCAN  Completed      Plan:  I have personally reviewed and addressed the Medicare Annual Wellness questionnaire and have noted the following in the patient's chart:  A. Medical and social history B. Use of alcohol, tobacco or illicit drugs  C. Current medications and supplements D. Functional ability and status E.  Nutritional status F.  Physical activity G. Advance directives H. List of other physicians I.  Hospitalizations, surgeries, and ER visits in previous 12 months J.  Vitals K. Screenings such as hearing and vision if needed, cognitive and depression L. Referrals and appointments - none  In addition, I have reviewed and discussed with patient certain preventive protocols, quality metrics, and best practice recommendations. A written personalized care plan for preventive services as well as general preventive health recommendations were provided to patient.  See attached scanned questionnaire for additional information.   Signed,  Hyacinth Meeker, LPN Nurse Health Advisor   MD Recommendations: Follow up AWV, pt declined tetanus, pneumovax 23 and Zostavax vaccine today.   I have reviewed the health advisor's note, was available for consultation and agree with documentation and plan.

## 2016-02-25 LAB — CBC WITH DIFFERENTIAL/PLATELET
BASOS ABS: 0 10*3/uL (ref 0.0–0.2)
Basos: 0 %
EOS (ABSOLUTE): 0.1 10*3/uL (ref 0.0–0.4)
Eos: 1 %
HEMOGLOBIN: 11.7 g/dL (ref 11.1–15.9)
Hematocrit: 35.3 % (ref 34.0–46.6)
Immature Grans (Abs): 0 10*3/uL (ref 0.0–0.1)
Immature Granulocytes: 0 %
LYMPHS ABS: 2.1 10*3/uL (ref 0.7–3.1)
Lymphs: 21 %
MCH: 29.7 pg (ref 26.6–33.0)
MCHC: 33.1 g/dL (ref 31.5–35.7)
MCV: 90 fL (ref 79–97)
MONOCYTES: 5 %
MONOS ABS: 0.5 10*3/uL (ref 0.1–0.9)
NEUTROS ABS: 7.3 10*3/uL — AB (ref 1.4–7.0)
Neutrophils: 73 %
PLATELETS: 319 10*3/uL (ref 150–379)
RBC: 3.94 x10E6/uL (ref 3.77–5.28)
RDW: 14.6 % (ref 12.3–15.4)
WBC: 10 10*3/uL (ref 3.4–10.8)

## 2016-02-25 LAB — IRON AND TIBC
IRON SATURATION: 21 % (ref 15–55)
Iron: 74 ug/dL (ref 27–139)
TIBC: 346 ug/dL (ref 250–450)
UIBC: 272 ug/dL (ref 118–369)

## 2016-02-25 LAB — COMPREHENSIVE METABOLIC PANEL
ALBUMIN: 4.3 g/dL (ref 3.5–4.7)
ALK PHOS: 62 IU/L (ref 39–117)
ALT: 13 IU/L (ref 0–32)
AST: 21 IU/L (ref 0–40)
Albumin/Globulin Ratio: 1.6 (ref 1.2–2.2)
BILIRUBIN TOTAL: 0.4 mg/dL (ref 0.0–1.2)
BUN / CREAT RATIO: 34 — AB (ref 12–28)
BUN: 33 mg/dL — AB (ref 8–27)
CO2: 18 mmol/L (ref 18–29)
CREATININE: 0.97 mg/dL (ref 0.57–1.00)
Calcium: 10.2 mg/dL (ref 8.7–10.3)
Chloride: 107 mmol/L — ABNORMAL HIGH (ref 96–106)
GFR calc Af Amer: 60 mL/min/{1.73_m2} (ref >59)
GFR calc non Af Amer: 52 mL/min/{1.73_m2} — ABNORMAL LOW (ref >59)
GLUCOSE: 94 mg/dL (ref 65–99)
Globulin, Total: 2.7 g/dL (ref 1.5–4.5)
Potassium: 4.6 mmol/L (ref 3.5–5.2)
Sodium: 140 mmol/L (ref 134–144)
Total Protein: 7 g/dL (ref 6.0–8.5)

## 2016-02-25 LAB — LIPID PANEL
CHOLESTEROL TOTAL: 204 mg/dL — AB (ref 100–199)
Chol/HDL Ratio: 3.4 ratio units (ref 0.0–4.4)
HDL: 60 mg/dL (ref >39)
LDL Calculated: 119 mg/dL — ABNORMAL HIGH (ref 0–99)
Triglycerides: 123 mg/dL (ref 0–149)
VLDL CHOLESTEROL CAL: 25 mg/dL (ref 5–40)

## 2016-02-25 LAB — TSH: TSH: 1.74 u[IU]/mL (ref 0.450–4.500)

## 2016-02-27 ENCOUNTER — Ambulatory Visit: Payer: Medicare Other | Admitting: Family Medicine

## 2016-04-01 ENCOUNTER — Other Ambulatory Visit: Payer: Self-pay | Admitting: Family Medicine

## 2016-04-27 ENCOUNTER — Other Ambulatory Visit: Payer: Self-pay | Admitting: Family Medicine

## 2016-06-17 ENCOUNTER — Other Ambulatory Visit: Payer: Self-pay

## 2016-06-17 ENCOUNTER — Encounter: Payer: Self-pay | Admitting: Family Medicine

## 2016-06-17 ENCOUNTER — Other Ambulatory Visit: Payer: Self-pay | Admitting: Family Medicine

## 2016-06-17 ENCOUNTER — Telehealth: Payer: Self-pay | Admitting: Family Medicine

## 2016-06-17 ENCOUNTER — Ambulatory Visit (INDEPENDENT_AMBULATORY_CARE_PROVIDER_SITE_OTHER): Payer: Medicare Other | Admitting: Family Medicine

## 2016-06-17 VITALS — BP 158/62 | HR 73 | Temp 98.4°F | Wt 173.6 lb

## 2016-06-17 DIAGNOSIS — R197 Diarrhea, unspecified: Secondary | ICD-10-CM

## 2016-06-17 DIAGNOSIS — E611 Iron deficiency: Secondary | ICD-10-CM

## 2016-06-17 NOTE — Progress Notes (Unsigned)
lidex

## 2016-06-17 NOTE — Progress Notes (Signed)
Patient: Kerri Ramirez Female    DOB: 04-Dec-1925   83 y.o.   MRN: [id number] Visit Date: 06/17/2016  Today's Provider: Dortha Kernennis Afua Hoots, PA   Chief Complaint  Patient presents with  . Hyperlipidemia  . Follow-up   Subjective:    HPI  Lipid/Cholesterol, Follow-up:   Last seen for this4 months  ago.  Management changes since that visit include continue Fish Oil and recommended to increase water intake. . Last Lipid Panel:    Component Value Date/Time   CHOL 204 (H) 02/24/2016 1227   TRIG 123 02/24/2016 1227   HDL 60 02/24/2016 1227   CHOLHDL 3.4 02/24/2016 1227   LDLCALC 119 (H) 02/24/2016 1227    Risk factors for vascular disease include hypercholesterolemia  She reports good compliance with treatment. She is not having side effects.  Current symptoms include none and chronic diarrhea  Weight trend: stable Current diet: in general, a "healthy" diet   Current exercise: none and walking a little bit  Wt Readings from Last 3 Encounters:  06/17/16 173 lb 9.6 oz (78.7 kg)  02/24/16 173 lb (78.5 kg)  02/24/16 173 lb (78.5 kg)    ------------------------------------------------------------------- Past Medical History:  Diagnosis Date  . Hypertension    Patient Active Problem List   Diagnosis Date Noted  . Ventral hernia without obstruction or gangrene 05/09/2015  . Short-term memory loss 04/28/2015  . Absolute anemia 10/29/2014  . Acute thromboembolism of deep veins of lower extremity (HCC) 10/29/2014  . Acid reflux 10/29/2014  . Borderline diabetes 10/29/2014  . Gout 10/29/2014  . 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 HLD (hyperlipidemia) 10/29/2014  . BP (high blood pressure) 10/29/2014  . Decreased potassium in the blood 10/29/2014  . Iron deficiency 10/29/2014  . Colitis, ischemic (HCC) 10/29/2014  . Elevated WBC count 10/29/2014  . LBP (low back pain) 10/29/2014  . Amnesia 10/29/2014  . Adiposity 10/29/2014  . OP (osteoporosis) 10/29/2014  . Addison  anemia 10/29/2014  . Diarrhea 10/29/2014  . Telogen effluvium 10/29/2014  . Tubular adenoma of colon 11/22/1997   Past Surgical History:  Procedure Laterality Date  . ANKLE SURGERY Left 1981  . BACK SURGERY  1987   broken vertebra  . COLONOSCOPY     Dr Mechele CollinElliott  . HEMICOLECTOMY Right 07-25-13   Dr Byrd/diverticulitis  . IVC FILTER PLACEMENT ([facility] HX)  09/01/2013   Family History  Problem Relation Age of Onset  . Heart attack Father   . Heart attack Brother   . Heart attack Brother    Allergies  Allergen Reactions  . Codeine Other (See Comments)    Made head spin      Previous Medications   ALENDRONATE (FOSAMAX) 70 MG TABLET    TAKE ONE (1) TABLET BY MOUTH ONCE A WEEKON AN EMPTY STOMACH WITH A FULL GLASS OFWATER   ASCORBIC ACID (VITAMIN C IMMUNE HEALTH) 500 MG CHEW    Chew by mouth daily.   CYANOCOBALAMIN 1000 MCG TABLET    Take by mouth.   FERREX 150 150 MG CAPSULE    TAKE ONE (1) CAPSULE BY MOUTH 2 TIMES DAILY   FERROUS SULFATE 325 (65 FE) MG TABLET    Take 325 mg by mouth 2 (two) times daily with a meal.   MULTIPLE VITAMINS-MINERALS (CENTRUM SILVER 50+WOMEN) TABS    Take by mouth.   OMEGA-3 FATTY ACIDS (FISH OIL) 1000 MG CAPS    Take by mouth.   PROBIOTIC CAPS    Take by mouth.  Review of Systems  Constitutional: Negative.   Respiratory: Negative.   Cardiovascular: Negative.   Gastrointestinal: Positive for diarrhea.    Social History  Substance Use Topics  . Smoking status: Former Smoker    Years: 2.00    Types: Cigarettes    Quit date: 02/16/1944  . Smokeless tobacco: Never Used  . Alcohol use No   Objective:   BP (!) 158/62 (BP Location: Right Arm, Patient Position: Sitting, Cuff Size: Normal)   Pulse 73   Temp 98.4 F (36.9 C) (Oral)   Wt 173 lb 9.6 oz (78.7 kg)   SpO2 98%   BMI 29.80 kg/m  BP Readings from Last 3 Encounters:  06/17/16 (!) 158/62  02/24/16 (!) 145/73  02/24/16 (!) 145/73   Wt Readings from Last 3 Encounters:  06/17/16 173 lb  9.6 oz (78.7 kg)  02/24/16 173 lb (78.5 kg)  02/24/16 173 lb (78.5 kg)   Physical Exam  Constitutional: She is oriented to person, place, and time. She appears well-developed and well-nourished. No distress.  HENT:  Head: Normocephalic and atraumatic.  Right Ear: Hearing normal.  Left Ear: Hearing normal.  Nose: Nose normal.  Eyes: Conjunctivae and lids are normal. Right eye exhibits no discharge. Left eye exhibits no discharge. No scleral icterus.  Neck: Neck supple.  Cardiovascular: Normal rate and regular rhythm.   Pulmonary/Chest: Effort normal and breath sounds normal. No respiratory distress.  Abdominal: Soft. Bowel sounds are normal. She exhibits no mass. There is no tenderness.  Musculoskeletal: Normal range of motion.  Neurological: She is alert and oriented to person, place, and time.  Skin: Skin is intact. No lesion and no rash noted.  Psychiatric: She has a normal mood and affect. Her speech is normal and behavior is normal. Thought content normal.      Assessment & Plan:     1. Diarrhea, unspecified type Has had frequent diarrhea since having a right hemicolectomy in 2015 due to diverticulitis. Has noticed diarrhea happens every week for the 3-4 days after taking Fosamax. May hold this medication and recheck CBC with CMP. May use Pepto-Bismol prn as it usually helps with control of diarrhea. Encouraged to continue Probiotic supplement. If no better off the Fosamax, will need referral to a gastroenterologist. - CBC with Differential/Platelet - Comprehensive metabolic panel  2. Iron deficiency Still taking Ferrex and Ferrous sulfate (both BID). Will recheck CBC and iron levels. Tests on 02-24-16 showed hgb 11.7 with iron of 74 and saturation 21%. Probably should come off some of the iron, at least. Recheck pending lab reports. - CBC with Differential/Platelet - Iron - Iron and TIBC

## 2016-06-17 NOTE — Telephone Encounter (Signed)
Patient's daughter Gavin PoundDeborah advised of assessment and plan of OV today.

## 2016-06-17 NOTE — Telephone Encounter (Signed)
Pt 's daughter called wanting to know if you can tell her how her mom's appt. went today with Maurine Ministerennis.  Her call back is [phone]  Thank steri

## 2016-06-18 ENCOUNTER — Telehealth: Payer: Self-pay

## 2016-06-18 LAB — COMPREHENSIVE METABOLIC PANEL
ALT: 12 IU/L (ref 0–32)
AST: 19 IU/L (ref 0–40)
Albumin/Globulin Ratio: 1.9 (ref 1.2–2.2)
Albumin: 4.1 g/dL (ref 3.5–4.7)
Alkaline Phosphatase: 58 IU/L (ref 39–117)
BUN/Creatinine Ratio: 26 (ref 12–28)
BUN: 25 mg/dL (ref 8–27)
Bilirubin Total: 0.4 mg/dL (ref 0.0–1.2)
CALCIUM: 10.1 mg/dL (ref 8.7–10.3)
CO2: 18 mmol/L (ref 18–29)
CREATININE: 0.96 mg/dL (ref 0.57–1.00)
Chloride: 108 mmol/L — ABNORMAL HIGH (ref 96–106)
GFR calc Af Amer: 61 mL/min/{1.73_m2} (ref >59)
GFR, EST NON AFRICAN AMERICAN: 53 mL/min/{1.73_m2} — AB (ref >59)
GLUCOSE: 90 mg/dL (ref 65–99)
Globulin, Total: 2.2 g/dL (ref 1.5–4.5)
POTASSIUM: 4.5 mmol/L (ref 3.5–5.2)
Sodium: 141 mmol/L (ref 134–144)
Total Protein: 6.3 g/dL (ref 6.0–8.5)

## 2016-06-18 LAB — CBC WITH DIFFERENTIAL/PLATELET
BASOS: 0 %
Basophils Absolute: 0 10*3/uL (ref 0.0–0.2)
EOS (ABSOLUTE): 0.1 10*3/uL (ref 0.0–0.4)
EOS: 1 %
HEMATOCRIT: 35.1 % (ref 34.0–46.6)
Hemoglobin: 11.6 g/dL (ref 11.1–15.9)
IMMATURE GRANS (ABS): 0 10*3/uL (ref 0.0–0.1)
IMMATURE GRANULOCYTES: 0 %
LYMPHS: 21 %
Lymphocytes Absolute: 2.1 10*3/uL (ref 0.7–3.1)
MCH: 30.4 pg (ref 26.6–33.0)
MCHC: 33 g/dL (ref 31.5–35.7)
MCV: 92 fL (ref 79–97)
Monocytes Absolute: 0.5 10*3/uL (ref 0.1–0.9)
Monocytes: 5 %
NEUTROS PCT: 73 %
Neutrophils Absolute: 7.5 10*3/uL — ABNORMAL HIGH (ref 1.4–7.0)
Platelets: 302 10*3/uL (ref 150–379)
RBC: 3.82 x10E6/uL (ref 3.77–5.28)
RDW: 14.2 % (ref 12.3–15.4)
WBC: 10.3 10*3/uL (ref 3.4–10.8)

## 2016-06-18 LAB — IRON AND TIBC
IRON SATURATION: 58 % — AB (ref 15–55)
IRON: 183 ug/dL — AB (ref 27–139)
Total Iron Binding Capacity: 314 ug/dL (ref 250–450)
UIBC: 131 ug/dL (ref 118–369)

## 2016-06-18 NOTE — Telephone Encounter (Signed)
-----   Message from Tamsen Roersennis E Chrismon, [place] sent at 06/18/2016  7:54 AM EDT ----- Blood tests essentially normal except iron level above normal ranges. Need to stop iron supplements and follow up levels in 3 months to be sure they don't drop again.

## 2016-06-18 NOTE — Telephone Encounter (Signed)
Patient's daughter Claris CheMargaret advised.

## 2016-09-07 DIAGNOSIS — S62346A Nondisplaced fracture of base of fifth metacarpal bone, right hand, initial encounter for closed fracture: Secondary | ICD-10-CM | POA: Diagnosis not present

## 2016-09-07 DIAGNOSIS — S62309A Unspecified fracture of unspecified metacarpal bone, initial encounter for closed fracture: Secondary | ICD-10-CM | POA: Insufficient documentation

## 2016-09-07 DIAGNOSIS — S93622A Sprain of tarsometatarsal ligament of left foot, initial encounter: Secondary | ICD-10-CM | POA: Diagnosis not present

## 2016-09-17 ENCOUNTER — Ambulatory Visit (INDEPENDENT_AMBULATORY_CARE_PROVIDER_SITE_OTHER): Payer: Medicare Other | Admitting: Family Medicine

## 2016-09-17 ENCOUNTER — Encounter: Payer: Self-pay | Admitting: Family Medicine

## 2016-09-17 VITALS — BP 148/60 | HR 80 | Temp 97.7°F | Resp 16 | Wt 175.0 lb

## 2016-09-17 DIAGNOSIS — E611 Iron deficiency: Secondary | ICD-10-CM

## 2016-09-17 NOTE — Progress Notes (Signed)
Patient: Kerri Ramirez Female    DOB: 1925-05-30   83 y.o.   MRN: [id number] Visit Date: 09/17/2016  Today's Provider: Dortha Kernennis Harwood Nall, PA   Chief Complaint  Patient presents with  . Anemia   Subjective:    HPI Anemia- Pt is here for a 3 month follow up for anemia. She stopped her iron and told to follow up to make sure levels have not dropped again. Pt reports that she is feeling well, other than not being able to get around like she used to.   Patient Active Problem List   Diagnosis Date Noted  . Ventral hernia without obstruction or gangrene 05/09/2015  . Short-term memory loss 04/28/2015  . Absolute anemia 10/29/2014  . Acute thromboembolism of deep veins of lower extremity (HCC) 10/29/2014  . Acid reflux 10/29/2014  . Borderline diabetes 10/29/2014  . Gout 10/29/2014  . 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 HLD (hyperlipidemia) 10/29/2014  . BP (high blood pressure) 10/29/2014  . Decreased potassium in the blood 10/29/2014  . Iron deficiency 10/29/2014  . Colitis, ischemic (HCC) 10/29/2014  . Elevated WBC count 10/29/2014  . LBP (low back pain) 10/29/2014  . Amnesia 10/29/2014  . Adiposity 10/29/2014  . OP (osteoporosis) 10/29/2014  . Addison anemia 10/29/2014  . Diarrhea 10/29/2014  . Telogen effluvium 10/29/2014  . Tubular adenoma of colon 11/22/1997   Past Surgical History:  Procedure Laterality Date  . ANKLE SURGERY Left 1981  . BACK SURGERY  1987   broken vertebra  . COLONOSCOPY     Dr Mechele CollinElliott  . HEMICOLECTOMY Right 07-25-13   Dr Byrd/diverticulitis  . IVC FILTER PLACEMENT ([facility] HX)  09/01/2013   Family History  Problem Relation Age of Onset  . Heart attack Father   . Heart attack Brother   . Heart attack Brother    Allergies  Allergen Reactions  . Codeine Other (See Comments)    Made head spin     Current Outpatient Prescriptions:  .  Ascorbic Acid (VITAMIN C IMMUNE HEALTH) 500 MG CHEW, Chew by mouth daily., Disp: , Rfl:  .   cyanocobalamin 1000 MCG tablet, Take by mouth., Disp: , Rfl:  .  Multiple Vitamins-Minerals (CENTRUM SILVER 50+WOMEN) TABS, Take by mouth., Disp: , Rfl:  .  Omega-3 Fatty Acids (FISH OIL) 1000 MG CAPS, Take by mouth., Disp: , Rfl:  .  alendronate (FOSAMAX) 70 MG tablet, TAKE ONE (1) TABLET BY MOUTH ONCE A WEEKON AN EMPTY STOMACH WITH A FULL GLASS OFWATER (Patient not taking: Reported on 09/17/2016), Disp: 12 tablet, Rfl: 3 .  FERREX 150 150 MG capsule, TAKE ONE (1) CAPSULE BY MOUTH 2 TIMES DAILY (Patient not taking: Reported on 09/17/2016), Disp: 60 capsule, Rfl: 3 .  Probiotic CAPS, Take by mouth., Disp: , Rfl:   Review of Systems  Constitutional: Negative.   HENT: Negative.   Eyes: Negative.   Respiratory: Negative.   Gastrointestinal: Negative.   Endocrine: Negative.   Genitourinary: Negative.   Musculoskeletal: Positive for arthralgias and joint swelling.  Allergic/Immunologic: Negative.   Neurological: Negative.   Hematological: Negative.   Psychiatric/Behavioral: Negative.     Social History  Substance Use Topics  . Smoking status: Former Smoker    Years: 2.00    Types: Cigarettes    Quit date: 02/16/1944  . Smokeless tobacco: Never Used  . Alcohol use No   Objective:   BP (!) 148/60 (BP Location: Right Arm, Patient Position: Sitting, Cuff Size: Large)  Pulse 80   Temp 97.7 F (36.5 C) (Oral)   Resp 16   Wt 175 lb (79.4 kg)   SpO2 94%   BMI 30.04 kg/m  Vitals:   09/17/16 0815  BP: (!) 148/60  Pulse: 80  Resp: 16  Temp: 97.7 F (36.5 C)  TempSrc: Oral  SpO2: 94%  Weight: 175 lb (79.4 kg)     Physical Exam  Constitutional: She is oriented to person, place, and time. She appears well-developed and well-nourished. No distress.  HENT:  Head: Normocephalic and atraumatic.  Right Ear: Hearing normal.  Left Ear: Hearing normal.  Nose: Nose normal.  Eyes: Conjunctivae and lids are normal. Right eye exhibits no discharge. Left eye exhibits no discharge. No scleral  icterus.  Cardiovascular: Normal rate and regular rhythm.   Pulmonary/Chest: Effort normal. No respiratory distress.  Abdominal: Soft. Bowel sounds are normal.  Neurological: She is alert and oriented to person, place, and time.  Skin: Skin is intact. No lesion and no rash noted.  Psychiatric: She has a normal mood and affect. Her speech is normal and behavior is normal. Thought content normal.      Assessment & Plan:     1. Iron deficiency States she feels well and no further constipation since she came off the iron supplement. Eating well and fair balance with support of a cane. Will recheck CBC and ferritin level. - CBC with Differential/Platelet - Ferritin       Dortha Kernennis Mackenzey Crownover, PA  [facility]

## 2016-09-18 LAB — CBC WITH DIFFERENTIAL/PLATELET
BASOS ABS: 0 10*3/uL (ref 0.0–0.2)
Basos: 0 %
EOS (ABSOLUTE): 0.1 10*3/uL (ref 0.0–0.4)
Eos: 1 %
Hematocrit: 34 % (ref 34.0–46.6)
Hemoglobin: 11.1 g/dL (ref 11.1–15.9)
Immature Grans (Abs): 0 10*3/uL (ref 0.0–0.1)
Immature Granulocytes: 0 %
LYMPHS ABS: 1.7 10*3/uL (ref 0.7–3.1)
Lymphs: 23 %
MCH: 29.9 pg (ref 26.6–33.0)
MCHC: 32.6 g/dL (ref 31.5–35.7)
MCV: 92 fL (ref 79–97)
Monocytes Absolute: 0.3 10*3/uL (ref 0.1–0.9)
Monocytes: 4 %
NEUTROS ABS: 5.5 10*3/uL (ref 1.4–7.0)
Neutrophils: 72 %
PLATELETS: 316 10*3/uL (ref 150–379)
RBC: 3.71 x10E6/uL — AB (ref 3.77–5.28)
RDW: 14.6 % (ref 12.3–15.4)
WBC: 7.7 10*3/uL (ref 3.4–10.8)

## 2016-09-18 LAB — FERRITIN: FERRITIN: 23 ng/mL (ref 15–150)

## 2016-09-21 ENCOUNTER — Telehealth: Payer: Self-pay

## 2016-09-21 NOTE — Telephone Encounter (Signed)
Mrs. Stanton KidneyDebra advised as below. sd

## 2016-09-21 NOTE — Telephone Encounter (Signed)
-----   Message from Tamsen Roersennis E Chrismon, [place] sent at 09/18/2016 10:01 AM EDT ----- No anemia now and iron level is in the normal range. Continue your Centrum Multivitamin with Minerals to maintain good blood levels. Shouldn't need the Ferrex (iron supplement) right now. Recheck blood levels in 6 months to be sure it does not fall back down.

## 2016-11-25 ENCOUNTER — Ambulatory Visit: Payer: Medicare Other

## 2016-11-27 ENCOUNTER — Ambulatory Visit: Payer: Medicare Other

## 2016-12-01 ENCOUNTER — Ambulatory Visit (INDEPENDENT_AMBULATORY_CARE_PROVIDER_SITE_OTHER): Payer: Medicare Other

## 2016-12-01 DIAGNOSIS — Z23 Encounter for immunization: Secondary | ICD-10-CM | POA: Diagnosis not present

## 2017-01-17 ENCOUNTER — Encounter: Payer: Self-pay | Admitting: Family Medicine

## 2017-01-17 ENCOUNTER — Ambulatory Visit (INDEPENDENT_AMBULATORY_CARE_PROVIDER_SITE_OTHER): Payer: Medicare Other | Admitting: Family Medicine

## 2017-01-17 VITALS — BP 164/60 | HR 84 | Temp 98.2°F | Resp 16 | Wt 169.0 lb

## 2017-01-17 DIAGNOSIS — D508 Other iron deficiency anemias: Secondary | ICD-10-CM

## 2017-01-17 DIAGNOSIS — G3184 Mild cognitive impairment, so stated: Secondary | ICD-10-CM

## 2017-01-17 LAB — CBC WITH DIFFERENTIAL/PLATELET
BASOS PCT: 0.6 %
Basophils Absolute: 53 cells/uL (ref 0–200)
EOS PCT: 0.9 %
Eosinophils Absolute: 80 cells/uL (ref 15–500)
HEMATOCRIT: 33.7 % — AB (ref 35.0–45.0)
Hemoglobin: 11.4 g/dL — ABNORMAL LOW (ref 11.7–15.5)
LYMPHS ABS: 1896 {cells}/uL (ref 850–3900)
MCH: 30.5 pg (ref 27.0–33.0)
MCHC: 33.8 g/dL (ref 32.0–36.0)
MCV: 90.1 fL (ref 80.0–100.0)
MPV: 10.3 fL (ref 7.5–12.5)
Monocytes Relative: 4.9 %
Neutro Abs: 6435 cells/uL (ref 1500–7800)
Neutrophils Relative %: 72.3 %
PLATELETS: 318 10*3/uL (ref 140–400)
RBC: 3.74 10*6/uL — ABNORMAL LOW (ref 3.80–5.10)
RDW: 12.7 % (ref 11.0–15.0)
Total Lymphocyte: 21.3 %
WBC: 8.9 10*3/uL (ref 3.8–10.8)
WBCMIX: 436 {cells}/uL (ref 200–950)

## 2017-01-17 LAB — IRON,TIBC AND FERRITIN PANEL
%SAT: 23 % (calc) (ref 11–50)
Ferritin: 21 ng/mL (ref 20–288)
Iron: 81 ug/dL (ref 45–160)
TIBC: 350 ug/dL (ref 250–450)

## 2017-01-17 NOTE — Progress Notes (Signed)
Patient: Kerri Ramirez Female    DOB: September 17, 1925   83 y.o.   MRN: [id number] Visit Date: 01/17/2017  Today's Provider: Shirlee LatchAngela Tramya Schoenfelder, MD   Chief Complaint  Patient presents with  . Anemia   Subjective:    HPI    Anemia: Patient presents for presents evaluation of anemia. Anemia was found by routine CBC.  It has been present for a few years.  Associated signs & symptoms: fatigue.  She was found to have iron deficiency anemia after a diverticular bleeding for which she is s/p R hemicolectomy.  She has been off of iron supplementation for ~6 months.  It was causing constipation, but this has resolved.  Pt is concerned about her memory. MMSE was performed today.  States that she forgets sometimes. She will forget what she was going to say from time to time and her husband will have to remind her.  She states he also forgets similarly.  She performs all ADLs independently. Daughter helps with managing finances.  Does not drive and never has. MMSE - Mini Mental State Exam 01/17/2017  Orientation to time 3  Orientation to Place 5  Registration 3  Attention/ Calculation 4  Recall 1  Language- name 2 objects 2  Language- repeat 0  Language- follow 3 step command 2  Language- read & follow direction 1  Write a sentence 1  Copy design 0  Total score 22     Allergies  Allergen Reactions  . Codeine Other (See Comments)    Made head spin      Current Outpatient Medications:  .  Ascorbic Acid (VITAMIN C IMMUNE HEALTH) 500 MG CHEW, Chew by mouth daily., Disp: , Rfl:  .  cyanocobalamin 1000 MCG tablet, Take by mouth., Disp: , Rfl:  .  Multiple Vitamins-Minerals (CENTRUM SILVER 50+WOMEN) TABS, Take by mouth., Disp: , Rfl:  .  Probiotic CAPS, Take by mouth., Disp: , Rfl:  .  FERREX 150 150 MG capsule, TAKE ONE (1) CAPSULE BY MOUTH 2 TIMES DAILY (Patient not taking: Reported on 09/17/2016), Disp: 60 capsule, Rfl: 3 .  Omega-3 Fatty Acids (FISH OIL) 1000 MG CAPS, Take by  mouth., Disp: , Rfl:   Review of Systems  Constitutional: Positive for fatigue. Negative for activity change, appetite change, chills, diaphoresis, fever and unexpected weight change.  Respiratory: Negative for shortness of breath.   Cardiovascular: Negative for chest pain, palpitations and leg swelling.  Psychiatric/Behavioral: Positive for confusion (per pt).    Social History   Tobacco Use  . Smoking status: Former Smoker    Years: 2.00    Types: Cigarettes    Last attempt to quit: 02/16/1944    Years since quitting: 72.9  . Smokeless tobacco: Never Used  Substance Use Topics  . Alcohol use: No   Objective:   BP (!) 164/60 (BP Location: Left Arm, Patient Position: Sitting, Cuff Size: Large)   Pulse 84   Temp 98.2 F (36.8 C) (Oral)   Resp 16   Wt 169 lb (76.7 kg)   BMI 29.01 kg/m  Vitals:   01/17/17 0827  BP: (!) 164/60  Pulse: 84  Resp: 16  Temp: 98.2 F (36.8 C)  TempSrc: Oral  Weight: 169 lb (76.7 kg)     Physical Exam  Constitutional: She is oriented to person, place, and time. She appears well-developed and well-nourished. No distress.  HENT:  Head: Normocephalic and atraumatic.  Eyes: Conjunctivae are normal. No scleral icterus.  Neck:  Neck supple. No thyromegaly present.  Cardiovascular: Normal rate, regular rhythm and intact distal pulses.  Murmur heard. Pulmonary/Chest: Effort normal and breath sounds normal. No respiratory distress. She has no wheezes. She has no rales.  Abdominal: Soft. She exhibits no distension. There is no tenderness. There is no rebound and no guarding.  Musculoskeletal: She exhibits no deformity. Edema: trace edema of b/l ankles.  Lymphadenopathy:    She has no cervical adenopathy.  Neurological: She is alert and oriented to person, place, and time. No cranial nerve deficit.  Skin: Skin is warm and dry. No rash noted.  Psychiatric: She has a normal mood and affect. Her behavior is normal.  Vitals reviewed.      Assessment  & Plan:      Problem List Items Addressed This Visit      Nervous and Auditory   MCI (mild cognitive impairment)    Long standing problem but new diagnosis Mild cognitive impairment evident on MMSE and discussion about memory loss Seems to be functioning well independently at this time Discussed Aricept, but given patient's h/o chronic diarrhea, she wishes to hold off on this at this time        Other   Iron deficiency anemia - Primary    On last check, normal Hgb Not on supplementation at this time Recheck Iron panel, CBC Can spread out lab checks from here forward if this is normal      Relevant Orders   CBC w/Diff/Platelet   Fe+TIBC+Fer      Return in about 3 months (around 04/17/2017).     The entirety of the information documented in the History of Present Illness, Review of Systems and Physical Exam were personally obtained by me. Portions of this information were initially documented by Irving BurtonEmily Ratchford, CMA and reviewed by me for thoroughness and accuracy.     Shirlee LatchAngela Eloyse Causey, MD  [facility]

## 2017-01-17 NOTE — Patient Instructions (Signed)
Mild Neurocognitive Disorder Mild neurocognitive disorder (formerly known as mild cognitive impairment) is a mental disorder. It is a slight abnormal decrease in mental function. The areas of mental function affected may include memory, thought, communication, behavior, and completion of tasks. The decrease is noticeable and measurable but for the most part does not interfere with your daily activities. Mild neurocognitive disorder typically occurs in people older than 60 years but can occur earlier. It is not as serious as major neurocognitive disorder (formerly known as dementia) but may lead to a more serious neurocognitive disorder. However, in some cases the condition does not get worse. A few people with this disorder even improve. What are the causes? There are a number of different causes of mild neurocognitive disorder:  Brain disorders associated with abnormal protein deposits, such as Alzheimer's disease, Pick's disease, and Lewy body disease.  Brain disorders associated with abnormal movement, such as Parkinson's disease and Huntington's disease.  Diseases affecting blood vessels in the brain and resulting in mini-strokes.  Certain infections, such as human immunodeficiency virus (HIV) infection.  Traumatic brain injury.  Other medical conditions such as brain tumors, underactive thyroid (hypothyroidism), and vitamin B12 deficiency.  Use of certain prescription medicine and "recreational" drugs.  What are the signs or symptoms? Symptoms of mild neurocognitive disorder include:  Difficulty remembering. You may forget details of recent events, names, or phone numbers. You may forget important social events and appointments or repeatedly forget where you put your car keys.  Difficulty thinking and solving problems. You may have trouble with complex tasks such as paying bills or driving in unfamiliar locations.  Difficulty communicating. You may have trouble finding the right word,  naming an object, forming a sentence that makes sense, or understanding what you read or hear.  Changes in your behavior or personality. You may lose interest in the things that you used to enjoy or withdraw from social situations. You may get angry more easily than usual. You may act before thinking. You may do things in public that you would not usually do. You may hear or see things that are not real (hallucinations). You may believe falsely that others are trying to hurt you (paranoia).  How is this diagnosed? Mild neurocognitive disorder is diagnosed through an assessment by your health care provider. Your health care provider will ask you and your family, friends, or coworkers questions about your symptoms. He or she will ask how often the symptoms occur, how long they have been occurring, whether they are getting worse, and the effect they are having on your life. Your health care provider may refer you to a neurologist or mental health specialist for a detailed evaluation of your mental functions (neuropsychological testing). To identify the cause of your mild neurocognitive disorder, your health care provider may:  Obtain a detailed medical history.  Ask about alcohol and drug use, including prescription medicine.  Perform a physical exam.  Order blood tests and brain imaging exams.  How is this treated? Mild neurocognitive disorder caused by infections, use of certain medicines or "recreational" drugs, and certain medical conditions may improve with treatment of the condition that is causing the disorder. Mild neurocognitive disorder resulting from other causes generally does not improve and may worsen. In these cases, the goal of treatment is to slow progression of the disorder and help you cope with the loss of mental function. Treatments in these cases include:  Medicine. Medicine helps mainly with memory loss and behavioral symptoms.  Talk therapy.   Talk therapy provides education,  emotional support, memory aids, and other ways of making up for decreases in mental function.  Lifestyle changes. These include regular exercise, a healthy diet (including essential omega-3 fatty acids), intellectual stimulation, and increased social interaction.  This information is not intended to replace advice given to you by your health care provider. Make sure you discuss any questions you have with your health care provider. Document Released: 10/04/2012 Document Revised: 07/10/2015 Document Reviewed: 06/26/2012 Elsevier Interactive Patient Education  2017 [employer]  

## 2017-01-17 NOTE — Assessment & Plan Note (Addendum)
Long standing problem but new diagnosis Mild cognitive impairment evident on MMSE and discussion about memory loss Seems to be functioning well independently at this time Discussed Aricept, but given patient's h/o chronic diarrhea, she wishes to hold off on this at this time

## 2017-01-17 NOTE — Assessment & Plan Note (Signed)
On last check, normal Hgb Not on supplementation at this time Recheck Iron panel, CBC Can spread out lab checks from here forward if this is normal

## 2017-01-18 ENCOUNTER — Telehealth: Payer: Self-pay

## 2017-01-18 NOTE — Telephone Encounter (Signed)
[email or web address]

## 2017-01-18 NOTE — Telephone Encounter (Signed)
Daughter advised of results

## 2017-01-18 NOTE — Telephone Encounter (Signed)
-----   Message from Angela M Bacigalupo, MD sent at 01/18/2017  9:09 Erasmo DownerAM EST ----- Hemoglobin is stable over the last year.  Iron studies are normal.  Bacigalupo, Marzella SchleinAngela M, MD, MPH [facility] 01/18/2017 9:09 AM

## 2017-02-24 ENCOUNTER — Ambulatory Visit (INDEPENDENT_AMBULATORY_CARE_PROVIDER_SITE_OTHER): Payer: Medicare Other

## 2017-02-24 VITALS — BP 160/60 | HR 88 | Temp 98.0°F | Ht 64.0 in | Wt 174.4 lb

## 2017-02-24 DIAGNOSIS — Z0000 Encounter for general adult medical examination without abnormal findings: Secondary | ICD-10-CM | POA: Diagnosis not present

## 2017-02-24 NOTE — Patient Instructions (Addendum)
[name] , Thank you for taking time to come for your Medicare Wellness Visit. I appreciate your ongoing commitment to your health goals. Please review the following plan we discussed and let me know if I can assist you in the future.   Screening recommendations/referrals: Colonoscopy: Up to date Mammogram: Up to date Bone Density: Up to date Recommended yearly ophthalmology/optometry visit for glaucoma screening and checkup Recommended yearly dental visit for hygiene and checkup  Vaccinations: Influenza vaccine: Up to date Pneumococcal vaccine: Pt declines today.  Tdap vaccine: Pt declines today.  Shingles vaccine: Pt declines today.     Advanced directives: Please bring a copy of your POA (Power of Attorney) and/or Living Will to your next appointment.   Conditions/risks identified: Obesity- Recommend to continue drinking 6-8 glasses of water a day.  Next appointment: 04/18/17   Preventive Care 65 Years and Older, Female Preventive care refers to lifestyle choices and visits with your health care provider that can promote health and wellness. What does preventive care include?  A yearly physical exam. This is also called an annual well check.  Dental exams once or twice a year.  Routine eye exams. Ask your health care provider how often you should have your eyes checked.  Personal lifestyle choices, including:  Daily care of your teeth and gums.  Regular physical activity.  Eating a healthy diet.  Avoiding tobacco and drug use.  Limiting alcohol use.  Practicing safe sex.  Taking low-dose aspirin every day.  Taking vitamin and mineral supplements as recommended by your health care provider. What happens during an annual well check? The services and screenings done by your health care provider during your annual well check will depend on your age, overall health, lifestyle risk factors, and family history of disease. Counseling  Your health care provider may  ask you questions about your:  Alcohol use.  Tobacco use.  Drug use.  Emotional well-being.  Home and relationship well-being.  Sexual activity.  Eating habits.  History of falls.  Memory and ability to understand (cognition).  Work and work Astronomerenvironment.  Reproductive health. Screening  You may have the following tests or measurements:  Height, weight, and BMI.  Blood pressure.  Lipid and cholesterol levels. These may be checked every 5 years, or more frequently if you are over 82 years old.  Skin check.  Lung cancer screening. You may have this screening every year starting at age 82 if you have a 30-pack-year history of smoking and currently smoke or have quit within the past 15 years.  Fecal occult blood test (FOBT) of the stool. You may have this test every year starting at age 82.  Flexible sigmoidoscopy or colonoscopy. You may have a sigmoidoscopy every 5 years or a colonoscopy every 10 years starting at age 82.  Hepatitis C blood test.  Hepatitis B blood test.  Sexually transmitted disease (STD) testing.  Diabetes screening. This is done by checking your blood sugar (glucose) after you have not eaten for a while (fasting). You may have this done every 1-3 years.  Bone density scan. This is done to screen for osteoporosis. You may have this done starting at age 82.  Mammogram. This may be done every 1-2 years. Talk to your health care provider about how often you should have regular mammograms. Talk with your health care provider about your test results, treatment options, and if necessary, the need for more tests. Vaccines  Your health care provider may recommend certain vaccines, such  as:  Influenza vaccine. This is recommended every year.  Tetanus, diphtheria, and acellular pertussis (Tdap, Td) vaccine. You may need a Td booster every 10 years.  Zoster vaccine. You may need this after age 14.  Pneumococcal 13-valent conjugate (PCV13) vaccine. One  dose is recommended after age 49.  Pneumococcal polysaccharide (PPSV23) vaccine. One dose is recommended after age 49. Talk to your health care provider about which screenings and vaccines you need and how often you need them. This information is not intended to replace advice given to you by your health care provider. Make sure you discuss any questions you have with your health care provider. Document Released: 02/28/2015 Document Revised: 10/22/2015 Document Reviewed: 12/03/2014 Elsevier Interactive Patient Education  2017 [place] Prevention in the Home Falls can cause injuries. They can happen to people of all ages. There are many things you can do to make your home safe and to help prevent falls. What can I do on the outside of my home?  Regularly fix the edges of walkways and driveways and fix any cracks.  Remove anything that might make you trip as you walk through a door, such as a raised step or threshold.  Trim any bushes or trees on the path to your home.  Use bright outdoor lighting.  Clear any walking paths of anything that might make someone trip, such as rocks or tools.  Regularly check to see if handrails are loose or broken. Make sure that both sides of any steps have handrails.  Any raised decks and porches should have guardrails on the edges.  Have any leaves, snow, or ice cleared regularly.  Use sand or salt on walking paths during winter.  Clean up any spills in your garage right away. This includes oil or grease spills. What can I do in the bathroom?  Use night lights.  Install grab bars by the toilet and in the tub and shower. Do not use towel bars as grab bars.  Use non-skid mats or decals in the tub or shower.  If you need to sit down in the shower, use a plastic, non-slip stool.  Keep the floor dry. Clean up any water that spills on the floor as soon as it happens.  Remove soap buildup in the tub or shower regularly.  Attach bath  mats securely with double-sided non-slip rug tape.  Do not have throw rugs and other things on the floor that can make you trip. What can I do in the bedroom?  Use night lights.  Make sure that you have a light by your bed that is easy to reach.  Do not use any sheets or blankets that are too big for your bed. They should not hang down onto the floor.  Have a firm chair that has side arms. You can use this for support while you get dressed.  Do not have throw rugs and other things on the floor that can make you trip. What can I do in the kitchen?  Clean up any spills right away.  Avoid walking on wet floors.  Keep items that you use a lot in easy-to-reach places.  If you need to reach something above you, use a strong step stool that has a grab bar.  Keep electrical cords out of the way.  Do not use floor polish or wax that makes floors slippery. If you must use wax, use non-skid floor wax.  Do not have throw rugs and other things on the  floor that can make you trip. What can I do with my stairs?  Do not leave any items on the stairs.  Make sure that there are handrails on both sides of the stairs and use them. Fix handrails that are broken or loose. Make sure that handrails are as long as the stairways.  Check any carpeting to make sure that it is firmly attached to the stairs. Fix any carpet that is loose or worn.  Avoid having throw rugs at the top or bottom of the stairs. If you do have throw rugs, attach them to the floor with carpet tape.  Make sure that you have a light switch at the top of the stairs and the bottom of the stairs. If you do not have them, ask someone to add them for you. What else can I do to help prevent falls?  Wear shoes that:  Do not have high heels.  Have rubber bottoms.  Are comfortable and fit you well.  Are closed at the toe. Do not wear sandals.  If you use a stepladder:  Make sure that it is fully opened. Do not climb a closed  stepladder.  Make sure that both sides of the stepladder are locked into place.  Ask someone to hold it for you, if possible.  Clearly mark and make sure that you can see:  Any grab bars or handrails.  First and last steps.  Where the edge of each step is.  Use tools that help you move around (mobility aids) if they are needed. These include:  Canes.  Walkers.  Scooters.  Crutches.  Turn on the lights when you go into a dark area. Replace any light bulbs as soon as they burn out.  Set up your furniture so you have a clear path. Avoid moving your furniture around.  If any of your floors are uneven, fix them.  If there are any pets around you, be aware of where they are.  Review your medicines with your doctor. Some medicines can make you feel dizzy. This can increase your chance of falling. Ask your doctor what other things that you can do to help prevent falls. This information is not intended to replace advice given to you by your health care provider. Make sure you discuss any questions you have with your health care provider. Document Released: 11/28/2008 Document Revised: 07/10/2015 Document Reviewed: 03/08/2014 Elsevier Interactive Patient Education  2017 [employer].

## 2017-02-24 NOTE — Progress Notes (Signed)
Subjective:   Kerri Ramirez is a 84 y.o. female who presents for Medicare Annual (Subsequent) preventive examination.  Review of Systems:  N/A  Cardiac Risk Factors include: advanced age (>14men, >71 women);dyslipidemia;hypertension;obesity (BMI >30kg/m2)     Objective:     Vitals: BP (!) 160/60 (BP Location: Left Arm)   Pulse 88   Temp 98 F (36.7 C) (Oral)   Ht 5\' 4"  (1.626 m)   Wt 174 lb 6.4 oz (79.1 kg)   BMI 29.94 kg/m   Body mass index is 29.94 kg/m.  Advanced Directives 02/24/2017 02/24/2016 10/29/2014  Does Patient Have a Medical Advance Directive? Yes Yes Yes  Type of Estate agent of [place];Living will Healthcare Power of [place];Living will Healthcare Power of [place];Living will  Copy of Healthcare Power of Attorney in Chart? No - copy requested Yes -    Tobacco Social History   Tobacco Use  Smoking Status Former Smoker  . Years: 2.00  . Types: Cigarettes  . Last attempt to quit: 02/16/1944  . Years since quitting: 73.0  Smokeless Tobacco Never Used     Counseling given: Not Answered   Clinical Intake:  Pre-visit preparation completed: Yes  Pain : No/denies pain Pain Score: 0-No pain     Nutritional Status: BMI 25 -29 Overweight Nutritional Risks: None Diabetes: No  How often do you need to have someone help you when you read instructions, pamphlets, or other written materials from your doctor or pharmacy?: 1 - Never  Interpreter Needed?: No  Information entered by :: [facility] LPN  Past Medical History:  Diagnosis Date  . Hypertension    Past Surgical History:  Procedure Laterality Date  . ANKLE SURGERY Left 1981  . BACK SURGERY  1987   broken vertebra  . COLONOSCOPY     Dr Mechele Collin  . HEMICOLECTOMY Right 07-25-13   Dr Byrd/diverticulitis  . IVC FILTER PLACEMENT ([facility] HX)  09/01/2013   Family History  Problem Relation Age of Onset  . Heart attack Father   . Heart attack Brother   . Heart attack  Brother    Social History   Socioeconomic History  . Marital status: Married    Spouse name: None  . Number of children: 3  . Years of education: None  . Highest education level: 6th grade  Social Needs  . Financial resource strain: Not hard at all  . Food insecurity - worry: Never true  . Food insecurity - inability: Never true  . Transportation needs - medical: No  . Transportation needs - non-medical: No  Occupational History  . Occupation: retired  Tobacco Use  . Smoking status: Former Smoker    Years: 2.00    Types: Cigarettes    Last attempt to quit: 02/16/1944    Years since quitting: 73.0  . Smokeless tobacco: Never Used  Substance and Sexual Activity  . Alcohol use: No  . Drug use: No  . Sexual activity: None  Other Topics Concern  . None  Social History Narrative   1 son deceased- suicide    Outpatient Encounter Medications as of 02/24/2017  Medication Sig  . Ascorbic Acid (VITAMIN C IMMUNE HEALTH) 500 MG CHEW Chew by mouth daily.  . cyanocobalamin 1000 MCG tablet Take 500 mcg by mouth daily.   . Multiple Vitamins-Minerals (CENTRUM SILVER 50+WOMEN) TABS Take by mouth.  . Omega-3 Fatty Acids (FISH OIL) 1000 MG CAPS Take 1,000 mg by mouth daily.   . Probiotic CAPS Take by mouth  daily.   . [DISCONTINUED] FERREX 150 150 MG capsule TAKE ONE (1) CAPSULE BY MOUTH 2 TIMES DAILY (Patient not taking: Reported on 09/17/2016)   No facility-administered encounter medications on file as of 02/24/2017.     Activities of Daily Living In your present state of health, do you have any difficulty performing the following activities: 02/24/2017  Hearing? N  Vision? N  Difficulty concentrating or making decisions? Y  Walking or climbing stairs? N  Dressing or bathing? N  Doing errands, shopping? N  Preparing Food and eating ? N  Using the Toilet? N  In the past six months, have you accidently leaked urine? N  Do you have problems with loss of bowel control? N  Managing your  Medications? N  Managing your Finances? Y  Housekeeping or managing your Housekeeping? Y  Some recent data might be hidden    Patient Care Team: Erasmo DownerBacigalupo, Angela M, MD as PCP - General (Family Medicine) Lemar LivingsByrnett, Merrily PewJeffrey W, MD (General Surgery)    Assessment:   This is a routine wellness examination [facility].  Exercise Activities and Dietary recommendations Current Exercise Habits: The patient does not participate in regular exercise at present, Exercise limited by: None identified  Goals    . DIET - INCREASE WATER INTAKE     Recommend to continue drinking 6-8 glasses of water a day. Pt declined changing diet or exercising.        Fall Risk Fall Risk  02/24/2017 02/24/2016 10/29/2014  Falls in the past year? No No No   Is the patient's home free of loose throw rugs in walkways, pet beds, electrical cords, etc?   yes      Grab bars in the bathroom? yes      Handrails on the stairs?   yes      Adequate lighting?   yes  Timed Get Up and Go performed: N/A  Depression Screen PHQ 2/9 Scores 02/24/2017 02/24/2016 10/29/2014  PHQ - 2 Score 0 0 0     Cognitive Function MMSE - Mini Mental State Exam 01/17/2017  Orientation to time 3  Orientation to Place 5  Registration 3  Attention/ Calculation 4  Recall 1  Language- name 2 objects 2  Language- repeat 0  Language- follow 3 step command 2  Language- read & follow direction 1  Write a sentence 1  Copy design 0  Total score 22     6CIT Screen 02/24/2016  What Year? 0 points  What month? 0 points  What time? 0 points  Count back from 20 2 points  Months in reverse 0 points  Repeat phrase 6 points  Total Score 8    Immunization History  Administered Date(s) Administered  . Influenza Split 11/10/2011  . Influenza, High Dose Seasonal PF 12/03/2013, 10/29/2014, 11/28/2015, 12/01/2016  . Influenza,inj,Quad PF,6+ Mos 12/06/2012  . Influenza-Unspecified 11/10/2011, 12/06/2012, 12/03/2013, 10/29/2014  . Pneumococcal  Conjugate-13 12/03/2013    Qualifies for Shingles Vaccine? Pt declines today.   Screening Tests Health Maintenance  Topic Date Due  . TETANUS/TDAP  07/08/1944  . PNA vac Low Risk Adult (2 of 2 - PPSV23) 12/04/2014  . INFLUENZA VACCINE  Completed  . DEXA SCAN  Completed    Cancer Screenings: Lung: Low Dose CT Chest recommended if Age 67-80 years, 30 pack-year currently smoking OR have quit w/in 15years. Patient does not qualify. Breast:  Up to date on Mammogram? Yes   Up to date of Bone Density/Dexa? Yes Colorectal: Up to date  Additional Screenings:  Hepatitis B/HIV/Syphillis: Pt declines today.  Hepatitis C Screening: Pt declines today.      Plan:  I have personally reviewed and addressed the Medicare Annual Wellness questionnaire and have noted the following in the patient's chart:  A. Medical and social history B. Use of alcohol, tobacco or illicit drugs  C. Current medications and supplements D. Functional ability and status E.  Nutritional status F.  Physical activity G. Advance directives H. List of other physicians I.  Hospitalizations, surgeries, and ER visits in previous 12 months J.  Vitals K. Screenings such as hearing and vision if needed, cognitive and depression L. Referrals and appointments - none  In addition, I have reviewed and discussed with patient certain preventive protocols, quality metrics, and best practice recommendations. A written personalized care plan for preventive services as well as general preventive health recommendations were provided to patient.  See attached scanned questionnaire for additional information.   Signed,  Hyacinth Meeker, LPN Nurse Health Advisor   Nurse Recommendations: Pt declined the Pneumovax 23 and the tetanus vaccine today. B/p was high on both attempts. Requested pt to take her BP at home twice daily for one week and report back if she has elevated BP readings. FYI!

## 2017-04-18 ENCOUNTER — Ambulatory Visit (INDEPENDENT_AMBULATORY_CARE_PROVIDER_SITE_OTHER): Payer: Medicare Other | Admitting: Family Medicine

## 2017-04-18 ENCOUNTER — Encounter: Payer: Self-pay | Admitting: Family Medicine

## 2017-04-18 VITALS — BP 154/70 | HR 84 | Temp 97.8°F | Resp 16 | Wt 173.0 lb

## 2017-04-18 DIAGNOSIS — I10 Essential (primary) hypertension: Secondary | ICD-10-CM

## 2017-04-18 DIAGNOSIS — D508 Other iron deficiency anemias: Secondary | ICD-10-CM

## 2017-04-18 DIAGNOSIS — E538 Deficiency of other specified B group vitamins: Secondary | ICD-10-CM | POA: Diagnosis not present

## 2017-04-18 DIAGNOSIS — Z114 Encounter for screening for human immunodeficiency virus [HIV]: Secondary | ICD-10-CM

## 2017-04-18 DIAGNOSIS — G3184 Mild cognitive impairment, so stated: Secondary | ICD-10-CM | POA: Diagnosis not present

## 2017-04-18 MED ORDER — DONEPEZIL HCL 5 MG PO TABS: 5.0000 mg | ORAL_TABLET | Freq: Every day | ORAL | 3 refills | Status: DC

## 2017-04-18 NOTE — Assessment & Plan Note (Signed)
Remains elevated today and has been for last several visits Patient is hesitant to take a medication for this and believes it is well controlled at home though she does not remember any readings As we are starting Aricept today, we will hold off on starting an antihypertensive F/u in 6 wks and consider starting amlodipine

## 2017-04-18 NOTE — Assessment & Plan Note (Signed)
Hemoglobin stable on last check and normal iron panel

## 2017-04-18 NOTE — Progress Notes (Signed)
Patient: Kerri Ramirez Female    DOB: [birth year]   84 y.o.   MRN: [id number] Visit Date: 04/18/2017  Today's Provider: Shirlee Latch, MD   I, Joslyn Hy, CMA, am acting as scribe for Shirlee Latch, MD.  Chief Complaint  Patient presents with  . Anemia  . Memory Loss   Subjective:    HPI     Follow up for Iron Deficiency  The patient was last seen for this 3 months ago. Changes made at last visit include checking labs; pt's hgb has been stable over the last year.   Lab Results  Component Value Date   WBC 8.9 01/17/2017   HGB 11.4 (L) 01/17/2017   HCT 33.7 (L) 01/17/2017   MCV 90.1 01/17/2017   PLT 318 01/17/2017     ------------------------------------------------------------------------------------   Follow up for MCI  The patient was last seen for this 3 months ago. Changes made at last visit include checking MMSE, which was 22.  Pt did not want to start Aricept due to her history of chronic diarrhea.  Today she states she does not remember why she is here, but memory is her only medical problem. ------------------------------------------------------------------------------------  HTN: - Medications: none - Compliance: n/a - Checking BP at home: yes, states it is good but does not remember what the readings have been - Denies any SOB, CP, vision changes, LE edema, medication SEs, or symptoms of hypotension    Allergies  Allergen Reactions  . Codeine Other (See Comments)    Made head spin      Current Outpatient Medications:  .  Ascorbic Acid (VITAMIN C IMMUNE HEALTH) 500 MG CHEW, Chew by mouth daily., Disp: , Rfl:  .  cyanocobalamin 1000 MCG tablet, Take 500 mcg by mouth daily. , Disp: , Rfl:  .  Multiple Vitamins-Minerals (CENTRUM SILVER 50+WOMEN) TABS, Take by mouth., Disp: , Rfl:  .  Omega-3 Fatty Acids (FISH OIL) 1000 MG CAPS, Take 1,000 mg by mouth daily. , Disp: , Rfl:  .  Probiotic CAPS, Take by mouth daily. , Disp: ,  Rfl:  .  donepezil (ARICEPT) 5 MG tablet, Take 1 tablet (5 mg total) by mouth at bedtime., Disp: 30 tablet, Rfl: 3  Review of Systems  Constitutional: Negative for activity change, appetite change, chills, diaphoresis, fatigue, fever and unexpected weight change.  Respiratory: Negative for shortness of breath.   Cardiovascular: Positive for leg swelling (left ankle s/p fracture). Negative for chest pain and palpitations.  Gastrointestinal: Negative for constipation and diarrhea.    Social History   Tobacco Use  . Smoking status: Former Smoker    Years: 2.00    Types: Cigarettes    Last attempt to quit: 02/16/1944    Years since quitting: 73.2  . Smokeless tobacco: Never Used  Substance Use Topics  . Alcohol use: No   Objective:   BP (!) 154/70 (BP Location: Left Arm, Patient Position: Sitting, Cuff Size: Large)   Pulse 84   Temp 97.8 F (36.6 C) (Oral)   Resp 16   Wt 173 lb (78.5 kg)   SpO2 98%   BMI 29.70 kg/m  Vitals:   04/18/17 0821  BP: (!) 154/70  Pulse: 84  Resp: 16  Temp: 97.8 F (36.6 C)  TempSrc: Oral  SpO2: 98%  Weight: 173 lb (78.5 kg)     Physical Exam  Constitutional: She appears well-developed and well-nourished. No distress.  HENT:  Head: Normocephalic and atraumatic.  Eyes: Conjunctivae are  normal. No scleral icterus.  Neck: Neck supple. No thyromegaly present.  Cardiovascular: Normal rate, regular rhythm and intact distal pulses.  Murmur heard. Pulmonary/Chest: Effort normal and breath sounds normal. No respiratory distress. She has no wheezes. She has no rales.  Musculoskeletal: She exhibits no edema.  Lymphadenopathy:    She has no cervical adenopathy.  Neurological: She is alert.  Skin: Skin is warm and dry. No rash noted.  Psychiatric: She has a normal mood and affect. Her behavior is normal.  Vitals reviewed.      Assessment & Plan:      Problem List Items Addressed This Visit      Cardiovascular and Mediastinum   BP (high blood  pressure)    Remains elevated today and has been for last several visits Patient is hesitant to take a medication for this and believes it is well controlled at home though she does not remember any readings As we are starting Aricept today, we will hold off on starting an antihypertensive F/u in 6 wks and consider starting amlodipine         Nervous and Auditory   MCI (mild cognitive impairment) - Primary    Chronic, stable Seems to be functioning well and independent Lives with her husband Patient agrees to try Aricept Discussed potential side effects Start at 5mg  daily F/u in 6 wks, if tolerating well, can increase dose to 10mg  daily Check B12, RPR, HIV      Relevant Orders   B12   RPR   HIV antibody (with reflex)     Other   Iron deficiency anemia    Hemoglobin stable on last check and normal iron panel       Other Visit Diagnoses    B12 deficiency       Relevant Orders   B12   Screening for HIV (human immunodeficiency virus)       Relevant Orders   HIV antibody (with reflex)       Return in about 6 weeks (around 05/30/2017) for memory, BP, pre-diabetes, HLD f/u.   The entirety of the information documented in the History of Present Illness, Review of Systems and Physical Exam were personally obtained by me. Portions of this information were initially documented by Irving BurtonEmily Ratchford, CMA and reviewed by me for thoroughness and accuracy.    Erasmo DownerBacigalupo, Stephano Arrants M, MD, MPH [facility] 04/18/2017 9:13 AM

## 2017-04-18 NOTE — Patient Instructions (Signed)
Mild Neurocognitive Disorder Mild neurocognitive disorder (formerly known as mild cognitive impairment) is a mental disorder. It is a slight abnormal decrease in mental function. The areas of mental function affected may include memory, thought, communication, behavior, and completion of tasks. The decrease is noticeable and measurable but for the most part does not interfere with your daily activities. Mild neurocognitive disorder typically occurs in people older than 60 years but can occur earlier. It is not as serious as major neurocognitive disorder (formerly known as dementia) but may lead to a more serious neurocognitive disorder. However, in some cases the condition does not get worse. A few people with this disorder even improve. What are the causes? There are a number of different causes of mild neurocognitive disorder:  Brain disorders associated with abnormal protein deposits, such as Alzheimer's disease, Pick's disease, and Lewy body disease.  Brain disorders associated with abnormal movement, such as Parkinson's disease and Huntington's disease.  Diseases affecting blood vessels in the brain and resulting in mini-strokes.  Certain infections, such as human immunodeficiency virus (HIV) infection.  Traumatic brain injury.  Other medical conditions such as brain tumors, underactive thyroid (hypothyroidism), and vitamin B12 deficiency.  Use of certain prescription medicine and "recreational" drugs.  What are the signs or symptoms? Symptoms of mild neurocognitive disorder include:  Difficulty remembering. You may forget details of recent events, names, or phone numbers. You may forget important social events and appointments or repeatedly forget where you put your car keys.  Difficulty thinking and solving problems. You may have trouble with complex tasks such as paying bills or driving in unfamiliar locations.  Difficulty communicating. You may have trouble finding the right word,  naming an object, forming a sentence that makes sense, or understanding what you read or hear.  Changes in your behavior or personality. You may lose interest in the things that you used to enjoy or withdraw from social situations. You may get angry more easily than usual. You may act before thinking. You may do things in public that you would not usually do. You may hear or see things that are not real (hallucinations). You may believe falsely that others are trying to hurt you (paranoia).  How is this diagnosed? Mild neurocognitive disorder is diagnosed through an assessment by your health care provider. Your health care provider will ask you and your family, friends, or coworkers questions about your symptoms. He or she will ask how often the symptoms occur, how long they have been occurring, whether they are getting worse, and the effect they are having on your life. Your health care provider may refer you to a neurologist or mental health specialist for a detailed evaluation of your mental functions (neuropsychological testing). To identify the cause of your mild neurocognitive disorder, your health care provider may:  Obtain a detailed medical history.  Ask about alcohol and drug use, including prescription medicine.  Perform a physical exam.  Order blood tests and brain imaging exams.  How is this treated? Mild neurocognitive disorder caused by infections, use of certain medicines or "recreational" drugs, and certain medical conditions may improve with treatment of the condition that is causing the disorder. Mild neurocognitive disorder resulting from other causes generally does not improve and may worsen. In these cases, the goal of treatment is to slow progression of the disorder and help you cope with the loss of mental function. Treatments in these cases include:  Medicine. Medicine helps mainly with memory loss and behavioral symptoms.  Talk therapy.   Talk therapy provides education,  emotional support, memory aids, and other ways of making up for decreases in mental function.  Lifestyle changes. These include regular exercise, a healthy diet (including essential omega-3 fatty acids), intellectual stimulation, and increased social interaction.  This information is not intended to replace advice given to you by your health care provider. Make sure you discuss any questions you have with your health care provider. Document Released: 10/04/2012 Document Revised: 07/10/2015 Document Reviewed: 06/26/2012 Elsevier Interactive Patient Education  2017 [employer]  

## 2017-04-18 NOTE — Assessment & Plan Note (Addendum)
Chronic, stable Seems to be functioning well and independent Lives with her husband Patient agrees to try Aricept Discussed potential side effects Start at 5mg  daily F/u in 6 wks, if tolerating well, can increase dose to 10mg  daily Check B12, RPR, HIV

## 2017-04-19 LAB — VITAMIN B12: Vitamin B-12: 567 pg/mL (ref 232–1245)

## 2017-04-19 LAB — HIV ANTIBODY (ROUTINE TESTING W REFLEX): HIV Screen 4th Generation wRfx: NONREACTIVE

## 2017-04-19 LAB — RPR: RPR Ser Ql: NONREACTIVE

## 2017-04-21 ENCOUNTER — Telehealth: Payer: Self-pay

## 2017-04-21 NOTE — Telephone Encounter (Signed)
Daughter advised of lab results.

## 2017-04-21 NOTE — Telephone Encounter (Signed)
-----   Message from Erasmo DownerAngela M Bacigalupo, MD sent at 04/21/2017 12:07 PM EST ----- Dementia labs were all normal.  No change to treatment.  Erasmo DownerBacigalupo, Angela M, MD, MPH [facility] 04/21/2017 12:07 PM

## 2017-04-25 ENCOUNTER — Telehealth: Payer: Self-pay | Admitting: Family Medicine

## 2017-04-25 NOTE — Telephone Encounter (Signed)
Patient's POA Toribio HarbourDeborah Mann wants to discuss Aricept.

## 2017-04-25 NOTE — Telephone Encounter (Signed)
Dr. B prescribed Arricept

## 2017-04-26 NOTE — Telephone Encounter (Signed)
Gavin PoundDeborah wanted to inform PCP that pt had SE of Aricept (diarrhea) and has D/C this medication. Daughter was concerned because pt informed her that Dr. B asked her to "find out the ingredients" of this medication. Daughter is unsure of this was really requested, or is this pt's dementia? I asked daughter to come in with mother at her next OV, but daughter states pt may not come in for appointment since she D/C the medication? Can we cancel appointment?

## 2017-04-27 NOTE — Telephone Encounter (Signed)
Patients daughter has been advised, she states that she will be at patients next appointment. Appointment moved to Monday 05/30/17. KW

## 2017-04-27 NOTE — Telephone Encounter (Signed)
It is fine that she discontinued this medication.  I did not ask her to find out the ingredients.  The 6 wk f/u visit was supposed to be also for BP, pre-diabetes, and cholesterol.  I recommend her keeping the appt and having someone come with her to appts.  Erasmo DownerBacigalupo, Angela M, MD, MPH [facility] 04/27/2017 8:35 AM

## 2017-05-23 ENCOUNTER — Ambulatory Visit: Payer: Self-pay | Admitting: Family Medicine

## 2017-05-30 ENCOUNTER — Encounter: Payer: Self-pay | Admitting: Family Medicine

## 2017-05-30 ENCOUNTER — Ambulatory Visit (INDEPENDENT_AMBULATORY_CARE_PROVIDER_SITE_OTHER): Payer: Medicare Other | Admitting: Family Medicine

## 2017-05-30 VITALS — BP 164/72 | HR 96 | Temp 98.6°F | Resp 16 | Wt 172.0 lb

## 2017-05-30 DIAGNOSIS — E785 Hyperlipidemia, unspecified: Secondary | ICD-10-CM | POA: Diagnosis not present

## 2017-05-30 DIAGNOSIS — G3184 Mild cognitive impairment, so stated: Secondary | ICD-10-CM | POA: Diagnosis not present

## 2017-05-30 DIAGNOSIS — I10 Essential (primary) hypertension: Secondary | ICD-10-CM | POA: Diagnosis not present

## 2017-05-30 DIAGNOSIS — R7303 Prediabetes: Secondary | ICD-10-CM | POA: Diagnosis not present

## 2017-05-30 MED ORDER — LISINOPRIL 10 MG PO TABS: 10.0000 mg | ORAL_TABLET | Freq: Every day | ORAL | 3 refills | Status: DC

## 2017-05-30 MED ORDER — MEMANTINE HCL 5 MG PO TABS: 5.0000 mg | ORAL_TABLET | Freq: Every day | ORAL | 1 refills | Status: DC

## 2017-05-30 NOTE — Progress Notes (Signed)
Patient: Kerri Ramirez Female    DOB: 12/23/1925   84 y.o.   MRN: [id number] Visit Date: 05/30/2017  Today's Provider: Shirlee Latch, MD   I, Joslyn Hy, CMA, am acting as scribe for Shirlee Latch, MD.  Chief Complaint  Patient presents with  . Hypertension  . Hyperlipidemia  . Hyperglycemia  . Memory Loss   Subjective:    HPI      Hypertension, follow-up:  BP Readings from Last 3 Encounters:  05/30/17 (!) 164/72  04/18/17 (!) 154/70  02/24/17 (!) 160/60    She was last seen for hypertension 6 weeks ago.  BP at that visit was 154/70. Management since that visit includes none; was going to hold off on antihypertensive until FU since pt started Aricept at LOV. She is not exercising. She is adherent to low salt diet.   She is experiencing fatigue.  Patient denies chest pain, chest pressure/discomfort, claudication, dyspnea, exertional chest pressure/discomfort, irregular heart beat, lower extremity edema, near-syncope, orthopnea, palpitations and syncope.   Cardiovascular risk factors include advanced age (older than 73 for men, 66 for women), dyslipidemia and hypertension.    Weight trend: stable Wt Readings from Last 3 Encounters:  05/30/17 172 lb (78 kg)  04/18/17 173 lb (78.5 kg)  02/24/17 174 lb 6.4 oz (79.1 kg)    Current diet: "sometimes" healthy  ------------------------------------------------------------------------   Hyperglycemia, Follow-up:   Lab Results  Component Value Date   HGBA1C 5.6 10/29/2014   GLUCOSE 90 06/17/2016   GLUCOSE 94 02/24/2016   GLUCOSE 101 (H) 11/28/2015    Last seen for for this over 1 year ago.  Management since then includes none. Current symptoms include none and have been stable.   Lipid/Cholesterol, Follow-up:   Last seen for this over 1 year ago.  Management changes since that visit include none. . Last Lipid Panel:    Component Value Date/Time   CHOL 204 (H) 02/24/2016 1227   TRIG 123 02/24/2016 1227   HDL 60 02/24/2016 1227   CHOLHDL 3.4 02/24/2016 1227   LDLCALC 119 (H) 02/24/2016 1227    Risk factors for vascular disease include hypercholesterolemia, hypertension and prediabetes   -------------------------------------------------------------------  Follow up for MCI  The patient was last seen for this 6 weeks ago. Changes made at last visit include starting Aricept.  This was discontinued due to diarrhea.  ------------------------------------------------------------------------------------    Allergies  Allergen Reactions  . Codeine Other (See Comments)    Made head spin      Current Outpatient Medications:  .  Ascorbic Acid (VITAMIN C IMMUNE HEALTH) 500 MG CHEW, Chew by mouth daily., Disp: , Rfl:  .  cyanocobalamin 1000 MCG tablet, Take 500 mcg by mouth daily. , Disp: , Rfl:  .  Multiple Vitamins-Minerals (CENTRUM SILVER 50+WOMEN) TABS, Take by mouth., Disp: , Rfl:  .  Omega-3 Fatty Acids (FISH OIL) 1000 MG CAPS, Take 1,000 mg by mouth daily. , Disp: , Rfl:  .  Probiotic CAPS, Take by mouth daily. , Disp: , Rfl:   Review of Systems  Constitutional: Positive for fatigue. Negative for activity change, appetite change, chills, diaphoresis, fever and unexpected weight change.  HENT: Negative.   Eyes: Negative for visual disturbance.  Respiratory: Negative for shortness of breath.   Cardiovascular: Negative for chest pain, palpitations and leg swelling.  Gastrointestinal: Positive for diarrhea. Negative for abdominal pain, nausea and vomiting.  Endocrine: Negative for polydipsia, polyphagia and polyuria.  Musculoskeletal: Negative.   Psychiatric/Behavioral:  Negative.     Social History   Tobacco Use  . Smoking status: Former Smoker    Years: 2.00    Types: Cigarettes    Last attempt to quit: 02/16/1944    Years since quitting: 73.3  . Smokeless tobacco: Never Used  Substance Use Topics  . Alcohol use: No   Objective:   BP (!)  164/72 (BP Location: Left Arm, Patient Position: Sitting, Cuff Size: Normal)   Pulse 96   Temp 98.6 F (37 C) (Oral)   Resp 16   Wt 172 lb (78 kg)   SpO2 99%   BMI 29.52 kg/m  Vitals:   05/30/17 0949  BP: (!) 164/72  Pulse: 96  Resp: 16  Temp: 98.6 F (37 C)  TempSrc: Oral  SpO2: 99%  Weight: 172 lb (78 kg)     Physical Exam  Constitutional: She is oriented to person, place, and time. She appears well-developed and well-nourished. No distress.  HENT:  Head: Normocephalic and atraumatic.  Eyes: Pupils are equal, round, and reactive to light. Conjunctivae are normal. No scleral icterus.  Neck: Neck supple. No thyromegaly present.  Cardiovascular: Normal rate, regular rhythm and intact distal pulses.  Murmur heard. Pulmonary/Chest: Effort normal and breath sounds normal. No respiratory distress. She has no wheezes. She has no rales.  Abdominal: Soft. She exhibits no distension. There is no tenderness.  Musculoskeletal: She exhibits no edema.  Lymphadenopathy:    She has no cervical adenopathy.  Neurological: She is alert and oriented to person, place, and time.  Skin: Skin is warm and dry. Capillary refill takes less than 2 seconds. No rash noted.  Psychiatric: She has a normal mood and affect. Her behavior is normal.  Vitals reviewed.     Assessment & Plan:   Problem List Items Addressed This Visit      Cardiovascular and Mediastinum   Essential hypertension    Uncontrolled - has been elevated for several visits Patient was previously treated with lisinopril without issue Will start lisinopril 10mg  daily Goal BP <150/90, but need to avoid hypotension in elderly patient F/u in 3 months      Relevant Medications   lisinopril (PRINIVIL,ZESTRIL) 10 MG tablet     Nervous and Auditory   MCI (mild cognitive impairment) - Primary    Chronic, stable Functioning well and independent Lives with husband Did not tolerate aricept due to diarrhea Will try low dose  namenda - if not tolerating will d/c        Other   Prediabetes    Not currently on any medications Recheck A1c      Relevant Orders   Hemoglobin A1c   HLD (hyperlipidemia)    Not on any medications Recheck lipid panel Given age, would not consider statin for primary prevention      Relevant Medications   lisinopril (PRINIVIL,ZESTRIL) 10 MG tablet   Other Relevant Orders   Lipid panel   Comprehensive metabolic panel       Return in about 3 months (around 08/29/2017) for chronic disesae f/u.   The entirety of the information documented in the History of Present Illness, Review of Systems and Physical Exam were personally obtained by me. Portions of this information were initially documented by Irving BurtonEmily Ratchford, CMA and reviewed by me for thoroughness and accuracy.    Erasmo DownerBacigalupo, Angela M, MD, MPH [facility] 05/30/2017 10:49 AM

## 2017-05-30 NOTE — Assessment & Plan Note (Signed)
Not currently on any medications Recheck A1c

## 2017-05-30 NOTE — Assessment & Plan Note (Signed)
Uncontrolled - has been elevated for several visits Patient was previously treated with lisinopril without issue Will start lisinopril 10mg  daily Goal BP <150/90, but need to avoid hypotension in elderly patient F/u in 3 months

## 2017-05-30 NOTE — Assessment & Plan Note (Signed)
Chronic, stable Functioning well and independent Lives with husband Did not tolerate aricept due to diarrhea Will try low dose namenda - if not tolerating will d/c

## 2017-05-30 NOTE — Patient Instructions (Addendum)
Blood pressure goal:  120-150/70-90 Start checking a few times weekly after 3-4 weeks on medication  Mild Neurocognitive Disorder Mild neurocognitive disorder (formerly known as mild cognitive impairment) is a mental disorder. It is a slight abnormal decrease in mental function. The areas of mental function affected may include memory, thought, communication, behavior, and completion of tasks. The decrease is noticeable and measurable but for the most part does not interfere with your daily activities. Mild neurocognitive disorder typically occurs in people older than 60 years but can occur earlier. It is not as serious as major neurocognitive disorder (formerly known as dementia) but may lead to a more serious neurocognitive disorder. However, in some cases the condition does not get worse. A few people with this disorder even improve. What are the causes? There are a number of different causes of mild neurocognitive disorder:  Brain disorders associated with abnormal protein deposits, such as Alzheimer's disease, Pick's disease, and Lewy body disease.  Brain disorders associated with abnormal movement, such as Parkinson's disease and Huntington's disease.  Diseases affecting blood vessels in the brain and resulting in mini-strokes.  Certain infections, such as human immunodeficiency virus (HIV) infection.  Traumatic brain injury.  Other medical conditions such as brain tumors, underactive thyroid (hypothyroidism), and vitamin B12 deficiency.  Use of certain prescription medicine and "recreational" drugs.  What are the signs or symptoms? Symptoms of mild neurocognitive disorder include:  Difficulty remembering. You may forget details of recent events, names, or phone numbers. You may forget important social events and appointments or repeatedly forget where you put your car keys.  Difficulty thinking and solving problems. You may have trouble with complex tasks such as paying bills or  driving in unfamiliar locations.  Difficulty communicating. You may have trouble finding the right word, naming an object, forming a sentence that makes sense, or understanding what you read or hear.  Changes in your behavior or personality. You may lose interest in the things that you used to enjoy or withdraw from social situations. You may get angry more easily than usual. You may act before thinking. You may do things in public that you would not usually do. You may hear or see things that are not real (hallucinations). You may believe falsely that others are trying to hurt you (paranoia).  How is this diagnosed? Mild neurocognitive disorder is diagnosed through an assessment by your health care provider. Your health care provider will ask you and your family, friends, or coworkers questions about your symptoms. He or she will ask how often the symptoms occur, how long they have been occurring, whether they are getting worse, and the effect they are having on your life. Your health care provider may refer you to a neurologist or mental health specialist for a detailed evaluation of your mental functions (neuropsychological testing). To identify the cause of your mild neurocognitive disorder, your health care provider may:  Obtain a detailed medical history.  Ask about alcohol and drug use, including prescription medicine.  Perform a physical exam.  Order blood tests and brain imaging exams.  How is this treated? Mild neurocognitive disorder caused by infections, use of certain medicines or "recreational" drugs, and certain medical conditions may improve with treatment of the condition that is causing the disorder. Mild neurocognitive disorder resulting from other causes generally does not improve and may worsen. In these cases, the goal of treatment is to slow progression of the disorder and help you cope with the loss of mental function. Treatments in  these cases include:  Medicine.  Medicine helps mainly with memory loss and behavioral symptoms.  Talk therapy. Talk therapy provides education, emotional support, memory aids, and other ways of making up for decreases in mental function.  Lifestyle changes. These include regular exercise, a healthy diet (including essential omega-3 fatty acids), intellectual stimulation, and increased social interaction.  This information is not intended to replace advice given to you by your health care provider. Make sure you discuss any questions you have with your health care provider. Document Released: 10/04/2012 Document Revised: 07/10/2015 Document Reviewed: 06/26/2012 Elsevier Interactive Patient Education  2017 [employer].

## 2017-05-30 NOTE — Assessment & Plan Note (Signed)
Not on any medications Recheck lipid panel Given age, would not consider statin for primary prevention

## 2017-05-31 ENCOUNTER — Ambulatory Visit: Payer: Self-pay | Admitting: Family Medicine

## 2017-05-31 ENCOUNTER — Telehealth: Payer: Self-pay

## 2017-05-31 LAB — HEMOGLOBIN A1C
ESTIMATED AVERAGE GLUCOSE: 100 mg/dL
Hgb A1c MFr Bld: 5.1 % (ref 4.8–5.6)

## 2017-05-31 LAB — COMPREHENSIVE METABOLIC PANEL
ALT: 11 IU/L (ref 0–32)
AST: 14 IU/L (ref 0–40)
Albumin/Globulin Ratio: 2 (ref 1.2–2.2)
Albumin: 4.3 g/dL (ref 3.5–4.7)
Alkaline Phosphatase: 63 IU/L (ref 39–117)
BUN/Creatinine Ratio: 27 (ref 12–28)
BUN: 28 mg/dL — ABNORMAL HIGH (ref 8–27)
Bilirubin Total: 0.4 mg/dL (ref 0.0–1.2)
CALCIUM: 9.4 mg/dL (ref 8.7–10.3)
CO2: 16 mmol/L — AB (ref 20–29)
CREATININE: 1.02 mg/dL — AB (ref 0.57–1.00)
Chloride: 111 mmol/L — ABNORMAL HIGH (ref 96–106)
GFR calc Af Amer: 56 mL/min/{1.73_m2} — ABNORMAL LOW (ref >59)
GFR, EST NON AFRICAN AMERICAN: 49 mL/min/{1.73_m2} — AB (ref >59)
GLOBULIN, TOTAL: 2.2 g/dL (ref 1.5–4.5)
Glucose: 90 mg/dL (ref 65–99)
Potassium: 4.4 mmol/L (ref 3.5–5.2)
SODIUM: 142 mmol/L (ref 134–144)
Total Protein: 6.5 g/dL (ref 6.0–8.5)

## 2017-05-31 LAB — LIPID PANEL
CHOLESTEROL TOTAL: 189 mg/dL (ref 100–199)
Chol/HDL Ratio: 3.5 ratio (ref 0.0–4.4)
HDL: 54 mg/dL (ref >39)
LDL CALC: 108 mg/dL — AB (ref 0–99)
Triglycerides: 136 mg/dL (ref 0–149)
VLDL Cholesterol Cal: 27 mg/dL (ref 5–40)

## 2017-05-31 NOTE — Telephone Encounter (Signed)
Pt's daughter advised.  

## 2017-05-31 NOTE — Telephone Encounter (Signed)
-----   Message from Erasmo DownerAngela M Bacigalupo, MD sent at 05/31/2017  8:13 AM EDT ----- Normal cholesterol, blood sugar /Hgb A1c, electrolytes, liver function. Stable kidney function.  Erasmo DownerBacigalupo, Angela M, MD, MPH [facility] 05/31/2017 8:13 AM

## 2017-08-02 ENCOUNTER — Other Ambulatory Visit: Payer: Self-pay | Admitting: Family Medicine

## 2017-08-02 MED ORDER — MEMANTINE HCL 5 MG PO TABS: 5.0000 mg | ORAL_TABLET | Freq: Every day | ORAL | 11 refills | Status: DC

## 2017-08-02 NOTE — Telephone Encounter (Signed)
Husband came into office this am requesting to get the following medication refilled for his wife also [facility] [address]  Per husband Pt is completley out of this medication and has been out of this for 2 weeks now. Thanks CC  memantine (NAMENDA) 5 MG tablet

## 2017-08-02 NOTE — Telephone Encounter (Signed)
Mr. Kerri Ramirez would like this refilled ASAP.

## 2017-08-29 ENCOUNTER — Encounter: Payer: Self-pay | Admitting: Family Medicine

## 2017-08-29 ENCOUNTER — Ambulatory Visit (INDEPENDENT_AMBULATORY_CARE_PROVIDER_SITE_OTHER): Payer: Medicare Other | Admitting: Family Medicine

## 2017-08-29 VITALS — BP 184/80 | HR 76 | Temp 97.8°F | Resp 16 | Wt 166.0 lb

## 2017-08-29 DIAGNOSIS — I10 Essential (primary) hypertension: Secondary | ICD-10-CM

## 2017-08-29 DIAGNOSIS — G3184 Mild cognitive impairment, so stated: Secondary | ICD-10-CM

## 2017-08-29 MED ORDER — MEMANTINE HCL 5 MG PO TABS: 5.0000 mg | ORAL_TABLET | Freq: Two times a day (BID) | ORAL | 5 refills | Status: DC

## 2017-08-29 MED ORDER — LISINOPRIL 20 MG PO TABS: 20.0000 mg | ORAL_TABLET | Freq: Every day | ORAL | 5 refills | Status: DC

## 2017-08-29 NOTE — Progress Notes (Signed)
Patient: Kerri Ramirez Female    DOB: 03/31/1925   84 y.o.   MRN: [id number] Visit Date: 08/29/2017  Today's Provider: Shirlee LatchAngela Bacigalupo, MD   I, Joslyn HyEmily Ratchford, CMA, am acting as scribe for Shirlee LatchAngela Bacigalupo, MD.  Chief Complaint  Patient presents with  . Hypertension  . MCI   Subjective:    HPI      Hypertension, follow-up:  BP Readings from Last 3 Encounters:  08/29/17 (!) 184/80  05/30/17 (!) 164/72  04/18/17 (!) 154/70    She was last seen for hypertension 3 months ago.  BP at that visit was 164/72. Management since that visit includes starting lisinopril 10 mg. She reports good compliance with treatment. She is not having side effects.  She is not exercising. She is adherent to low salt diet.   Outside blood pressures are not being checked. She is experiencing fatigue and lower extremity edema.  Patient denies chest pain, chest pressure/discomfort, claudication, dyspnea, exertional chest pressure/discomfort, irregular heart beat, near-syncope, orthopnea, palpitations and syncope.   Cardiovascular risk factors include advanced age (older than 4155 for men, 8865 for women), dyslipidemia and hypertension.  Use of agents associated with hypertension: none.     Weight trend: decreasing steadily Wt Readings from Last 3 Encounters:  08/29/17 166 lb (75.3 kg)  05/30/17 172 lb (78 kg)  04/18/17 173 lb (78.5 kg)    Current diet: states she eats out a lot  ------------------------------------------------------------------------  Follow up for MCI  The patient was last seen for this 3 months ago. Changes made at last visit include starting namenda 5 mg, as aricept caused diarrhea.  She reports good compliance with treatment. She is not having side effects.   ------------------------------------------------------------------------------------    Allergies  Allergen Reactions  . Codeine Other (See Comments)    Made head spin      Current Outpatient  Medications:  .  Ascorbic Acid (VITAMIN C IMMUNE HEALTH) 500 MG CHEW, Chew by mouth daily., Disp: , Rfl:  .  lisinopril (PRINIVIL,ZESTRIL) 10 MG tablet, Take 1 tablet (10 mg total) by mouth daily., Disp: 30 tablet, Rfl: 3 .  memantine (NAMENDA) 5 MG tablet, Take 1 tablet (5 mg total) by mouth at bedtime., Disp: 30 tablet, Rfl: 11 .  Omega-3 Fatty Acids (FISH OIL) 1000 MG CAPS, Take 1,000 mg by mouth daily. , Disp: , Rfl:  .  Probiotic CAPS, Take by mouth daily. , Disp: , Rfl:  .  cyanocobalamin 1000 MCG tablet, Take 500 mcg by mouth daily. , Disp: , Rfl:  .  Multiple Vitamins-Minerals (CENTRUM SILVER 50+WOMEN) TABS, Take by mouth., Disp: , Rfl:   Review of Systems  Constitutional: Positive for fatigue. Negative for activity change, appetite change, chills, diaphoresis, fever and unexpected weight change.  Respiratory: Negative for shortness of breath.   Cardiovascular: Positive for leg swelling. Negative for chest pain and palpitations.    Social History   Tobacco Use  . Smoking status: Former Smoker    Years: 2.00    Types: Cigarettes    Last attempt to quit: 02/16/1944    Years since quitting: 73.5  . Smokeless tobacco: Never Used  Substance Use Topics  . Alcohol use: No   Objective:   BP (!) 184/80 (BP Location: Left Arm, Patient Position: Sitting, Cuff Size: Normal)   Pulse 76   Temp 97.8 F (36.6 C) (Oral)   Resp 16   Wt 166 lb (75.3 kg)   SpO2 96%   BMI 28.49  kg/m  Vitals:   08/29/17 0846  BP: (!) 184/80  Pulse: 76  Resp: 16  Temp: 97.8 F (36.6 C)  TempSrc: Oral  SpO2: 96%  Weight: 166 lb (75.3 kg)     Physical Exam  Constitutional: She is oriented to person, place, and time. She appears well-developed and well-nourished. No distress.  HENT:  Head: Normocephalic and atraumatic.  Eyes: Conjunctivae are normal. No scleral icterus.  Neck: Neck supple. No thyromegaly present.  Cardiovascular: Normal rate, regular rhythm and intact distal pulses.  Murmur  heard. Pulmonary/Chest: Effort normal and breath sounds normal. No respiratory distress. She has no wheezes. She has no rales.  Musculoskeletal: She exhibits no edema.  Lymphadenopathy:    She has no cervical adenopathy.  Neurological: She is alert and oriented to person, place, and time.  Skin: Skin is warm and dry. Capillary refill takes less than 2 seconds. No rash noted.  Psychiatric: She has a normal mood and affect. Her behavior is normal.  Vitals reviewed.      Assessment & Plan:   Problem List Items Addressed This Visit      Cardiovascular and Mediastinum   Essential hypertension - Primary    Uncontrolled Goal BP <150/90, avoid hypotension as she is elderly Increase lisinopril to 20mg  daily Recheck BMP F/u in 1 month      Relevant Medications   lisinopril (PRINIVIL,ZESTRIL) 20 MG tablet   Other Relevant Orders   Basic Metabolic Panel (BMET)     Nervous and Auditory   MCI (mild cognitive impairment)    Chronic, stable Function well and independent Living with husband Did not tolerate aricept 2/2 diarrhea Tolerating Namenda, so will increase to 5mg  BID          Return in about 1 month (around 09/26/2017) for BP f/u.   The entirety of the information documented in the History of Present Illness, Review of Systems and Physical Exam were personally obtained by me. Portions of this information were initially documented by Irving Burton Ratchford, CMA and reviewed by me for thoroughness and accuracy.    Erasmo Downer, MD, MPH [facility] 08/29/2017 9:28 AM

## 2017-08-29 NOTE — Assessment & Plan Note (Signed)
Chronic, stable Function well and independent Living with husband Did not tolerate aricept 2/2 diarrhea Tolerating Namenda, so will increase to 5mg  BID

## 2017-08-29 NOTE — Patient Instructions (Signed)
Increase lisinopril (Blood pressure medicine) to 20mg  daily (one pill of the new prescription)  Increase Namenda (memory medicine) to 5mg  twice daily (same dose just now 2 times daily instead of once daily)

## 2017-08-29 NOTE — Assessment & Plan Note (Signed)
Uncontrolled Goal BP <150/90, avoid hypotension as she is elderly Increase lisinopril to 20mg  daily Recheck BMP F/u in 1 month

## 2017-08-30 ENCOUNTER — Telehealth: Payer: Self-pay | Admitting: Family Medicine

## 2017-08-30 LAB — BASIC METABOLIC PANEL
BUN / CREAT RATIO: 26 (ref 12–28)
BUN: 26 mg/dL (ref 10–36)
CALCIUM: 9.7 mg/dL (ref 8.7–10.3)
CO2: 13 mmol/L — ABNORMAL LOW (ref 20–29)
CREATININE: 1 mg/dL (ref 0.57–1.00)
Chloride: 117 mmol/L (ref 96–106)
GFR calc non Af Amer: 50 mL/min/{1.73_m2} — ABNORMAL LOW (ref >59)
GFR, EST AFRICAN AMERICAN: 57 mL/min/{1.73_m2} — AB (ref >59)
Glucose: 92 mg/dL (ref 65–99)
Potassium: 4.5 mmol/L (ref 3.5–5.2)
Sodium: 146 mmol/L — ABNORMAL HIGH (ref 134–144)

## 2017-08-30 NOTE — Telephone Encounter (Signed)
-----   Message from Erasmo DownerAngela M Bacigalupo, MD sent at 08/30/2017  9:46 AM EDT ----- Stable kidney function.  Sodium and chloride are high.  Make sure to stay well hydrated as this could represent dehydration.  Erasmo DownerBacigalupo, Angela M, MD, MPH [facility] 08/30/2017 9:46 AM

## 2017-08-30 NOTE — Telephone Encounter (Signed)
Daughter advised of labs results and med changes.

## 2017-08-30 NOTE — Telephone Encounter (Signed)
Pt's daughter Gavin PoundDeborah returned call and request call back. Please advise. Thanks TNP

## 2017-08-30 NOTE — Telephone Encounter (Signed)
Pt daughter called wanting to know about her mom's appt yesterday.  How it went and why her mom has another appt fu next month,  Her call back is [phone]  Thanks teri

## 2017-08-30 NOTE — Telephone Encounter (Signed)
Gavin PoundDeborah is on DPR. LMTCB.

## 2017-09-19 ENCOUNTER — Telehealth: Payer: Self-pay

## 2017-09-19 ENCOUNTER — Encounter: Payer: Self-pay | Admitting: Family Medicine

## 2017-09-19 NOTE — Telephone Encounter (Signed)
Patient's daughter is calling requesting that Dr. B write a note stating that the patient is not able to walk to her mailbox.  She reports that the patient's mail box is more than 100 yards from their house, and the post office needs a note saying that they can move their mailbox closer to their home. Please call the daughter when this is ready. Thanks!

## 2017-09-19 NOTE — Telephone Encounter (Signed)
Letter is ready to pick up.  Kerri Ramirez, Kerri Lauderbaugh M, MD, MPH [facility] 09/19/2017 2:44 PM

## 2017-09-19 NOTE — Telephone Encounter (Signed)
Please review

## 2017-09-19 NOTE — Telephone Encounter (Signed)
Daughter advised.

## 2017-09-29 ENCOUNTER — Ambulatory Visit (INDEPENDENT_AMBULATORY_CARE_PROVIDER_SITE_OTHER): Payer: Medicare Other | Admitting: Family Medicine

## 2017-09-29 ENCOUNTER — Encounter: Payer: Self-pay | Admitting: Family Medicine

## 2017-09-29 VITALS — BP 158/60 | HR 80 | Temp 98.2°F | Resp 20 | Wt 164.0 lb

## 2017-09-29 DIAGNOSIS — I10 Essential (primary) hypertension: Secondary | ICD-10-CM

## 2017-09-29 MED ORDER — LISINOPRIL 40 MG PO TABS: 40.0000 mg | ORAL_TABLET | Freq: Every day | ORAL | 2 refills | Status: DC

## 2017-09-29 NOTE — Progress Notes (Signed)
Patient: Kerri Ramirez Female    DOB: 01-25-1926   84 y.o.   MRN: [id number] Visit Date: 09/29/2017  Today's Provider: Shirlee LatchAngela Alvenia Treese, MD   I, Joslyn HyEmily Ratchford, CMA, am acting as scribe for Shirlee LatchAngela Hiilei Gerst, MD.  Chief Complaint  Patient presents with  . Hypertension   Subjective:    HPI      Hypertension, follow-up:  BP Readings from Last 3 Encounters:  09/29/17 (!) 158/60  08/29/17 (!) 184/80  05/30/17 (!) 164/72    She was last seen for hypertension 4 weeks ago.  BP at that visit was 184/80. Management since that visit includes increasing lisinopril to 20 mg po qd. She reports excellent compliance with treatment. She is not having side effects.  She is exercising. Walking. She is adherent to low salt diet.   Outside blood pressures are being checked by daughter, but pt is unsure of readings. She is experiencing none.  Patient denies chest pain, chest pressure/discomfort, claudication, dyspnea, exertional chest pressure/discomfort, fatigue, irregular heart beat, lower extremity edema, near-syncope, orthopnea, palpitations and syncope.   Cardiovascular risk factors include advanced age (older than 955 for men, 5165 for women) and hypertension.  Use of agents associated with hypertension: none.     Weight trend: decreasing steadily Wt Readings from Last 3 Encounters:  09/29/17 164 lb (74.4 kg)  08/29/17 166 lb (75.3 kg)  05/30/17 172 lb (78 kg)    Current diet: in general, a "healthy" diet    ------------------------------------------------------------------------   Allergies  Allergen Reactions  . Codeine Other (See Comments)    Made head spin      Current Outpatient Medications:  .  lisinopril (PRINIVIL,ZESTRIL) 20 MG tablet, Take 1 tablet (20 mg total) by mouth daily., Disp: 30 tablet, Rfl: 5 .  memantine (NAMENDA) 5 MG tablet, Take 1 tablet (5 mg total) by mouth 2 (two) times daily., Disp: 60 tablet, Rfl: 5 .  Omega-3 Fatty Acids (FISH  OIL) 1000 MG CAPS, Take 1,000 mg by mouth daily. , Disp: , Rfl:   Review of Systems  Constitutional: Negative for activity change, appetite change, chills, diaphoresis, fatigue, fever and unexpected weight change.  Respiratory: Negative for shortness of breath.   Cardiovascular: Negative for chest pain, palpitations and leg swelling.  Musculoskeletal: Positive for gait problem.    Social History   Tobacco Use  . Smoking status: Former Smoker    Years: 2.00    Types: Cigarettes    Last attempt to quit: 02/16/1944    Years since quitting: 73.6  . Smokeless tobacco: Never Used  Substance Use Topics  . Alcohol use: No   Objective:   BP (!) 158/60 (BP Location: Left Arm, Patient Position: Sitting, Cuff Size: Normal)   Pulse 80   Temp 98.2 F (36.8 C) (Oral)   Resp 20   Wt 164 lb (74.4 kg)   BMI 28.15 kg/m  Vitals:   09/29/17 0833  BP: (!) 158/60  Pulse: 80  Resp: 20  Temp: 98.2 F (36.8 C)  TempSrc: Oral  Weight: 164 lb (74.4 kg)     Physical Exam  Constitutional: She is oriented to person, place, and time. She appears well-developed and well-nourished. No distress.  HENT:  Head: Normocephalic and atraumatic.  Eyes: Conjunctivae are normal. No scleral icterus.  Cardiovascular: Normal rate, regular rhythm and intact distal pulses.  Murmur heard. Pulmonary/Chest: Effort normal and breath sounds normal. No respiratory distress. She has no wheezes. She has no rales.  Musculoskeletal: She exhibits edema (trace bilateral).  Neurological: She is alert and oriented to person, place, and time.  Skin: Skin is warm and dry. Capillary refill takes less than 2 seconds. No rash noted.  Psychiatric: She has a normal mood and affect. Her behavior is normal.  Vitals reviewed.       Assessment & Plan:   Problem List Items Addressed This Visit      Cardiovascular and Mediastinum   Essential hypertension - Primary    Improving, but still not to goal Goal blood pressures less  than 150/90, avoid hypotension given her advanced age Increase lisinopril to 40 mg daily Reviewed recent labs Follow-up in 1 month      Relevant Medications   lisinopril (PRINIVIL,ZESTRIL) 40 MG tablet       Return in about 4 weeks (around 10/27/2017) for BP f/u.   The entirety of the information documented in the History of Present Illness, Review of Systems and Physical Exam were personally obtained by me. Portions of this information were initially documented by Irving BurtonEmily Ratchford, CMA and reviewed by me for thoroughness and accuracy.    Erasmo DownerBacigalupo, Asmara Backs M, MD, MPH [facility] 09/29/2017 10:00 AM

## 2017-09-29 NOTE — Patient Instructions (Addendum)
Increase lisinopril to 40mg  daily (OK to take 2 of the 20mg  pills together until you run out - then it will be one of the new 40mg  pills daily)

## 2017-09-29 NOTE — Assessment & Plan Note (Signed)
Improving, but still not to goal Goal blood pressures less than 150/90, avoid hypotension given her advanced age Increase lisinopril to 40 mg daily Reviewed recent labs Follow-up in 1 month

## 2017-10-05 ENCOUNTER — Other Ambulatory Visit: Payer: Self-pay | Admitting: Family Medicine

## 2017-10-05 MED ORDER — MEMANTINE HCL 5 MG PO TABS: 5.0000 mg | ORAL_TABLET | Freq: Two times a day (BID) | ORAL | 5 refills | Status: DC

## 2017-10-24 ENCOUNTER — Telehealth: Payer: Self-pay | Admitting: Family Medicine

## 2017-10-24 NOTE — Telephone Encounter (Signed)
Pt's daughter Gavin Pound is requesting CMA return her call because would like to discuss pt's BP before her OV next week. Gavin Pound is on pt's DPR. Please advise. Thanks TNP

## 2017-10-24 NOTE — Telephone Encounter (Signed)
Mrs. Kerri Ramirez called to give [place] an update for her mother Mrs. Kerri Ramirez. Kerri Ramirez reports that her mother may not be taking Lisinopril 40 mg daily. Kerri Ramirez reports that Kerri Ramirez is having more memory issues and that today Kerri Ramirez had to re-do Kerri Ramirez's medication box for the next 2 weeks. Pt has appt on 11/01/17. Kerri Ramirez reports that she has not noticed any swelling or other symptoms.

## 2017-10-25 NOTE — Telephone Encounter (Signed)
Sounds good to restart it. Watch BP to ensure it doesn't drop low with resuming this medicine.  Erasmo Downer, MD, MPH [facility] 10/25/2017 8:05 AM

## 2017-11-01 ENCOUNTER — Ambulatory Visit: Payer: Self-pay | Admitting: *Deleted

## 2017-11-01 ENCOUNTER — Telehealth: Payer: Self-pay | Admitting: Family Medicine

## 2017-11-01 ENCOUNTER — Encounter: Payer: Self-pay | Admitting: Family Medicine

## 2017-11-01 ENCOUNTER — Ambulatory Visit (INDEPENDENT_AMBULATORY_CARE_PROVIDER_SITE_OTHER): Payer: Medicare Other | Admitting: Family Medicine

## 2017-11-01 VITALS — BP 176/70 | HR 87 | Temp 98.5°F | Wt 161.2 lb

## 2017-11-01 DIAGNOSIS — Z23 Encounter for immunization: Secondary | ICD-10-CM

## 2017-11-01 DIAGNOSIS — I10 Essential (primary) hypertension: Secondary | ICD-10-CM | POA: Diagnosis not present

## 2017-11-01 DIAGNOSIS — D692 Other nonthrombocytopenic purpura: Secondary | ICD-10-CM | POA: Insufficient documentation

## 2017-11-01 DIAGNOSIS — E870 Hyperosmolality and hypernatremia: Secondary | ICD-10-CM | POA: Diagnosis not present

## 2017-11-01 DIAGNOSIS — F0390 Unspecified dementia without behavioral disturbance: Secondary | ICD-10-CM

## 2017-11-01 NOTE — Assessment & Plan Note (Signed)
Noted on exam Denies recent falls

## 2017-11-01 NOTE — Assessment & Plan Note (Signed)
Noted on last BMP Suspect this is related to dehydration Suspect that due to her dementia, she is not drinking fluids well throughout the day Recheck renal function panel today

## 2017-11-01 NOTE — Telephone Encounter (Signed)
Pt's daughter called wanting to know how her mom's blood pressure was today when she came in to see Dr. Leonard SchwartzB.  Daughters CB#  [phone]  Thanks [name]

## 2017-11-01 NOTE — Progress Notes (Signed)
Patient: Kerri Ramirez Female    DOB: 1925-06-05   84 y.o.   MRN: [id number] Visit Date: 11/01/2017  Today's Provider: Shirlee LatchAngela Bacigalupo, MD   Chief Complaint  Patient presents with  . Hypertension   Subjective:    I, Presley RaddleNikki Walston, CMA, am acting as a Neurosurgeonscribe for Shirlee LatchAngela Bacigalupo, MD.   HPI  Hypertension, follow-up:  BP Readings from Last 3 Encounters:  11/01/17 (!) 176/70  09/29/17 (!) 158/60  08/29/17 (!) 184/80    She was last seen for hypertension 1 months ago.  BP at that visit was 158/60. Management changes since that visit include increased Lisinopril to 40 mg. She reports good compliance with treatment. Daughter called on 10/24/2017 to report she thought patient was not taking medication. She is not having side effects.  She is exercising. She is adherent to low salt diet.   Outside blood pressures are not being checked. She is experiencing none.  Patient denies chest pain, chest pressure/discomfort, claudication, dyspnea, exertional chest pressure/discomfort, fatigue, irregular heart beat, lower extremity edema, near-syncope, orthopnea, palpitations, paroxysmal nocturnal dyspnea, syncope and tachypnea.   Cardiovascular risk factors include advanced age (older than 82 for men, 82 for women), dyslipidemia and hypertension.  Use of agents associated with hypertension: none.    Weight trend: stable Wt Readings from Last 3 Encounters:  11/01/17 161 lb 3.2 oz (73.1 kg)  09/29/17 164 lb (74.4 kg)  08/29/17 166 lb (75.3 kg)    Current diet: in general, a "healthy" diet    ------------------------------------------------------------------------ Patient has known dementia (for which she is prescribed Namenda).  She does not check BP at home or bring medications to her visits.    Allergies  Allergen Reactions  . Codeine Other (See Comments)    Made head spin      Current Outpatient Medications:  .  lisinopril (PRINIVIL,ZESTRIL) 40 MG tablet, Take 1  tablet (40 mg total) by mouth daily., Disp: 30 tablet, Rfl: 2 .  memantine (NAMENDA) 5 MG tablet, Take 1 tablet (5 mg total) by mouth 2 (two) times daily., Disp: 60 tablet, Rfl: 5 .  Omega-3 Fatty Acids (FISH OIL) 1000 MG CAPS, Take 1,000 mg by mouth daily. , Disp: , Rfl:   Review of Systems  Constitutional: Negative.   Respiratory: Negative.   Cardiovascular: Negative.   Musculoskeletal: Negative.     Social History   Tobacco Use  . Smoking status: Former Smoker    Years: 2.00    Types: Cigarettes    Last attempt to quit: 02/16/1944    Years since quitting: 73.7  . Smokeless tobacco: Never Used  Substance Use Topics  . Alcohol use: No   Objective:   BP (!) 176/70 (BP Location: Right Arm, Patient Position: Sitting, Cuff Size: Normal)   Pulse 87   Temp 98.5 F (36.9 C) (Oral)   Wt 161 lb 3.2 oz (73.1 kg)   SpO2 99%   BMI 27.67 kg/m  Vitals:   11/01/17 0841  BP: (!) 176/70  Pulse: 87  Temp: 98.5 F (36.9 C)  TempSrc: Oral  SpO2: 99%  Weight: 161 lb 3.2 oz (73.1 kg)     Physical Exam  Constitutional: She appears well-developed. No distress.  HENT:  Head: Normocephalic and atraumatic.  Mouth/Throat: Oropharynx is clear and moist.  Eyes: Conjunctivae are normal. No scleral icterus.  Neck: Neck supple. No thyromegaly present.  Cardiovascular: Normal rate, regular rhythm and intact distal pulses.  Murmur heard. Pulmonary/Chest: Effort normal  and breath sounds normal. No respiratory distress. She has no wheezes. She has no rales.  Musculoskeletal: She exhibits no edema.  Lymphadenopathy:    She has no cervical adenopathy.  Neurological: She is alert. She is disoriented.  Skin: Skin is warm and dry. Capillary refill takes less than 2 seconds.  +senile purpura  Psychiatric: She has a normal mood and affect. Her behavior is normal.  Vitals reviewed.       Assessment & Plan:   Problem List Items Addressed This Visit      Cardiovascular and Mediastinum    Essential hypertension - Primary    Uncontrolled I have concerns that the patient is not taking her medication regularly, if at all I am concerned about adding more medications because of believe she may become hypotensive if she did decide to take all of these 1 day given that she is not regularly taking her medications now Will consult with CCM to see if there are any interventions in the home that may be able to help with compliance her medications      Relevant Orders   Renal Function Panel   Referral to Chronic Care Management Services   Senile purpura (HCC)    Noted on exam Denies recent falls        Nervous and Auditory   Dementia    Chronic and stable I am starting to see signs that she and her husband may need some help in the home and may not be functioning well and independently I have concerns that she is not taking her medication regularly as above She did not tolerate Aricept secondary to diarrhea She is tolerating Namenda 5 mg twice daily well CCM referral to see if there are any interventions that may be able to help in the home        Other   Hypernatremia    Noted on last BMP Suspect this is related to dehydration Suspect that due to her dementia, she is not drinking fluids well throughout the day Recheck renal function panel today      Relevant Orders   Renal Function Panel    Other Visit Diagnoses    Need for influenza vaccination       Relevant Orders   Flu vaccine HIGH DOSE PF (Completed)       Return in about 4 weeks (around 11/29/2017) for BP f/u.   The entirety of the information documented in the History of Present Illness, Review of Systems and Physical Exam were personally obtained by me. Portions of this information were initially documented by Presley Raddle, CMA and reviewed by me for thoroughness and accuracy.    Erasmo Downer, MD, MPH [facility] 11/01/2017 10:14 AM

## 2017-11-01 NOTE — Telephone Encounter (Signed)
Patient's daughter Kerri Ramirez advised of patient's blood pressure (on DPR)

## 2017-11-01 NOTE — Assessment & Plan Note (Signed)
Chronic and stable I am starting to see signs that she and her husband may need some help in the home and may not be functioning well and independently I have concerns that she is not taking her medication regularly as above She did not tolerate Aricept secondary to diarrhea She is tolerating Namenda 5 mg twice daily well CCM referral to see if there are any interventions that may be able to help in the home

## 2017-11-01 NOTE — Chronic Care Management (AMB) (Addendum)
  Chronic Care Management   Note  11/01/2017 Name: Kerri Ramirez MRN: [id number] DOB: September 20, 1925  Kerri Ramirez was referred today while in office for a PCP appointment to the care management team for assistance with chronic disease management related to hypertension, dementia, and medication adherence.   Goals Addressed            This Visit's Progress   . "I need help with my blood pressure. Its too high" (pt-stated)       Clinical Goal: Over the next 14 days, patient and/or POA/caregiver will verbalize understanding of plan of care for management of hypertension and medication adherence.   Interventions: I met with Mrs. Wattley in person and we discussed case management services. Mrs. Orzel indicated that she and her husband live together but her daughter visits regularly and is the person who handles most of her health care management needs including preparing medications for her weekly and intermittently checking her blood pressure. Mrs. Bagg indicated that she is taking her prescription medications daily but only thought she was taking 1 prescribed medication. Dr. Brita Romp notes that Mrs. Orzel's blood pressure has not improved since she prescribed lisinopril and Mrs. Gavigan was unable to tell me today when and even definitively if she is taking lisinopril at all.   I asked Mrs. Gloster for permission to speak with her daughter Kelly [facility]) about her health care management needs and she agreed stating "I would rather you talk to her because she is really the one who keeps up with things."   Plan: I will reach out to Mrs. Kriegel's daughter by phone to further discuss Mrs. Martelle's care management needs.   Ms. Barrack was given information about Chronic Care Management services today including:  1. CCM service includes personalized support from designated clinical staff supervised by her physician, including individualized plan of care and coordination with other  care providers 2. 24/7 contact phone numbers for assistance for urgent and routine care needs. 3. Service will only be billed when office clinical staff spend 20 minutes or more in a month to coordinate care. 4. Only one practitioner may furnish and bill the service in a calendar month. 5. The patient may stop CCM services at any time (effective at the end of the month) by phone call to the office staff. 6. The patient will be responsible for cost sharing (co-pay) of up to 20% of the service fee (after annual deductible is met).  Patient agreed to services and verbal consent obtained.  [facility] [phone]

## 2017-11-01 NOTE — Assessment & Plan Note (Signed)
Uncontrolled I have concerns that the patient is not taking her medication regularly, if at all I am concerned about adding more medications because of believe she may become hypotensive if she did decide to take all of these 1 day given that she is not regularly taking her medications now Will consult with CCM to see if there are any interventions in the home that may be able to help with compliance her medications

## 2017-11-02 ENCOUNTER — Ambulatory Visit: Payer: Self-pay | Admitting: *Deleted

## 2017-11-02 ENCOUNTER — Telehealth: Payer: Self-pay

## 2017-11-02 DIAGNOSIS — I10 Essential (primary) hypertension: Secondary | ICD-10-CM | POA: Diagnosis not present

## 2017-11-02 DIAGNOSIS — F0390 Unspecified dementia without behavioral disturbance: Secondary | ICD-10-CM | POA: Diagnosis not present

## 2017-11-02 LAB — RENAL FUNCTION PANEL
Albumin: 4.1 g/dL (ref 3.2–4.6)
BUN / CREAT RATIO: 29 — AB (ref 12–28)
BUN: 33 mg/dL (ref 10–36)
CO2: 13 mmol/L — AB (ref 20–29)
CREATININE: 1.12 mg/dL — AB (ref 0.57–1.00)
Calcium: 10.4 mg/dL — ABNORMAL HIGH (ref 8.7–10.3)
Chloride: 114 mmol/L — ABNORMAL HIGH (ref 96–106)
GFR calc non Af Amer: 43 mL/min/{1.73_m2} — ABNORMAL LOW (ref >59)
GFR, EST AFRICAN AMERICAN: 50 mL/min/{1.73_m2} — AB (ref >59)
Glucose: 96 mg/dL (ref 65–99)
Phosphorus: 3.1 mg/dL (ref 2.5–4.5)
Potassium: 4.6 mmol/L (ref 3.5–5.2)
SODIUM: 141 mmol/L (ref 134–144)

## 2017-11-02 NOTE — Chronic Care Management (AMB) (Signed)
  Chronic Care Management   Note  11/02/2017 Name: Kerri Ramirez MRN: [id number] DOB: Dec 16, 1925  Unsuccessful outreach attempt to [name]'s daughter/POA this afternoon. I left a HIPPA compliant voice message requesting a return call.   Plan: I will attempt to contact [name]'s daughter by phone over the next 48 hours.    Marja KaysAlisa Gilboy MHA,BSN,RN,CCM Nurse Care Coordinator [facility] [phone]

## 2017-11-02 NOTE — Telephone Encounter (Signed)
Patient advised as below.  

## 2017-11-02 NOTE — Patient Instructions (Signed)
Someone from our CCM (Chronic Care Management) Team will call you.   Janalyn Shy MHA,BSN,RN,CCM Nurse Care Coordinator  [phone]   Ruben Reason PharmD  Clinical Pharmacist  [phone]   Kerri Ramirez was given information about Chronic Care Management services today including:  1. CCM service includes personalized support from designated clinical staff supervised by her physician, including individualized plan of care and coordination with other care providers 2. 24/7 contact phone numbers for assistance for urgent and routine care needs. 3. Service will only be billed when office clinical staff spend 20 minutes or more in a month to coordinate care. 4. Only one practitioner may furnish and bill the service in a calendar month. 5. The patient may stop CCM services at any time (effective at the end of the month) by phone call to the office staff. 6. The patient will be responsible for cost sharing (co-pay) of up to 20% of the service fee (after annual deductible is met).  Patient agreed to services and verbal consent obtained.

## 2017-11-02 NOTE — Telephone Encounter (Signed)
-----   Message from Erasmo DownerAngela M Bacigalupo, MD sent at 11/02/2017  8:04 AM EDT ----- Kidney function is slightly worse.  Make sure you are staying well hydrated throughout the day  Erasmo DownerBacigalupo, Angela M, MD, MPH [facility] 11/02/2017 8:04 AM

## 2017-11-07 ENCOUNTER — Ambulatory Visit: Payer: Self-pay | Admitting: *Deleted

## 2017-11-07 DIAGNOSIS — I10 Essential (primary) hypertension: Secondary | ICD-10-CM

## 2017-11-07 DIAGNOSIS — F0390 Unspecified dementia without behavioral disturbance: Secondary | ICD-10-CM

## 2017-11-07 NOTE — Patient Instructions (Signed)
Please call someone from our CCM (Chronic Care Management) Team if we can provide assistance.   Marja KaysAlisa Gilboy MHA,BSN,RN,CCM Nurse Care Coordinator  [phone]   Karalee HeightJulie Hedrick PharmD  Clinical Pharmacist  [phone]

## 2017-11-07 NOTE — Chronic Care Management (AMB) (Signed)
  Chronic Care Management   Follow Up Note   11/07/2017 Name: Kerri Ramirez MRN: [id number] DOB: 01/24/1926  Referred by: Erasmo DownerBacigalupo, Angela M, MD Reason for referral : Care Coordination (f/u in home care, BP management)  Subjective: "We know she's not taking her medicine but I don't think she'll ever take it right unless someone is there to put it in her mouth."  Objective: BP Readings from Last 3 Encounters:  11/01/17 (!) 176/70  09/29/17 (!) 158/60  08/29/17 (!) 184/80    Assessment: 84 year old female patient with HTN and Dementia, referred to the CCM team for assistance with chronic disease management related to hypertension, dementia, and medication adherence.   Goals Addressed    . "I need help with my blood pressure. Its too high" (pt-stated)        Clinical Goals: Patient and/or family/caregiver team will contact CCM team re: any change in level of care needs/desires or other care management/care coordination goals.   Interventions: Discussion with daughter/POA re: patient's needs, dementia care options, level of care options including in home care services, ALF, residential dementia care. Offered CCM care team contact info. Dtr states that family wishes to continue to try to care for patient at home. Does not wish to hire in home care services and does not wish to explore level of care options at this time.     Plan: Dtr/POA will contact PCP or CCM team in the future for assistance with in home care needs, level of care assistance, or other disease management needs. RNCM will continue to investigate dementia care assistance options and will provide dtr/POA with updated information as available.   Marja KaysAlisa Gilboy MHA,BSN,RN,CCM Nurse Care Coordinator [facility] [phone]

## 2017-11-28 ENCOUNTER — Encounter: Payer: Self-pay | Admitting: Family Medicine

## 2017-11-28 ENCOUNTER — Ambulatory Visit (INDEPENDENT_AMBULATORY_CARE_PROVIDER_SITE_OTHER): Payer: Medicare Other | Admitting: Family Medicine

## 2017-11-28 VITALS — BP 162/60 | HR 97 | Temp 97.8°F | Wt 159.8 lb

## 2017-11-28 DIAGNOSIS — I10 Essential (primary) hypertension: Secondary | ICD-10-CM

## 2017-11-28 DIAGNOSIS — Z9114 Patient's other noncompliance with medication regimen: Secondary | ICD-10-CM

## 2017-11-28 DIAGNOSIS — F0390 Unspecified dementia without behavioral disturbance: Secondary | ICD-10-CM

## 2017-11-28 NOTE — Progress Notes (Signed)
Patient: Kerri Ramirez Female    DOB: [birth year]   84 y.o.   MRN: [id number] Visit Date: 11/28/2017  Today's Provider: Shirlee Latch, MD   Chief Complaint  Patient presents with  . Hypertension   Subjective:    I, Presley Raddle, CMA, am acting as a Neurosurgeon for Shirlee Latch, MD.   HPI  Hypertension, follow-up:     BP Readings from Last 3 Encounters:  11/01/17 (!) 176/70  09/29/17 (!) 158/60  08/29/17 (!) 184/80    She was last seen for hypertension 1 month ago.  BP at that visit was 176/70. Management changes since that visit include referral to CCM because of concerns of patient not taking medications regularly. She reports fair compliance with treatment. Patient did not bring medication to appointment today. Patient's daughter is with her today and states she thinks patient is not taking medications regularly.  She is not having side effects.  She is exercising. She is adherent to low salt diet.   Outside blood pressures are not being checked. She is experiencing none.  Patient denies chest pain, chest pressure/discomfort, claudication, dyspnea, exertional chest pressure/discomfort, fatigue, irregular heart beat, lower extremity edema, near-syncope, orthopnea, palpitations, paroxysmal nocturnal dyspnea, syncope and tachypnea.   Cardiovascular risk factors include advanced age (older than 53 for men, 29 for women), dyslipidemia and hypertension.  Use of agents associated with hypertension: none.               Weight trend: stable    Wt Readings from Last 3 Encounters:  11/01/17 161 lb 3.2 oz (73.1 kg)  09/29/17 164 lb (74.4 kg)  08/29/17 166 lb (75.3 kg)    Current diet: in general, a "healthy" diet        Allergies  Allergen Reactions  . Codeine Other (See Comments)    Made head spin      Current Outpatient Medications:  .  lisinopril (PRINIVIL,ZESTRIL) 40 MG tablet, Take 1 tablet (40 mg total) by mouth daily., Disp: 30 tablet,  Rfl: 2 .  memantine (NAMENDA) 5 MG tablet, Take 1 tablet (5 mg total) by mouth 2 (two) times daily., Disp: 60 tablet, Rfl: 5 .  Omega-3 Fatty Acids (FISH OIL) 1000 MG CAPS, Take 1,000 mg by mouth daily. , Disp: , Rfl:   Review of Systems  Constitutional: Negative.   Respiratory: Negative.   Cardiovascular: Negative.   Musculoskeletal: Negative.     Social History   Tobacco Use  . Smoking status: Former Smoker    Years: 2.00    Types: Cigarettes    Last attempt to quit: 02/16/1944    Years since quitting: 73.8  . Smokeless tobacco: Never Used  Substance Use Topics  . Alcohol use: No   Objective:   BP (!) 162/60 (BP Location: Left Arm, Patient Position: Sitting, Cuff Size: Normal)   Pulse 97   Temp 97.8 F (36.6 C) (Oral)   Wt 159 lb 12.8 oz (72.5 kg)   SpO2 96%   BMI 27.43 kg/m  Vitals:   11/28/17 0949  BP: (!) 162/60  Pulse: 97  Temp: 97.8 F (36.6 C)  TempSrc: Oral  SpO2: 96%  Weight: 159 lb 12.8 oz (72.5 kg)     Physical Exam  Constitutional: She is oriented to person, place, and time. She appears well-developed and well-nourished. No distress.  HENT:  Head: Normocephalic and atraumatic.  Mouth/Throat: Oropharynx is clear and moist.  Eyes: Conjunctivae are normal. No scleral icterus.  Neck: Neck supple. No thyromegaly present.  Cardiovascular: Normal rate, regular rhythm, normal heart sounds and intact distal pulses.  No murmur heard. Pulmonary/Chest: Effort normal and breath sounds normal. No respiratory distress. She has no wheezes. She has no rales.  Abdominal: Soft. She exhibits no distension. There is no tenderness.  Musculoskeletal: She exhibits no edema.  Lymphadenopathy:    She has no cervical adenopathy.  Neurological: She is alert and oriented to person, place, and time.  Skin: Skin is warm and dry. Capillary refill takes less than 2 seconds. No rash noted.  Psychiatric: She has a normal mood and affect. Her behavior is normal.  Vitals  reviewed.       Assessment & Plan:   Problem List Items Addressed This Visit      Cardiovascular and Mediastinum   Essential hypertension - Primary    Remains uncontrolled  Patient's daughter and I both have concerns that the patient is not taking her medication regularly, if at all Patient is convinced that she is taking her medications daily Concerned about adding more medications because she may become hypotensive if she did decide to take all of these 1 day given that she is not regularly taking her medications now She has consulted with CCM I will also get her home health nurse to check on compliance and check blood pressure at home      Relevant Orders   Ambulatory referral to Home Health     Nervous and Auditory   Dementia [facility])    Chronic and stable Hope that home health nurse may be able to help with medications at home CCM is also working with the patient She did not tolerate Aricept secondary to diarrhea She is tolerating Namenda 5 mg twice daily well      Relevant Orders   Ambulatory referral to Home Health    Other Visit Diagnoses    Variable compliance with medication therapy       Relevant Orders   Ambulatory referral to Home Health       Return in about 3 months (around 02/28/2018) for AWV/chronic disease f/u.   The entirety of the information documented in the History of Present Illness, Review of Systems and Physical Exam were personally obtained by me. Portions of this information were initially documented by Presley Raddle, CMA and reviewed by me for thoroughness and accuracy.    Erasmo Downer, MD, MPH [facility] 11/28/2017 1:04 PM

## 2017-11-28 NOTE — Assessment & Plan Note (Addendum)
Remains uncontrolled  Patient's daughter and I both have concerns that the patient is not taking her medication regularly, if at all Patient is convinced that she is taking her medications daily Concerned about adding more medications because she may become hypotensive if she did decide to take all of these 1 day given that she is not regularly taking her medications now She has consulted with CCM I will also get her home health nurse to check on compliance and check blood pressure at home

## 2017-11-28 NOTE — Assessment & Plan Note (Signed)
Chronic and stable Hope that home health nurse may be able to help with medications at home CCM is also working with the patient She did not tolerate Aricept secondary to diarrhea She is tolerating Namenda 5 mg twice daily well

## 2017-11-29 ENCOUNTER — Ambulatory Visit: Payer: Medicare Other | Admitting: Family Medicine

## 2017-11-30 DIAGNOSIS — Z9114 Patient's other noncompliance with medication regimen: Secondary | ICD-10-CM | POA: Diagnosis not present

## 2017-11-30 DIAGNOSIS — E785 Hyperlipidemia, unspecified: Secondary | ICD-10-CM | POA: Diagnosis not present

## 2017-11-30 DIAGNOSIS — I10 Essential (primary) hypertension: Secondary | ICD-10-CM | POA: Diagnosis not present

## 2017-11-30 DIAGNOSIS — F0390 Unspecified dementia without behavioral disturbance: Secondary | ICD-10-CM | POA: Diagnosis not present

## 2017-12-02 DIAGNOSIS — E785 Hyperlipidemia, unspecified: Secondary | ICD-10-CM | POA: Diagnosis not present

## 2017-12-02 DIAGNOSIS — I10 Essential (primary) hypertension: Secondary | ICD-10-CM | POA: Diagnosis not present

## 2017-12-02 DIAGNOSIS — Z9114 Patient's other noncompliance with medication regimen: Secondary | ICD-10-CM | POA: Diagnosis not present

## 2017-12-02 DIAGNOSIS — F0390 Unspecified dementia without behavioral disturbance: Secondary | ICD-10-CM | POA: Diagnosis not present

## 2017-12-06 DIAGNOSIS — I10 Essential (primary) hypertension: Secondary | ICD-10-CM | POA: Diagnosis not present

## 2017-12-06 DIAGNOSIS — E785 Hyperlipidemia, unspecified: Secondary | ICD-10-CM | POA: Diagnosis not present

## 2017-12-06 DIAGNOSIS — Z9114 Patient's other noncompliance with medication regimen: Secondary | ICD-10-CM | POA: Diagnosis not present

## 2017-12-06 DIAGNOSIS — F0390 Unspecified dementia without behavioral disturbance: Secondary | ICD-10-CM | POA: Diagnosis not present

## 2017-12-09 ENCOUNTER — Other Ambulatory Visit: Payer: Self-pay | Admitting: Family Medicine

## 2017-12-09 DIAGNOSIS — F0390 Unspecified dementia without behavioral disturbance: Secondary | ICD-10-CM | POA: Diagnosis not present

## 2017-12-09 DIAGNOSIS — Z9114 Patient's other noncompliance with medication regimen: Secondary | ICD-10-CM | POA: Diagnosis not present

## 2017-12-09 DIAGNOSIS — E785 Hyperlipidemia, unspecified: Secondary | ICD-10-CM | POA: Diagnosis not present

## 2017-12-09 DIAGNOSIS — I10 Essential (primary) hypertension: Secondary | ICD-10-CM | POA: Diagnosis not present

## 2017-12-13 DIAGNOSIS — I10 Essential (primary) hypertension: Secondary | ICD-10-CM | POA: Diagnosis not present

## 2017-12-13 DIAGNOSIS — E785 Hyperlipidemia, unspecified: Secondary | ICD-10-CM | POA: Diagnosis not present

## 2017-12-13 DIAGNOSIS — F0390 Unspecified dementia without behavioral disturbance: Secondary | ICD-10-CM | POA: Diagnosis not present

## 2017-12-13 DIAGNOSIS — Z9114 Patient's other noncompliance with medication regimen: Secondary | ICD-10-CM | POA: Diagnosis not present

## 2017-12-16 DIAGNOSIS — E785 Hyperlipidemia, unspecified: Secondary | ICD-10-CM | POA: Diagnosis not present

## 2017-12-16 DIAGNOSIS — Z9114 Patient's other noncompliance with medication regimen: Secondary | ICD-10-CM | POA: Diagnosis not present

## 2017-12-16 DIAGNOSIS — I10 Essential (primary) hypertension: Secondary | ICD-10-CM | POA: Diagnosis not present

## 2017-12-16 DIAGNOSIS — F0390 Unspecified dementia without behavioral disturbance: Secondary | ICD-10-CM | POA: Diagnosis not present

## 2017-12-23 DIAGNOSIS — F0390 Unspecified dementia without behavioral disturbance: Secondary | ICD-10-CM | POA: Diagnosis not present

## 2017-12-23 DIAGNOSIS — Z9114 Patient's other noncompliance with medication regimen: Secondary | ICD-10-CM | POA: Diagnosis not present

## 2017-12-23 DIAGNOSIS — E785 Hyperlipidemia, unspecified: Secondary | ICD-10-CM | POA: Diagnosis not present

## 2017-12-23 DIAGNOSIS — I10 Essential (primary) hypertension: Secondary | ICD-10-CM | POA: Diagnosis not present

## 2017-12-29 DIAGNOSIS — Z9114 Patient's other noncompliance with medication regimen: Secondary | ICD-10-CM | POA: Diagnosis not present

## 2017-12-29 DIAGNOSIS — F0390 Unspecified dementia without behavioral disturbance: Secondary | ICD-10-CM | POA: Diagnosis not present

## 2017-12-29 DIAGNOSIS — E785 Hyperlipidemia, unspecified: Secondary | ICD-10-CM | POA: Diagnosis not present

## 2017-12-29 DIAGNOSIS — I10 Essential (primary) hypertension: Secondary | ICD-10-CM | POA: Diagnosis not present

## 2018-01-05 DIAGNOSIS — Z9114 Patient's other noncompliance with medication regimen: Secondary | ICD-10-CM | POA: Diagnosis not present

## 2018-01-05 DIAGNOSIS — F0390 Unspecified dementia without behavioral disturbance: Secondary | ICD-10-CM | POA: Diagnosis not present

## 2018-01-05 DIAGNOSIS — E785 Hyperlipidemia, unspecified: Secondary | ICD-10-CM | POA: Diagnosis not present

## 2018-01-05 DIAGNOSIS — I10 Essential (primary) hypertension: Secondary | ICD-10-CM | POA: Diagnosis not present

## 2018-01-13 DIAGNOSIS — Z9114 Patient's other noncompliance with medication regimen: Secondary | ICD-10-CM | POA: Diagnosis not present

## 2018-01-13 DIAGNOSIS — F0390 Unspecified dementia without behavioral disturbance: Secondary | ICD-10-CM | POA: Diagnosis not present

## 2018-01-13 DIAGNOSIS — E785 Hyperlipidemia, unspecified: Secondary | ICD-10-CM | POA: Diagnosis not present

## 2018-01-13 DIAGNOSIS — I10 Essential (primary) hypertension: Secondary | ICD-10-CM | POA: Diagnosis not present

## 2018-01-20 DIAGNOSIS — F0390 Unspecified dementia without behavioral disturbance: Secondary | ICD-10-CM | POA: Diagnosis not present

## 2018-01-20 DIAGNOSIS — I10 Essential (primary) hypertension: Secondary | ICD-10-CM | POA: Diagnosis not present

## 2018-01-20 DIAGNOSIS — Z9114 Patient's other noncompliance with medication regimen: Secondary | ICD-10-CM | POA: Diagnosis not present

## 2018-01-20 DIAGNOSIS — E785 Hyperlipidemia, unspecified: Secondary | ICD-10-CM | POA: Diagnosis not present

## 2018-01-27 DIAGNOSIS — I10 Essential (primary) hypertension: Secondary | ICD-10-CM | POA: Diagnosis not present

## 2018-01-27 DIAGNOSIS — E785 Hyperlipidemia, unspecified: Secondary | ICD-10-CM | POA: Diagnosis not present

## 2018-01-27 DIAGNOSIS — Z9114 Patient's other noncompliance with medication regimen: Secondary | ICD-10-CM | POA: Diagnosis not present

## 2018-01-27 DIAGNOSIS — F0390 Unspecified dementia without behavioral disturbance: Secondary | ICD-10-CM | POA: Diagnosis not present

## 2018-01-29 DIAGNOSIS — I10 Essential (primary) hypertension: Secondary | ICD-10-CM | POA: Diagnosis not present

## 2018-01-29 DIAGNOSIS — E785 Hyperlipidemia, unspecified: Secondary | ICD-10-CM | POA: Diagnosis not present

## 2018-01-29 DIAGNOSIS — F0390 Unspecified dementia without behavioral disturbance: Secondary | ICD-10-CM | POA: Diagnosis not present

## 2018-01-29 DIAGNOSIS — Z9114 Patient's other noncompliance with medication regimen: Secondary | ICD-10-CM | POA: Diagnosis not present

## 2018-02-01 DIAGNOSIS — F0390 Unspecified dementia without behavioral disturbance: Secondary | ICD-10-CM | POA: Diagnosis not present

## 2018-02-01 DIAGNOSIS — I10 Essential (primary) hypertension: Secondary | ICD-10-CM | POA: Diagnosis not present

## 2018-02-01 DIAGNOSIS — E785 Hyperlipidemia, unspecified: Secondary | ICD-10-CM | POA: Diagnosis not present

## 2018-02-01 DIAGNOSIS — Z9114 Patient's other noncompliance with medication regimen: Secondary | ICD-10-CM | POA: Diagnosis not present

## 2018-02-07 DIAGNOSIS — E785 Hyperlipidemia, unspecified: Secondary | ICD-10-CM | POA: Diagnosis not present

## 2018-02-07 DIAGNOSIS — F0390 Unspecified dementia without behavioral disturbance: Secondary | ICD-10-CM | POA: Diagnosis not present

## 2018-02-07 DIAGNOSIS — Z9114 Patient's other noncompliance with medication regimen: Secondary | ICD-10-CM | POA: Diagnosis not present

## 2018-02-07 DIAGNOSIS — I10 Essential (primary) hypertension: Secondary | ICD-10-CM | POA: Diagnosis not present

## 2018-02-18 DIAGNOSIS — F0390 Unspecified dementia without behavioral disturbance: Secondary | ICD-10-CM | POA: Diagnosis not present

## 2018-02-18 DIAGNOSIS — E785 Hyperlipidemia, unspecified: Secondary | ICD-10-CM | POA: Diagnosis not present

## 2018-02-18 DIAGNOSIS — I10 Essential (primary) hypertension: Secondary | ICD-10-CM | POA: Diagnosis not present

## 2018-02-18 DIAGNOSIS — Z9114 Patient's other noncompliance with medication regimen: Secondary | ICD-10-CM | POA: Diagnosis not present

## 2018-02-22 DIAGNOSIS — E785 Hyperlipidemia, unspecified: Secondary | ICD-10-CM | POA: Diagnosis not present

## 2018-02-22 DIAGNOSIS — I10 Essential (primary) hypertension: Secondary | ICD-10-CM | POA: Diagnosis not present

## 2018-02-22 DIAGNOSIS — F0390 Unspecified dementia without behavioral disturbance: Secondary | ICD-10-CM | POA: Diagnosis not present

## 2018-02-22 DIAGNOSIS — Z9114 Patient's other noncompliance with medication regimen: Secondary | ICD-10-CM | POA: Diagnosis not present

## 2018-03-02 ENCOUNTER — Ambulatory Visit (INDEPENDENT_AMBULATORY_CARE_PROVIDER_SITE_OTHER): Payer: Medicare Other | Admitting: Family Medicine

## 2018-03-02 ENCOUNTER — Encounter: Payer: Self-pay | Admitting: Family Medicine

## 2018-03-02 ENCOUNTER — Ambulatory Visit (INDEPENDENT_AMBULATORY_CARE_PROVIDER_SITE_OTHER): Payer: Medicare Other

## 2018-03-02 VITALS — BP 134/50 | HR 87 | Temp 98.1°F | Ht 64.0 in | Wt 158.4 lb

## 2018-03-02 DIAGNOSIS — I10 Essential (primary) hypertension: Secondary | ICD-10-CM | POA: Diagnosis not present

## 2018-03-02 DIAGNOSIS — Z0000 Encounter for general adult medical examination without abnormal findings: Secondary | ICD-10-CM

## 2018-03-02 DIAGNOSIS — M810 Age-related osteoporosis without current pathological fracture: Secondary | ICD-10-CM | POA: Diagnosis not present

## 2018-03-02 DIAGNOSIS — Z23 Encounter for immunization: Secondary | ICD-10-CM | POA: Diagnosis not present

## 2018-03-02 NOTE — Progress Notes (Addendum)
Patient: Kerri Ramirez, Female    DOB: 30-Aug-1925, 85 y.o.   MRN: [id number] Visit Date: 03/03/2018  Today's Provider: Shirlee Latch, MD   Chief Complaint  Patient presents with  . Hypertension   Subjective:    Patient had a AWE with McKenzie today prior to appointment  F/u chronic medical conditions Kerri Ramirez is a 85 y.o. female. She feels well. She reports exercising none. She reports she is sleeping well.  She agrees to pneumoccocal vaccine She wants to take less medication  HTN: - Medications: lisinopril 40mg  daily - Compliance: good - Checking BP at home: no - Denies any SOB, CP, vision changes, LE edema, medication SEs, or symptoms of hypotension  -----------------------------------------------------------   Review of Systems  Constitutional: Negative.   HENT: Positive for hearing loss.   Eyes: Negative.   Respiratory: Negative.   Cardiovascular: Negative.   Gastrointestinal: Positive for diarrhea.  Endocrine: Positive for cold intolerance.  Genitourinary: Negative.   Musculoskeletal: Negative.   Skin: Negative.   Allergic/Immunologic: Negative.   Neurological: Positive for weakness.  Hematological: Bruises/bleeds easily.  Psychiatric/Behavioral: Positive for agitation, confusion and decreased concentration.    Social History   Socioeconomic History  . Marital status: Married    Spouse name: Not on file  . Number of children: 3  . Years of education: Not on file  . Highest education level: 6th grade  Occupational History  . Occupation: retired  Engineer, production  . Financial resource strain: Not hard at all  . Food insecurity:    Worry: Never true    Inability: Never true  . Transportation needs:    Medical: No    Non-medical: No  Tobacco Use  . Smoking status: Former Smoker    Years: 2.00    Types: Cigarettes    Last attempt to quit: 02/16/1944    Years since quitting: 74.0  . Smokeless tobacco: Never Used  Substance and  Sexual Activity  . Alcohol use: No  . Drug use: No  . Sexual activity: Not on file  Lifestyle  . Physical activity:    Days per week: 0 days    Minutes per session: 0 min  . Stress: Not at all  Relationships  . Social connections:    Talks on phone: Patient refused    Gets together: Patient refused    Attends religious service: Patient refused    Active member of club or organization: Patient refused    Attends meetings of clubs or organizations: Patient refused    Relationship status: Patient refused  . Intimate partner violence:    Fear of current or ex partner: No    Emotionally abused: No    Physically abused: No    Forced sexual activity: No  Other Topics Concern  . Not on file  Social History Narrative   1 son deceased- suicide    Past Medical History:  Diagnosis Date  . Hypertension      Patient Active Problem List   Diagnosis Date Noted  . Senile purpura (HCC) 11/01/2017  . Hypernatremia 11/01/2017  . Fracture of metacarpal bone 09/07/2016  . Ventral hernia without obstruction or gangrene 05/09/2015  . Dementia (HCC) 04/28/2015  . Acute thromboembolism of deep veins of lower extremity (HCC) 10/29/2014  . Acid reflux 10/29/2014  . Prediabetes 10/29/2014  . Gout 10/29/2014  . 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 HLD (hyperlipidemia) 10/29/2014  . Essential hypertension 10/29/2014  . Iron deficiency anemia 10/29/2014  . Colitis,  ischemic (HCC) 10/29/2014  . LBP (low back pain) 10/29/2014  . Adiposity 10/29/2014  . OP (osteoporosis) 10/29/2014  . Diarrhea 10/29/2014  . Telogen effluvium 10/29/2014  . Tubular adenoma of colon 11/22/1997    Past Surgical History:  Procedure Laterality Date  . ANKLE SURGERY Left 1981  . BACK SURGERY  1987   broken vertebra  . COLONOSCOPY     Dr Mechele Collin  . HEMICOLECTOMY Right 07-25-13   Dr Byrd/diverticulitis  . IVC FILTER PLACEMENT ([facility] HX)  09/01/2013    Her family history includes Heart attack in her brother,  brother, and father.      Current Outpatient Medications:  .  lisinopril (PRINIVIL,ZESTRIL) 40 MG tablet, TAKE 1 TABLET BY MOUTH ONCE DAILY, Disp: 90 tablet, Rfl: 3 .  memantine (NAMENDA) 5 MG tablet, Take 1 tablet (5 mg total) by mouth 2 (two) times daily., Disp: 60 tablet, Rfl: 5 .  Omega-3 Fatty Acids (FISH OIL) 1000 MG CAPS, Take 1,000 mg by mouth daily. , Disp: , Rfl:   Patient Care Team: Erasmo Downer, MD as PCP - General (Family Medicine) Lemar Livings Merrily Pew, MD (General Surgery) Oscar La, RN as Case Manager     Objective:   Vitals: BP (!) 134/50 (BP Location: Right Arm, Patient Position: Sitting, Cuff Size: Normal)   Pulse 87   Temp 98.1 F (36.7 C) (Oral)   Ht 5\' 4"  (1.626 m)   Wt 158 lb 6.4 oz (71.8 kg)   BMI 27.19 kg/m   Physical Exam Vitals signs reviewed.  Constitutional:      General: She is not in acute distress.    Appearance: Normal appearance. She is well-developed. She is not diaphoretic.  HENT:     Head: Normocephalic and atraumatic.     Right Ear: External ear normal.     Left Ear: External ear normal.     Nose: Nose normal.     Mouth/Throat:     Mouth: Mucous membranes are moist.     Pharynx: Oropharynx is clear. No oropharyngeal exudate.  Eyes:     General: No scleral icterus.    Conjunctiva/sclera: Conjunctivae normal.     Pupils: Pupils are equal, round, and reactive to light.  Neck:     Musculoskeletal: Neck supple.     Thyroid: No thyromegaly.  Cardiovascular:     Rate and Rhythm: Normal rate and regular rhythm.     Pulses: Normal pulses.     Heart sounds: Normal heart sounds. No murmur.  Pulmonary:     Effort: Pulmonary effort is normal. No respiratory distress.     Breath sounds: Normal breath sounds. No wheezing or rales.  Abdominal:     General: Bowel sounds are normal. There is no distension.     Palpations: Abdomen is soft.     Tenderness: There is no abdominal tenderness. There is no guarding or rebound.    Musculoskeletal:        General: No deformity.     Right lower leg: No edema.     Left lower leg: No edema.  Lymphadenopathy:     Cervical: No cervical adenopathy.  Skin:    General: Skin is warm and dry.     Capillary Refill: Capillary refill takes less than 2 seconds.     Findings: No rash.  Neurological:     Mental Status: She is alert and oriented to person, place, and time.  Psychiatric:        Mood and Affect: Mood normal.  Behavior: Behavior normal.        Thought Content: Thought content normal.     Activities of Daily Living In your present state of health, do you have any difficulty performing the following activities: 03/02/2018  Hearing? Y  Comment Does not wear hearing aid.   Vision? N  Difficulty concentrating or making decisions? Y  Comment On namenda 5 mg daily.  Walking or climbing stairs? N  Comment Avoids steps.  Dressing or bathing? N  Doing errands, shopping? Y  Comment Does not drive.   Preparing Food and eating ? N  Comment Husbands cooks.   Using the Toilet? N  In the past six months, have you accidently leaked urine? N  Do you have problems with loss of bowel control? N  Managing your Medications? N  Managing your Finances? Y  Comment Daughter manages finances.   Housekeeping or managing your Housekeeping? Y  Comment Husband cleans.   Some recent data might be hidden    Fall Risk Assessment Fall Risk  03/02/2018 02/24/2017 02/24/2016 10/29/2014  Falls in the past year? 1 No No No  Number falls in past yr: 1 - - -  Injury with Fall? 0 - - -  Risk for fall due to : Impaired mobility;Impaired balance/gait - - -  Follow up Falls prevention discussed - - -     Depression Screen PHQ 2/9 Scores 03/02/2018 02/24/2017 02/24/2016 10/29/2014  PHQ - 2 Score 5 0 0 0  PHQ- 9 Score 17 - - -    6CIT Screen 03/02/2018  What Year? 4 points  What month? 3 points  What time? 3 points  Count back from 20 0 points  Months in reverse 2 points  Repeat  phrase 10 points  Total Score 22     Assessment & Plan:    F/u AWV Reviewed patient's Family Medical History Reviewed and updated list of patient's medical providers Assessment of cognitive impairment was done Assessed patient's functional ability Established a written schedule for health screening services Health Risk Assessent Completed and Reviewed  Exercise Activities and Dietary recommendations Goals    . "I need help with my blood pressure. Its too high" (pt-stated)      Clinical Goals: Patient and/or family/caregiver team will contact CCM team re: any change in level of care needs/desires or other care management/care coordination goals.   Interventions: Discussion with daughter/POA re: patient's needs, dementia care options, level of care options including in home care services, ALF, residential dementia care. Offered CCM care team contact info.      Marland Kitchen. DIET - INCREASE WATER INTAKE     Recommend to continue drinking 6-8 glasses of water a day. Pt declined changing diet or exercising.     . Increase water intake     Starting 02/24/16, I will increase my water intake to 6 glasses a day.       Immunization History  Administered Date(s) Administered  . Influenza Split 11/10/2011  . Influenza, High Dose Seasonal PF 12/03/2013, 10/29/2014, 11/28/2015, 12/01/2016, 11/01/2017  . Influenza,inj,Quad PF,6+ Mos 12/06/2012  . Influenza-Unspecified 11/10/2011, 12/06/2012, 12/03/2013, 10/29/2014  . Pneumococcal Conjugate-13 12/03/2013  . Pneumococcal Polysaccharide-23 03/02/2018    Health Maintenance  Topic Date Due  . TETANUS/TDAP  11/02/2018 (Originally 07/08/1944)  . INFLUENZA VACCINE  Completed  . DEXA SCAN  Completed  . PNA vac Low Risk Adult  Completed     Discussed health benefits of physical activity, and encouraged her to engage in regular exercise appropriate  for her age and condition.      ------------------------------------------------------------------------------------------------------------  Problem List Items Addressed This Visit      Cardiovascular and Mediastinum   Essential hypertension - Primary    Well controlled Now taking medication with good compliance       Other Visit Diagnoses    Need for pneumococcal vaccination       Relevant Orders   Pneumococcal polysaccharide vaccine 23-valent greater than or equal to 2yo subcutaneous/IM (Completed)       Return in about 6 months (around 08/31/2018) for chronic disease f/u.   The entirety of the information documented in the History of Present Illness, Review of Systems and Physical Exam were personally obtained by me. Portions of this information were initially documented by Presley RaddleNikki Walston, CMA and reviewed by me for thoroughness and accuracy.    Erasmo DownerBacigalupo,  M, MD, MPH [facility] 03/03/2018 3:46 PM

## 2018-03-02 NOTE — Patient Instructions (Signed)
Preventive Care 83 Years and Older, Female Preventive care refers to lifestyle choices and visits with your health care provider that can promote health and wellness. What does preventive care include?  A yearly physical exam. This is also called an annual well check.  Dental exams once or twice a year.  Routine eye exams. Ask your health care provider how often you should have your eyes checked.  Personal lifestyle choices, including: ? Daily care of your teeth and gums. ? Regular physical activity. ? Eating a healthy diet. ? Avoiding tobacco and drug use. ? Limiting alcohol use. ? Practicing safe sex. ? Taking low-dose aspirin every day. ? Taking vitamin and mineral supplements as recommended by your health care provider. What happens during an annual well check? The services and screenings done by your health care provider during your annual well check will depend on your age, overall health, lifestyle risk factors, and family history of disease. Counseling Your health care provider may ask you questions about your:  Alcohol use.  Tobacco use.  Drug use.  Emotional well-being.  Home and relationship well-being.  Sexual activity.  Eating habits.  History of falls.  Memory and ability to understand (cognition).  Work and work Statistician.  Reproductive health.  Screening You may have the following tests or measurements:  Height, weight, and BMI.  Blood pressure.  Lipid and cholesterol levels. These may be checked every 5 years, or more frequently if you are over 30 years old.  Skin check.  Lung cancer screening. You may have this screening every year starting at age 27 if you have a 30-pack-year history of smoking and currently smoke or have quit within the past 15 years.  Colorectal cancer screening. All adults should have this screening starting at age 33 and continuing until age 46. You will have tests every 1-10 years, depending on your results and the  type of screening test. People at increased risk should start screening at an earlier age. Screening tests may include: ? Guaiac-based fecal occult blood testing. ? Fecal immunochemical test (FIT). ? Stool DNA test. ? Virtual colonoscopy. ? Sigmoidoscopy. During this test, a flexible tube with a tiny camera (sigmoidoscope) is used to examine your rectum and lower colon. The sigmoidoscope is inserted through your anus into your rectum and lower colon. ? Colonoscopy. During this test, a long, thin, flexible tube with a tiny camera (colonoscope) is used to examine your entire colon and rectum.  Hepatitis C blood test.  Hepatitis B blood test.  Sexually transmitted disease (STD) testing.  Diabetes screening. This is done by checking your blood sugar (glucose) after you have not eaten for a while (fasting). You may have this done every 1-3 years.  Bone density scan. This is done to screen for osteoporosis. You may have this done starting at age 37.  Mammogram. This may be done every 1-2 years. Talk to your health care provider about how often you should have regular mammograms. Talk with your health care provider about your test results, treatment options, and if necessary, the need for more tests. Vaccines Your health care provider may recommend certain vaccines, such as:  Influenza vaccine. This is recommended every year.  Tetanus, diphtheria, and acellular pertussis (Tdap, Td) vaccine. You may need a Td booster every 10 years.  Varicella vaccine. You may need this if you have not been vaccinated.  Zoster vaccine. You may need this after age 38.  Measles, mumps, and rubella (MMR) vaccine. You may need at least  one dose of MMR if you were born in 1957 or later. You may also need a second dose.  Pneumococcal 13-valent conjugate (PCV13) vaccine. One dose is recommended after age 24.  Pneumococcal polysaccharide (PPSV23) vaccine. One dose is recommended after age 24.  Meningococcal  vaccine. You may need this if you have certain conditions.  Hepatitis A vaccine. You may need this if you have certain conditions or if you travel or work in places where you may be exposed to hepatitis A.  Hepatitis B vaccine. You may need this if you have certain conditions or if you travel or work in places where you may be exposed to hepatitis B.  Haemophilus influenzae type b (Hib) vaccine. You may need this if you have certain conditions. Talk to your health care provider about which screenings and vaccines you need and how often you need them. This information is not intended to replace advice given to you by your health care provider. Make sure you discuss any questions you have with your health care provider. Document Released: 02/28/2015 Document Revised: 03/24/2017 Document Reviewed: 12/03/2014 Elsevier Interactive Patient Education  2019 [employer].

## 2018-03-02 NOTE — Progress Notes (Signed)
Subjective:   Kerri Ramirez is a 85 y.o. female who presents for Medicare Annual (Subsequent) preventive examination.  Review of Systems:  N/A  Cardiac Risk Factors include: advanced age (>9855men, 85>65 women);dyslipidemia;hypertension;obesity (BMI >30kg/m2)     Objective:     Vitals: BP (!) 134/50 (BP Location: Right Arm)   Pulse 87   Temp 98.1 F (36.7 C) (Oral)   Ht 5\' 4"  (1.626 m)   Wt 158 lb 6.4 oz (71.8 kg)   BMI 27.19 kg/m   Body mass index is 27.19 kg/m.  Advanced Directives 03/02/2018 02/24/2017 02/24/2016 10/29/2014  Does Patient Have a Medical Advance Directive? Yes Yes Yes Yes  Type of Estate agentAdvance Directive Healthcare Power of [place];Living will Healthcare Power of [place]orney;Living will Healthcare Power of [place]orney;Living will Healthcare Power of [place]orney;Living will  Copy of Healthcare Power of Attorney in Chart? Yes - validated most recent copy scanned in chart (See row information) No - copy requested Yes -    Tobacco Social History   Tobacco Use  Smoking Status Former Smoker  . Years: 2.00  . Types: Cigarettes  . Last attempt to quit: 02/16/1944  . Years since quitting: 74.0  Smokeless Tobacco Never Used     Counseling given: Not Answered   Clinical Intake:  Pre-visit preparation completed: Yes  Pain : No/denies pain Pain Score: 0-No pain     Nutritional Status: BMI > 30  Obese Nutritional Risks: None Diabetes: No  How often do you need to have someone help you when you read instructions, pamphlets, or other written materials from your doctor or pharmacy?: 1 - Never  Interpreter Needed?: No  Information entered by :: [facility] HospitalMmarkoski, LPN  Past Medical History:  Diagnosis Date  . Hypertension    Past Surgical History:  Procedure Laterality Date  . ANKLE SURGERY Left 1981  . BACK SURGERY  1987   broken vertebra  . COLONOSCOPY     Dr Mechele CollinElliott  . HEMICOLECTOMY Right 07-25-13   Dr Byrd/diverticulitis  . IVC FILTER PLACEMENT ([facility] HX)   09/01/2013   Family History  Problem Relation Age of Onset  . Heart attack Father   . Heart attack Brother   . Heart attack Brother    Social History   Socioeconomic History  . Marital status: Married    Spouse name: Not on file  . Number of children: 3  . Years of education: Not on file  . Highest education level: 6th grade  Occupational History  . Occupation: retired  Engineer, productionocial Needs  . Financial resource strain: Not hard at all  . Food insecurity:    Worry: Never true    Inability: Never true  . Transportation needs:    Medical: No    Non-medical: No  Tobacco Use  . Smoking status: Former Smoker    Years: 2.00    Types: Cigarettes    Last attempt to quit: 02/16/1944    Years since quitting: 74.0  . Smokeless tobacco: Never Used  Substance and Sexual Activity  . Alcohol use: No  . Drug use: No  . Sexual activity: Not on file  Lifestyle  . Physical activity:    Days per week: 0 days    Minutes per session: 0 min  . Stress: Not at all  Relationships  . Social connections:    Talks on phone: Patient refused    Gets together: Patient refused    Attends religious service: Patient refused    Active member of club or organization:  Patient refused    Attends meetings of clubs or organizations: Patient refused    Relationship status: Patient refused  Other Topics Concern  . Not on file  Social History Narrative   1 son deceased- suicide    Outpatient Encounter Medications as of 03/02/2018  Medication Sig  . lisinopril (PRINIVIL,ZESTRIL) 40 MG tablet TAKE 1 TABLET BY MOUTH ONCE DAILY  . memantine (NAMENDA) 5 MG tablet Take 1 tablet (5 mg total) by mouth 2 (two) times daily.  . Omega-3 Fatty Acids (FISH OIL) 1000 MG CAPS Take 1,000 mg by mouth daily.    No facility-administered encounter medications on file as of 03/02/2018.     Activities of Daily Living In your present state of health, do you have any difficulty performing the following activities: 03/02/2018    Hearing? Y  Comment Does not wear hearing aid.   Vision? N  Difficulty concentrating or making decisions? Y  Comment On namenda 5 mg daily.  Walking or climbing stairs? N  Comment Avoids steps.  Dressing or bathing? N  Doing errands, shopping? Y  Comment Does not drive.   Preparing Food and eating ? N  Comment Husbands cooks.   Using the Toilet? N  In the past six months, have you accidently leaked urine? N  Do you have problems with loss of bowel control? N  Managing your Medications? N  Managing your Finances? Y  Comment Daughter manages finances.   Housekeeping or managing your Housekeeping? Y  Comment Husband cleans.   Some recent data might be hidden    Patient Care Team: Erasmo DownerBacigalupo, Angela M, MD as PCP - General (Family Medicine) Lemar LivingsByrnett, Merrily PewJeffrey W, MD (General Surgery) Oscar LaPayne, Portia E, RN as Case Manager    Assessment:   This is a routine wellness examination for [name].  Exercise Activities and Dietary recommendations Current Exercise Habits: The patient does not participate in regular exercise at present, Exercise limited by: None identified  Goals      Patient Stated   . "I need help with my blood pressure. Its too high" (pt-stated)      Clinical Goals: Patient and/or family/caregiver team will contact CCM team re: any change in level of care needs/desires or other care management/care coordination goals.   Interventions: Discussion with daughter/POA re: patient's needs, dementia care options, level of care options including in home care services, ALF, residential dementia care. Offered CCM care team contact info.        Other   . DIET - INCREASE WATER INTAKE     Recommend to continue drinking 6-8 glasses of water a day. Pt declined changing diet or exercising.     . Increase water intake     Starting 02/24/16, I will increase my water intake to 6 glasses a day.       Fall Risk Fall Risk  03/02/2018 02/24/2017 02/24/2016 10/29/2014  Falls in the past  year? 1 No No No  Number falls in past yr: 1 - - -  Injury with Fall? 0 - - -  Risk for fall due to : Impaired mobility;Impaired balance/gait - - -  Follow up Falls prevention discussed - - -   FALL RISK PREVENTION PERTAINING TO THE HOME:  Any stairs in or around the home WITH handrails? No  Home free of loose throw rugs in walkways, pet beds, electrical cords, etc? Yes  Adequate lighting in your home to reduce risk of falls? Yes   ASSISTIVE DEVICES UTILIZED TO PREVENT FALLS:  Life alert? No  Use of a cane, walker or w/c? Yes  Grab bars in the bathroom? Yes  Shower chair or bench in shower? Yes  Elevated toilet seat or a handicapped toilet? No    TIMED UP AND GO:  Was the test performed? No .     Depression Screen PHQ 2/9 Scores 03/02/2018 02/24/2017 02/24/2016 10/29/2014  PHQ - 2 Score 5 0 0 0  PHQ- 9 Score 17 - - -     Cognitive Function  MMSE - Mini Mental State Exam 01/17/2017  Orientation to time 3  Orientation to Place 5  Registration 3  Attention/ Calculation 4  Recall 1  Language- name 2 objects 2  Language- repeat 0  Language- follow 3 step command 2  Language- read & follow direction 1  Write a sentence 1  Copy design 0  Total score 22     6CIT Screen 03/02/2018 02/24/2016  What Year? 4 points 0 points  What month? 3 points 0 points  What time? 3 points 0 points  Count back from 20 0 points 2 points  Months in reverse 2 points 0 points  Repeat phrase 10 points 6 points  Total Score 22 8    Immunization History  Administered Date(s) Administered  . Influenza Split 11/10/2011  . Influenza, High Dose Seasonal PF 12/03/2013, 10/29/2014, 11/28/2015, 12/01/2016, 11/01/2017  . Influenza,inj,Quad PF,6+ Mos 12/06/2012  . Influenza-Unspecified 11/10/2011, 12/06/2012, 12/03/2013, 10/29/2014  . Pneumococcal Conjugate-13 12/03/2013    Qualifies for Shingles Vaccine? Yes . Due for Shingrix. Education has been provided regarding the importance of this vaccine.  Pt has been advised to call insurance company to determine out of pocket expense. Advised may also receive vaccine at local pharmacy or Health Dept. Verbalized acceptance and understanding.  Tdap: Although this vaccine is not a covered service during a Wellness Exam, does the patient still wish to receive this vaccine today?  No .  Education has been provided regarding the importance of this vaccine. Advised may receive this vaccine at local pharmacy or Health Dept. Aware to provide a copy of the vaccination record if obtained from local pharmacy or Health Dept. Verbalized acceptance and understanding.  Flu Vaccine: Up to date  Pneumococcal Vaccine: Due for Pneumococcal vaccine. Does the patient want to receive this vaccine today?  No . Education has been provided regarding the importance of this vaccine but still declined. Advised may receive this vaccine at local pharmacy or Health Dept. Aware to provide a copy of the vaccination record if obtained from local pharmacy or Health Dept. Verbalized acceptance and understanding.   Screening Tests Health Maintenance  Topic Date Due  . PNA vac Low Risk Adult (2 of 2 - PPSV23) 12/04/2014  . DEXA SCAN  11/10/2016  . TETANUS/TDAP  11/02/2018 (Originally 07/08/1944)  . INFLUENZA VACCINE  Completed    Cancer Screenings:  Colorectal Screening: No longer required.   Mammogram: No longer required.   Bone Density: Completed 11/11/14. Results reflect OSTEOPOROSIS. Repeat every 2 years. Ordered today. Pt aware the office will call re: appt.  Lung Cancer Screening: (Low Dose CT Chest recommended if Age 65-80 years, 30 pack-year currently smoking OR have quit w/in 15years.) does not qualify.    Additional Screening:  Vision Screening: Recommended annual ophthalmology exams for early detection of glaucoma and other disorders of the eye.  Dental Screening: Recommended annual dental exams for proper oral hygiene  Community Resource Referral:  CRR  required this visit?  No  Plan:  I have personally reviewed and addressed the Medicare Annual Wellness questionnaire and have noted the following in the patient's chart:  A. Medical and social history B. Use of alcohol, tobacco or illicit drugs  C. Current medications and supplements D. Functional ability and status E.  Nutritional status F.  Physical activity G. Advance directives H. List of other physicians I.  Hospitalizations, surgeries, and ER visits in previous 12 months J.  Vitals K. Screenings such as hearing and vision if needed, cognitive and depression L. Referrals and appointments - none  In addition, I have reviewed and discussed with patient certain preventive protocols, quality metrics, and best practice recommendations. A written personalized care plan for preventive services as well as general preventive health recommendations were provided to patient.  See attached scanned questionnaire for additional information.   Signed,  Hyacinth Meeker, LPN Nurse Health Advisor   Nurse Recommendations: Pt declined the tetanus and Pneumovax 23 vaccines today.

## 2018-03-02 NOTE — Assessment & Plan Note (Signed)
Well controlled Now taking medication with good compliance

## 2018-03-02 NOTE — Patient Instructions (Signed)
[name] , Thank you for taking time to come for your Medicare Wellness Visit. I appreciate your ongoing commitment to your health goals. Please review the following plan we discussed and let me know if I can assist you in the future.   Screening recommendations/referrals: Colonoscopy: No longer required.  Mammogram: No longer required.  Bone Density: Ordered today; pt aware office will contact to set up apt. Recommended yearly ophthalmology/optometry visit for glaucoma screening and checkup Recommended yearly dental visit for hygiene and checkup  Vaccinations: Influenza vaccine: Up to date Pneumococcal vaccine: Pt declines Pneumovax 23 today.  Tdap vaccine: Pt declines today.  Shingles vaccine: Pt declines today.     Advanced directives: Currently on file.   Conditions/risks identified: Continue trying to increase water intake to 6-8 8 oz glasses a day.   Next appointment: 10:00 AM today with Dr Beryle Flock.    Preventive Care 83 Years and Older, Female Preventive care refers to lifestyle choices and visits with your health care provider that can promote health and wellness. What does preventive care include?  A yearly physical exam. This is also called an annual well check.  Dental exams once or twice a year.  Routine eye exams. Ask your health care provider how often you should have your eyes checked.  Personal lifestyle choices, including:  Daily care of your teeth and gums.  Regular physical activity.  Eating a healthy diet.  Avoiding tobacco and drug use.  Limiting alcohol use.  Practicing safe sex.  Taking low-dose aspirin every day.  Taking vitamin and mineral supplements as recommended by your health care provider. What happens during an annual well check? The services and screenings done by your health care provider during your annual well check will depend on your age, overall health, lifestyle risk factors, and family history of disease. Counseling    Your health care provider may ask you questions about your:  Alcohol use.  Tobacco use.  Drug use.  Emotional well-being.  Home and relationship well-being.  Sexual activity.  Eating habits.  History of falls.  Memory and ability to understand (cognition).  Work and work Astronomer.  Reproductive health. Screening  You may have the following tests or measurements:  Height, weight, and BMI.  Blood pressure.  Lipid and cholesterol levels. These may be checked every 5 years, or more frequently if you are over 67 years old.  Skin check.  Lung cancer screening. You may have this screening every year starting at age 32 if you have a 30-pack-year history of smoking and currently smoke or have quit within the past 15 years.  Fecal occult blood test (FOBT) of the stool. You may have this test every year starting at age 32.  Flexible sigmoidoscopy or colonoscopy. You may have a sigmoidoscopy every 5 years or a colonoscopy every 10 years starting at age 59.  Hepatitis C blood test.  Hepatitis B blood test.  Sexually transmitted disease (STD) testing.  Diabetes screening. This is done by checking your blood sugar (glucose) after you have not eaten for a while (fasting). You may have this done every 1-3 years.  Bone density scan. This is done to screen for osteoporosis. You may have this done starting at age 26.  Mammogram. This may be done every 1-2 years. Talk to your health care provider about how often you should have regular mammograms. Talk with your health care provider about your test results, treatment options, and if necessary, the need for more tests. Vaccines  Your  health care provider may recommend certain vaccines, such as:  Influenza vaccine. This is recommended every year.  Tetanus, diphtheria, and acellular pertussis (Tdap, Td) vaccine. You may need a Td booster every 10 years.  Zoster vaccine. You may need this after age 40.  Pneumococcal 13-valent  conjugate (PCV13) vaccine. One dose is recommended after age 41.  Pneumococcal polysaccharide (PPSV23) vaccine. One dose is recommended after age 13. Talk to your health care provider about which screenings and vaccines you need and how often you need them. This information is not intended to replace advice given to you by your health care provider. Make sure you discuss any questions you have with your health care provider. Document Released: 02/28/2015 Document Revised: 10/22/2015 Document Reviewed: 12/03/2014 Elsevier Interactive Patient Education  2017 [place] Prevention in the Home Falls can cause injuries. They can happen to people of all ages. There are many things you can do to make your home safe and to help prevent falls. What can I do on the outside of my home?  Regularly fix the edges of walkways and driveways and fix any cracks.  Remove anything that might make you trip as you walk through a door, such as a raised step or threshold.  Trim any bushes or trees on the path to your home.  Use bright outdoor lighting.  Clear any walking paths of anything that might make someone trip, such as rocks or tools.  Regularly check to see if handrails are loose or broken. Make sure that both sides of any steps have handrails.  Any raised decks and porches should have guardrails on the edges.  Have any leaves, snow, or ice cleared regularly.  Use sand or salt on walking paths during winter.  Clean up any spills in your garage right away. This includes oil or grease spills. What can I do in the bathroom?  Use night lights.  Install grab bars by the toilet and in the tub and shower. Do not use towel bars as grab bars.  Use non-skid mats or decals in the tub or shower.  If you need to sit down in the shower, use a plastic, non-slip stool.  Keep the floor dry. Clean up any water that spills on the floor as soon as it happens.  Remove soap buildup in the tub or  shower regularly.  Attach bath mats securely with double-sided non-slip rug tape.  Do not have throw rugs and other things on the floor that can make you trip. What can I do in the bedroom?  Use night lights.  Make sure that you have a light by your bed that is easy to reach.  Do not use any sheets or blankets that are too big for your bed. They should not hang down onto the floor.  Have a firm chair that has side arms. You can use this for support while you get dressed.  Do not have throw rugs and other things on the floor that can make you trip. What can I do in the kitchen?  Clean up any spills right away.  Avoid walking on wet floors.  Keep items that you use a lot in easy-to-reach places.  If you need to reach something above you, use a strong step stool that has a grab bar.  Keep electrical cords out of the way.  Do not use floor polish or wax that makes floors slippery. If you must use wax, use non-skid floor wax.  Do not  have throw rugs and other things on the floor that can make you trip. What can I do with my stairs?  Do not leave any items on the stairs.  Make sure that there are handrails on both sides of the stairs and use them. Fix handrails that are broken or loose. Make sure that handrails are as long as the stairways.  Check any carpeting to make sure that it is firmly attached to the stairs. Fix any carpet that is loose or worn.  Avoid having throw rugs at the top or bottom of the stairs. If you do have throw rugs, attach them to the floor with carpet tape.  Make sure that you have a light switch at the top of the stairs and the bottom of the stairs. If you do not have them, ask someone to add them for you. What else can I do to help prevent falls?  Wear shoes that:  Do not have high heels.  Have rubber bottoms.  Are comfortable and fit you well.  Are closed at the toe. Do not wear sandals.  If you use a stepladder:  Make sure that it is fully  opened. Do not climb a closed stepladder.  Make sure that both sides of the stepladder are locked into place.  Ask someone to hold it for you, if possible.  Clearly mark and make sure that you can see:  Any grab bars or handrails.  First and last steps.  Where the edge of each step is.  Use tools that help you move around (mobility aids) if they are needed. These include:  Canes.  Walkers.  Scooters.  Crutches.  Turn on the lights when you go into a dark area. Replace any light bulbs as soon as they burn out.  Set up your furniture so you have a clear path. Avoid moving your furniture around.  If any of your floors are uneven, fix them.  If there are any pets around you, be aware of where they are.  Review your medicines with your doctor. Some medicines can make you feel dizzy. This can increase your chance of falling. Ask your doctor what other things that you can do to help prevent falls. This information is not intended to replace advice given to you by your health care provider. Make sure you discuss any questions you have with your health care provider. Document Released: 11/28/2008 Document Revised: 07/10/2015 Document Reviewed: 03/08/2014 Elsevier Interactive Patient Education  2017 [employer].

## 2018-03-03 DIAGNOSIS — E785 Hyperlipidemia, unspecified: Secondary | ICD-10-CM | POA: Diagnosis not present

## 2018-03-03 DIAGNOSIS — F0390 Unspecified dementia without behavioral disturbance: Secondary | ICD-10-CM | POA: Diagnosis not present

## 2018-03-03 DIAGNOSIS — Z9114 Patient's other noncompliance with medication regimen: Secondary | ICD-10-CM | POA: Diagnosis not present

## 2018-03-03 DIAGNOSIS — I10 Essential (primary) hypertension: Secondary | ICD-10-CM | POA: Diagnosis not present

## 2018-03-03 NOTE — Addendum Note (Signed)
Addended by: Erasmo Downer on: 03/03/2018 03:46 PM   Modules accepted: Level of Service

## 2018-03-08 DIAGNOSIS — I10 Essential (primary) hypertension: Secondary | ICD-10-CM | POA: Diagnosis not present

## 2018-03-08 DIAGNOSIS — F0390 Unspecified dementia without behavioral disturbance: Secondary | ICD-10-CM | POA: Diagnosis not present

## 2018-03-08 DIAGNOSIS — E785 Hyperlipidemia, unspecified: Secondary | ICD-10-CM | POA: Diagnosis not present

## 2018-03-08 DIAGNOSIS — Z9114 Patient's other noncompliance with medication regimen: Secondary | ICD-10-CM | POA: Diagnosis not present

## 2018-03-14 ENCOUNTER — Telehealth: Payer: Self-pay | Admitting: Family Medicine

## 2018-03-14 NOTE — Telephone Encounter (Signed)
There is nothing else similar.  She did not tolerate Aricept well, so namenda is the only option. Sorry.

## 2018-03-14 NOTE — Telephone Encounter (Signed)
Patient's daughter states that the patient prescription for memantine (NAMENDA) 5 MG tablet is going to be $125.79 this time for a 3 month supply.   She is wanting to know if there is a medication comparable to this that is cheaper.  She states if there is not it will be okay.  She uses [facility] [address].

## 2018-03-14 NOTE — Telephone Encounter (Signed)
Patient's daughter Deborah advised.  

## 2018-03-15 DIAGNOSIS — I10 Essential (primary) hypertension: Secondary | ICD-10-CM | POA: Diagnosis not present

## 2018-03-15 DIAGNOSIS — E785 Hyperlipidemia, unspecified: Secondary | ICD-10-CM | POA: Diagnosis not present

## 2018-03-15 DIAGNOSIS — F0390 Unspecified dementia without behavioral disturbance: Secondary | ICD-10-CM | POA: Diagnosis not present

## 2018-03-15 DIAGNOSIS — Z9114 Patient's other noncompliance with medication regimen: Secondary | ICD-10-CM | POA: Diagnosis not present

## 2018-03-22 DIAGNOSIS — I10 Essential (primary) hypertension: Secondary | ICD-10-CM | POA: Diagnosis not present

## 2018-03-22 DIAGNOSIS — F0390 Unspecified dementia without behavioral disturbance: Secondary | ICD-10-CM | POA: Diagnosis not present

## 2018-03-22 DIAGNOSIS — E785 Hyperlipidemia, unspecified: Secondary | ICD-10-CM | POA: Diagnosis not present

## 2018-03-22 DIAGNOSIS — Z9114 Patient's other noncompliance with medication regimen: Secondary | ICD-10-CM | POA: Diagnosis not present

## 2018-03-27 DIAGNOSIS — F0390 Unspecified dementia without behavioral disturbance: Secondary | ICD-10-CM | POA: Diagnosis not present

## 2018-03-27 DIAGNOSIS — I10 Essential (primary) hypertension: Secondary | ICD-10-CM | POA: Diagnosis not present

## 2018-03-27 DIAGNOSIS — E785 Hyperlipidemia, unspecified: Secondary | ICD-10-CM | POA: Diagnosis not present

## 2018-03-27 DIAGNOSIS — Z9114 Patient's other noncompliance with medication regimen: Secondary | ICD-10-CM | POA: Diagnosis not present

## 2018-04-24 ENCOUNTER — Ambulatory Visit
Admission: RE | Admit: 2018-04-24 | Discharge: 2018-04-24 | Disposition: A | Discharge status: Home / Self Care | Payer: Medicare Other | Source: Ambulatory Visit | Attending: Family Medicine | Admitting: Family Medicine

## 2018-04-24 DIAGNOSIS — M810 Age-related osteoporosis without current pathological fracture: Secondary | ICD-10-CM

## 2018-07-21 ENCOUNTER — Telehealth: Payer: Self-pay

## 2018-07-21 NOTE — Telephone Encounter (Signed)
Patient's daughter called saying that she does not feel that the patient needs to take Namenda any more. She reports that her dementia has gotten worse. She also mentions that Namenda has gone up to $112. She wants to know can they discontinue the medication?  She also states that the patient is forgetting to take her medications. She is forgetting to eat/fix meals and she is very stubborn when her husband (who is also 70) tries to help. She reports that she lives in [place] and visits twice a week. Over the last few months, she has noticed that her Dementia has progressively gotten worse.   She does have a F/U appt with you next month, and feels that some of these issues can wait to be discussed at that time. She just wanted you to be aware of the status of the patient.

## 2018-07-24 NOTE — Telephone Encounter (Signed)
Patient's daughter advised. She states patient will not listen to anyone about medications.

## 2018-07-24 NOTE — Telephone Encounter (Signed)
I understand why they'd like to d/c this medication and I am ok with that.  It seems that dementia is getting worse, as expected. All Namenda can do is slow that process.  I am happy to see them and discuss this further.  She may benefit from CCM to help check on her and provide any resources.  Offer this to her daughter please

## 2018-09-01 ENCOUNTER — Other Ambulatory Visit: Payer: Self-pay

## 2018-09-01 ENCOUNTER — Ambulatory Visit
Admission: RE | Admit: 2018-09-01 | Discharge: 2018-09-01 | Disposition: A | Discharge status: Home / Self Care | Payer: Medicare Other | Source: Ambulatory Visit | Attending: Family Medicine | Admitting: Family Medicine

## 2018-09-01 ENCOUNTER — Encounter: Payer: Self-pay | Admitting: Family Medicine

## 2018-09-01 ENCOUNTER — Telehealth: Payer: Self-pay

## 2018-09-01 ENCOUNTER — Ambulatory Visit (INDEPENDENT_AMBULATORY_CARE_PROVIDER_SITE_OTHER): Payer: Medicare Other | Admitting: Family Medicine

## 2018-09-01 VITALS — BP 116/40 | HR 94 | Temp 97.8°F | Resp 20 | Wt 147.0 lb

## 2018-09-01 DIAGNOSIS — E785 Hyperlipidemia, unspecified: Secondary | ICD-10-CM

## 2018-09-01 DIAGNOSIS — M7989 Other specified soft tissue disorders: Secondary | ICD-10-CM

## 2018-09-01 DIAGNOSIS — I82402 Acute embolism and thrombosis of unspecified deep veins of left lower extremity: Secondary | ICD-10-CM

## 2018-09-01 DIAGNOSIS — Z862 Personal history of diseases of the blood and blood-forming organs and certain disorders involving the immune mechanism: Secondary | ICD-10-CM | POA: Diagnosis not present

## 2018-09-01 DIAGNOSIS — I10 Essential (primary) hypertension: Secondary | ICD-10-CM

## 2018-09-01 DIAGNOSIS — Z86718 Personal history of other venous thrombosis and embolism: Secondary | ICD-10-CM

## 2018-09-01 DIAGNOSIS — D692 Other nonthrombocytopenic purpura: Secondary | ICD-10-CM

## 2018-09-01 DIAGNOSIS — R7303 Prediabetes: Secondary | ICD-10-CM

## 2018-09-01 DIAGNOSIS — I82412 Acute embolism and thrombosis of left femoral vein: Secondary | ICD-10-CM | POA: Diagnosis not present

## 2018-09-01 MED ORDER — LISINOPRIL 20 MG PO TABS: 20.0000 mg | ORAL_TABLET | Freq: Every day | ORAL | 5 refills | Status: DC

## 2018-09-01 MED ORDER — ELIQUIS 5 MG VTE STARTER PACK: ORAL_TABLET | ORAL | 0 refills | Status: DC

## 2018-09-01 NOTE — Assessment & Plan Note (Signed)
Not currently on a statin Recheck lipid panel Given age, would not consider statin for primary prevention

## 2018-09-01 NOTE — Telephone Encounter (Signed)
-----   Message from [name], MD sent at 09/01/2018  3:20 PM EDT ----- Regarding: DVT Kerri Ramirez has a DVT of the left leg.  This is a blood clot and if left untreated, could lead to blood clot of lungs.  Needs to start Eliquis 10mg  BID x7 days and then 5mg  BID for at least next 3-6 months.  Important to elevated leg.  Please make f/u appt in 3 months. Will send Rx to pharmacy

## 2018-09-01 NOTE — Assessment & Plan Note (Signed)
Chronic and stable.   

## 2018-09-01 NOTE — Telephone Encounter (Signed)
Unfortunately that is the only way given her other medical conditions.

## 2018-09-01 NOTE — Progress Notes (Signed)
Patient: Kerri Ramirez Female    DOB: [birth year]   85 y.o.   MRN: [id number] Visit Date: 09/01/2018  Today's Provider: Lavon Paganini, MD   Chief Complaint  Patient presents with  . Hypertension  . Hyperlipidemia  . Leg Swelling   Subjective:    HPI  Hypertension, follow-up:  BP Readings from Last 3 Encounters:  09/01/18 (!) 116/40  03/02/18 (!) 134/50  03/02/18 (!) 134/50    She was last seen for hypertension 6 months ago.  BP at that visit was 134/50. Management changes since that visit include . She reports good compliance with treatment. She is not having side effects.  She is not exercising She is adherent to low salt diet.   Outside blood pressures are not being checked. She is experiencing lower extremity edema.  Patient denies chest pain, chest pressure/discomfort, claudication, dyspnea, exertional chest pressure/discomfort, fatigue, irregular heart beat, near-syncope, orthopnea, palpitations, paroxysmal nocturnal dyspnea, syncope and tachypnea.   Cardiovascular risk factors include advanced age (older than 61 for men, 48 for women) and hypertension.  Use of agents associated with hypertension: none.     Weight trend: stable Wt Readings from Last 3 Encounters:  09/01/18 147 lb (66.7 kg)  03/02/18 158 lb 6.4 oz (71.8 kg)  03/02/18 158 lb 6.4 oz (71.8 kg)    Current diet: patient daughter states that she eats take out daily  ------------------------------------------------------------------------  Lipid/Cholesterol, Follow-up:   Last seen for this6 months ago.  Management changes since that visit include . Marland Kitchen Last Lipid Panel:    Component Value Date/Time   CHOL 189 05/30/2017 1031   TRIG 136 05/30/2017 1031   HDL 54 05/30/2017 1031   CHOLHDL 3.5 05/30/2017 1031   LDLCALC 108 (H) 05/30/2017 1031    Risk factors for vascular disease include hypercholesterolemia and hypertension   Current symptoms include none and have been  unchanged. Weight trend: stable Prior visit with dietician: no Current diet: not asked Current exercise: none  Wt Readings from Last 3 Encounters:  09/01/18 147 lb (66.7 kg)  03/02/18 158 lb 6.4 oz (71.8 kg)  03/02/18 158 lb 6.4 oz (71.8 kg)    -------------------------------------------------------------------  LLE always seems to swell more than RLE.  Has h/o DVT in LLE in 2015 after hemicolectomy with IVC filter placement.  She is relatively sedentary at baseline and has not been leaving the house due to [place] pandemic.  This morning, her husband noted that her leg was much more swollen than usual.  It typically goes down over night.  Denies any pain, SOB, fever, rash, injury.   Allergies  Allergen Reactions  . Codeine Other (See Comments)    Made head spin      Current Outpatient Medications:  .  lisinopril (PRINIVIL,ZESTRIL) 40 MG tablet, TAKE 1 TABLET BY MOUTH ONCE DAILY, Disp: 90 tablet, Rfl: 3 .  memantine (NAMENDA) 5 MG tablet, Take 1 tablet (5 mg total) by mouth 2 (two) times daily., Disp: 60 tablet, Rfl: 5 .  Omega-3 Fatty Acids (FISH OIL) 1000 MG CAPS, Take 1,000 mg by mouth daily. , Disp: , Rfl:   Review of Systems  Constitutional: Negative.   Respiratory: Negative.   Cardiovascular: Positive for leg swelling.  Genitourinary: Negative.   Neurological: Negative.     Social History   Tobacco Use  . Smoking status: Former Smoker    Years: 2.00    Types: Cigarettes    Quit date: 02/16/1944    Years  since quitting: 74.5  . Smokeless tobacco: Never Used  Substance Use Topics  . Alcohol use: No      Objective:   BP (!) 116/40   Pulse 94   Temp 97.8 F (36.6 C) (Oral)   Resp 20   Wt 147 lb (66.7 kg)   SpO2 98%   BMI 25.23 kg/m  Vitals:   09/01/18 0951  BP: (!) 116/40  Pulse: 94  Resp: 20  Temp: 97.8 F (36.6 C)  TempSrc: Oral  SpO2: 98%  Weight: 147 lb (66.7 kg)     Physical Exam Vitals signs reviewed.  Constitutional:      Appearance:  Normal appearance.  HENT:     Head: Normocephalic and atraumatic.     Right Ear: External ear normal.     Left Ear: External ear normal.  Eyes:     General: No scleral icterus.    Conjunctiva/sclera: Conjunctivae normal.  Neck:     Musculoskeletal: Neck supple.  Cardiovascular:     Rate and Rhythm: Normal rate and regular rhythm.     Heart sounds: Normal heart sounds.  Pulmonary:     Effort: Pulmonary effort is normal. No respiratory distress.     Breath sounds: No wheezing or rhonchi.  Musculoskeletal:     Comments: RLE with trace edema, LLE with 3+ edema. Calf diameter is approx twice the RLE. No calf tenderness. Negative Homans.  Lymphadenopathy:     Cervical: No cervical adenopathy.  Skin:    General: Skin is warm and dry.     Capillary Refill: Capillary refill takes less than 2 seconds.     Findings: No rash.  Neurological:     Mental Status: She is alert. Mental status is at baseline.  Psychiatric:        Mood and Affect: Mood normal.        Behavior: Behavior normal.      No results found for any visits on 09/01/18.     Assessment & Plan   [employer],Kathleen J Ramirez,acting as a Neurosurgeonscribe for Shirlee LatchAngela , MD.,have documented all relevant documentation on the behalf of Shirlee Latchngela , MD,as directed by  Shirlee LatchAngela , MD while in the presence of Shirlee LatchAngela , MD.   Problem List Items Addressed This Visit      Cardiovascular and Mediastinum   Essential hypertension    Hypotension today Asymptomatic Decrease lisinopril to 20mg  daily Call if symptomatic Watch home BP  F/u in 1 month      Relevant Medications   lisinopril (ZESTRIL) 20 MG tablet   Eliquis DVT/PE Starter Pack (ELIQUIS STARTER PACK) 5 MG TABS   Other Relevant Orders   Comprehensive metabolic panel   Senile purpura (HCC)    Chronic and stable      Relevant Medications   lisinopril (ZESTRIL) 20 MG tablet   Eliquis DVT/PE Starter Pack (ELIQUIS STARTER PACK) 5 MG TABS     Other    History of DVT of lower extremity   Relevant Orders   US Venous Img Lower Unilateral Left (Completed)   Prediabetes    Not currently on medications Recheck A1c      Relevant Orders   Hemoglobin A1c   HLD (hyperlipidemia)    Not currently on a statin Recheck lipid panel Given age, would not consider statin for primary prevention      Relevant Medications   lisinopril (ZESTRIL) 20 MG tablet   Eliquis DVT/PE Starter Pack (ELIQUIS STARTER PACK) 5 MG TABS   Other Relevant Orders  Comprehensive metabolic panel   Lipid panel   History of iron deficiency anemia    Recheck CBC, especially in setting of possibly starting anticoagulation      Relevant Orders   CBC w/Diff/Platelet    Other Visit Diagnoses    Left leg swelling    -  Primary   Relevant Orders   US Venous Img Lower Unilateral Left (Completed)    -New problem just noted this morning -Patient does have a history of DVT in the left leg - High suspicion for recurrent DVT today - Patient very hesitant to go get an ultrasound, but I did convince her to go get a stat venous ultrasound -Discussed with patient and her daughter that she may likely need to start on Eliquis pending results   Return in about 6 months (around 03/04/2019) for CPE/AWV.   The entirety of the information documented in the History of Present Illness, Review of Systems and Physical Exam were personally obtained by me. Portions of this information were initially documented by Kerri Ramirez, CMA and reviewed by me for thoroughness and accuracy.    , Marzella SchleinAngela M, MD MPH [facility]

## 2018-09-01 NOTE — Assessment & Plan Note (Signed)
Hypotension today Asymptomatic Decrease lisinopril to 20mg  daily Call if symptomatic Watch home BP  F/u in 1 month

## 2018-09-01 NOTE — Assessment & Plan Note (Signed)
Recheck CBC, especially in setting of possibly starting anticoagulation

## 2018-09-01 NOTE — Telephone Encounter (Signed)
Advised patient's daughter Neoma Laming) and she states that the pt may not remember to take the medication BID but she going try to stress how import it is to take the medication for her blood clot. FYI

## 2018-09-01 NOTE — Assessment & Plan Note (Signed)
Not currently on medications Recheck A1c

## 2018-09-04 ENCOUNTER — Telehealth: Payer: Self-pay

## 2018-09-04 LAB — CBC WITH DIFFERENTIAL/PLATELET
Basophils Absolute: 0 10*3/uL (ref 0.0–0.2)
Basos: 0 %
EOS (ABSOLUTE): 0.1 10*3/uL (ref 0.0–0.4)
Eos: 1 %
Hematocrit: 27.3 % — ABNORMAL LOW (ref 34.0–46.6)
Hemoglobin: 8.8 g/dL — ABNORMAL LOW (ref 11.1–15.9)
Immature Grans (Abs): 0 10*3/uL (ref 0.0–0.1)
Immature Granulocytes: 0 %
Lymphocytes Absolute: 0.9 10*3/uL (ref 0.7–3.1)
Lymphs: 11 %
MCH: 31.3 pg (ref 26.6–33.0)
MCHC: 32.2 g/dL (ref 31.5–35.7)
MCV: 97 fL (ref 79–97)
Monocytes Absolute: 0.5 10*3/uL (ref 0.1–0.9)
Monocytes: 6 %
Neutrophils Absolute: 6.4 10*3/uL (ref 1.4–7.0)
Neutrophils: 82 %
Platelets: 307 10*3/uL (ref 150–450)
RBC: 2.81 x10E6/uL — ABNORMAL LOW (ref 3.77–5.28)
RDW: 13.4 % (ref 11.7–15.4)
WBC: 7.9 10*3/uL (ref 3.4–10.8)

## 2018-09-04 LAB — COMPREHENSIVE METABOLIC PANEL
ALT: 10 IU/L (ref 0–32)
AST: 15 IU/L (ref 0–40)
Albumin/Globulin Ratio: 1.9 (ref 1.2–2.2)
Albumin: 4 g/dL (ref 3.5–4.6)
Alkaline Phosphatase: 74 IU/L (ref 39–117)
BUN/Creatinine Ratio: 27 (ref 12–28)
BUN: 38 mg/dL — ABNORMAL HIGH (ref 10–36)
Bilirubin Total: 0.4 mg/dL (ref 0.0–1.2)
CO2: 13 mmol/L — ABNORMAL LOW (ref 20–29)
Calcium: 9.7 mg/dL (ref 8.7–10.3)
Chloride: 117 mmol/L (ref 96–106)
Creatinine, Ser: 1.43 mg/dL — ABNORMAL HIGH (ref 0.57–1.00)
GFR calc Af Amer: 37 mL/min/{1.73_m2} — ABNORMAL LOW (ref >59)
GFR calc non Af Amer: 32 mL/min/{1.73_m2} — ABNORMAL LOW (ref >59)
Globulin, Total: 2.1 g/dL (ref 1.5–4.5)
Glucose: 85 mg/dL (ref 65–99)
Potassium: 5.3 mmol/L — ABNORMAL HIGH (ref 3.5–5.2)
Sodium: 141 mmol/L (ref 134–144)
Total Protein: 6.1 g/dL (ref 6.0–8.5)

## 2018-09-04 LAB — LIPID PANEL
Chol/HDL Ratio: 2.7 ratio (ref 0.0–4.4)
Cholesterol, Total: 140 mg/dL (ref 100–199)
HDL: 51 mg/dL (ref >39)
LDL Calculated: 70 mg/dL (ref 0–99)
Triglycerides: 94 mg/dL (ref 0–149)
VLDL Cholesterol Cal: 19 mg/dL (ref 5–40)

## 2018-09-04 LAB — HEMOGLOBIN A1C
Est. average glucose Bld gHb Est-mCnc: 94 mg/dL
Hgb A1c MFr Bld: 4.9 % (ref 4.8–5.6)

## 2018-09-04 NOTE — Telephone Encounter (Signed)
Patient daughter was advised.

## 2018-09-04 NOTE — Telephone Encounter (Signed)
-----   Message from [name], MD sent at 09/01/2018  4:07 PM EDT ----- Kerri Ramirez has a DVT of the left leg.  This is a blood clot and if left untreated, could lead to blood clot of lungs.  Needs to start Eliquis 10mg  BID x7 days and then 5mg  BID for at least next 3-6 months.  Important to elevated leg.  Please make f/u appt in 3 months. Will send Rx to pharmacy

## 2018-09-06 ENCOUNTER — Telehealth: Payer: Self-pay

## 2018-09-06 NOTE — Telephone Encounter (Signed)
Patient's daughter Neoma Laming) was advised and states that she will make sure patient returns to get labs recheck.

## 2018-09-06 NOTE — Telephone Encounter (Signed)
-----   Message from [name], MD sent at 09/05/2018  9:53 AM EDT ----- Kidney function is worsening and potassium is high.  Stop and potassium supplement if she is taking it.  Avoid high potassium foods, like citrus and bananas.  Stay hydrated.  Avoid NSAIDs. A1c is normal.  She is also now anemic.  Any bleeding noted anywhere?  Needs to repeat labs - Renal function panel, CBC, iron panel, B12, folate - in 2 weeks.

## 2018-09-08 ENCOUNTER — Telehealth: Payer: Self-pay | Admitting: Family Medicine

## 2018-09-08 NOTE — Telephone Encounter (Signed)
Pt's daughter stated that she spoke with someone earlier this week wanting to know how much potassium pt is taking that is included in her multivitamin. Pt's daughter stated 80 mg of potassium is in pt's multivitamin and wanted to know if pt should increase potassium. Please advise. Thanks TNP

## 2018-09-12 NOTE — Telephone Encounter (Signed)
She is actually on too much potassium.  Her potassium was high when it was checked on 7/17.  Please hold multivitamin and recheck BMP in 2 weeks.

## 2018-09-12 NOTE — Telephone Encounter (Signed)
Patient daughter Neoma Laming) was advised and states that she will remove the multivitamin when she visit patient on Thursday, 09/14/2018.FYI

## 2018-09-27 ENCOUNTER — Telehealth: Payer: Self-pay | Admitting: Family Medicine

## 2018-09-27 NOTE — Telephone Encounter (Signed)
Pt's Daughter calling regarding pt's medication for preventing blood clots - not sure of the name.  Thinks it may have been Eliquis DVT/PE Starter Pack (ELIQUIS STARTER PACK) 5 MG TABS.  Pt is almost out and wants to know if she needs a refill or needing to continue?  Please call Earnest Bailey back at [phone]  Thanks, [facility]

## 2018-09-29 ENCOUNTER — Other Ambulatory Visit: Payer: Self-pay | Admitting: Family Medicine

## 2018-09-29 MED ORDER — APIXABAN 5 MG PO TABS: 5.0000 mg | ORAL_TABLET | Freq: Two times a day (BID) | ORAL | 2 refills | Status: DC

## 2018-09-29 NOTE — Telephone Encounter (Signed)
Patient's daughter Deborah advised.  

## 2018-09-29 NOTE — Telephone Encounter (Signed)
She will continue on Eliquis 5mg  BID for at least 3-6 months.  New Rx sent

## 2018-10-02 ENCOUNTER — Ambulatory Visit (INDEPENDENT_AMBULATORY_CARE_PROVIDER_SITE_OTHER): Payer: Medicare Other | Admitting: Family Medicine

## 2018-10-02 ENCOUNTER — Encounter: Payer: Self-pay | Admitting: Family Medicine

## 2018-10-02 VITALS — BP 130/65 | HR 68 | Temp 96.8°F | Resp 16 | Ht 64.0 in | Wt 144.0 lb

## 2018-10-02 DIAGNOSIS — F0390 Unspecified dementia without behavioral disturbance: Secondary | ICD-10-CM

## 2018-10-02 DIAGNOSIS — D649 Anemia, unspecified: Secondary | ICD-10-CM

## 2018-10-02 DIAGNOSIS — I82402 Acute embolism and thrombosis of unspecified deep veins of left lower extremity: Secondary | ICD-10-CM | POA: Diagnosis not present

## 2018-10-02 DIAGNOSIS — E875 Hyperkalemia: Secondary | ICD-10-CM | POA: Diagnosis not present

## 2018-10-02 DIAGNOSIS — I10 Essential (primary) hypertension: Secondary | ICD-10-CM

## 2018-10-02 NOTE — Assessment & Plan Note (Signed)
Well controlled Continue current medications Recheck metabolic panel F/u in 6 months  

## 2018-10-02 NOTE — Progress Notes (Signed)
Patient: Kerri Ramirez Female    DOB: 11-15-1925   85 y.o.   MRN: [id number] Visit Date: 10/02/2018  Today's Kerri Ramirez: Kerri Paganini, MD   Chief Complaint  Patient presents with  . Follow-up  . Hypertension   Subjective:     HPI    Hypertension, follow-up:  BP Readings from Last 3 Encounters:  10/02/18 130/65  09/01/18 (!) 116/40  03/02/18 (!) 134/50    She was last seen for hypertension 1 months ago.  BP at that visit was 116/40. Management since that visit includes; decreased lisinopril to 20 mg qd.She reports good compliance with treatment. She is not having side effects. none She is not exercising. She is adherent to low salt diet.   Outside blood pressures are not checking. She is experiencing none.  Patient denies none.   Cardiovascular risk factors include advanced age (older than 58 for men, 57 for women).  Use of agents associated with hypertension: none.   ----------------------------------------------    Allergies  Allergen Reactions  . Codeine Other (See Comments)    Made head spin      Current Outpatient Medications:  .  apixaban (ELIQUIS) 5 MG TABS tablet, Take 1 tablet (5 mg total) by mouth 2 (two) times daily., Disp: 60 tablet, Rfl: 2 .  Cyanocobalamin (VITAMIN B-12 PO), Take by mouth., Disp: , Rfl:  .  lisinopril (ZESTRIL) 20 MG tablet, Take 1 tablet (20 mg total) by mouth daily., Disp: 30 tablet, Rfl: 5 .  memantine (NAMENDA) 5 MG tablet, Take 1 tablet by mouth twice daily, Disp: 180 tablet, Rfl: 0 .  Omega-3 Fatty Acids (FISH OIL) 1000 MG CAPS, Take 1,000 mg by mouth daily. , Disp: , Rfl:   Review of Systems  Constitutional: Negative for appetite change, chills, fatigue and fever.  Respiratory: Negative for chest tightness and shortness of breath.   Cardiovascular: Negative for chest pain and palpitations.  Gastrointestinal: Negative for abdominal pain, nausea and vomiting.  Neurological: Negative for dizziness and weakness.     Social History   Tobacco Use  . Smoking status: Former Smoker    Years: 2.00    Types: Cigarettes    Quit date: 02/16/1944    Years since quitting: 74.6  . Smokeless tobacco: Never Used  Substance Use Topics  . Alcohol use: No      Objective:   BP 130/65 (BP Location: Left Arm, Patient Position: Sitting, Cuff Size: Large)   Pulse 68   Temp (!) 96.8 F (36 C) (Oral)   Resp 16   Ht 5\' 4"  (1.626 Ramirez)   Wt 144 lb (65.3 kg)   SpO2 99%   BMI 24.72 kg/Ramirez  Vitals:   10/02/18 1042  BP: 130/65  Pulse: 68  Resp: 16  Temp: (!) 96.8 F (36 C)  TempSrc: Oral  SpO2: 99%  Weight: 144 lb (65.3 kg)  Height: 5\' 4"  (1.626 Ramirez)     Physical Exam Vitals signs reviewed.  Constitutional:      General: She is not in acute distress.    Appearance: Normal appearance.  HENT:     Head: Normocephalic and atraumatic.  Eyes:     General: No scleral icterus.    Conjunctiva/sclera: Conjunctivae normal.  Cardiovascular:     Rate and Rhythm: Normal rate and regular rhythm.     Pulses: Normal pulses.     Heart sounds: Normal heart sounds. No murmur.  Pulmonary:     Effort: Pulmonary effort is normal. No  respiratory distress.     Breath sounds: Normal breath sounds. No wheezing.  Abdominal:     General: There is no distension.     Palpations: Abdomen is soft.     Tenderness: There is no abdominal tenderness.  Musculoskeletal:     Right lower leg: No edema.     Left lower leg: Edema (1-2+) present.  Skin:    General: Skin is warm and dry.     Findings: No rash.  Neurological:     Mental Status: She is alert. Mental status is at baseline.  Psychiatric:        Mood and Affect: Mood normal.        Behavior: Behavior normal.      No results found for any visits on 10/02/18.     Assessment & Plan   Problem List Items Addressed This Visit      Cardiovascular and Mediastinum   DVT, lower extremity, recurrent, left (HCC)    Diagnosed with LLE DVT in 08/2018 Doing well on Eliquis  Will refer to CCM for medication affordability Had IVC filter placed in 2015 - doubt she is a candidate for removal, though this may be a nidus for clots at this time Given recurrence of DVT, may need lifelong anticoagulation      Relevant Orders   Referral to Chronic Care Management Services   CBC w/Diff/Platelet   Essential hypertension - Primary    Well controlled Continue current medications Recheck metabolic panel F/u in 6 months       Relevant Orders   Renal Function Panel     Nervous and Auditory   Dementia (HCC)    Chronic and stable Did not tolerate Aricept due to diarrhea CCM for medication affordability      Relevant Orders   Referral to Chronic Care Management Services    Other Visit Diagnoses    Anemia, unspecified type       Relevant Medications   Cyanocobalamin (VITAMIN B-12 PO)   Other Relevant Orders   CBC w/Diff/Platelet   Hyperkalemia       Relevant Orders   Renal Function Panel       Return if symptoms worsen or fail to improve.   The entirety of the information documented in the History of Present Illness, Review of Systems and Physical Exam were personally obtained by me. Portions of this information were initially documented by Kerri Ramirez, CMA and reviewed by me for thoroughness and accuracy.    , Kerri SchleinAngela M, MD Ramirez Kerri Ramirez

## 2018-10-02 NOTE — Patient Instructions (Signed)

## 2018-10-02 NOTE — Assessment & Plan Note (Signed)
Diagnosed with LLE DVT in 08/2018 Doing well on Eliquis Will refer to CCM for medication affordability Had IVC filter placed in 2015 - doubt she is a candidate for removal, though this may be a nidus for clots at this time Given recurrence of DVT, may need lifelong anticoagulation

## 2018-10-02 NOTE — Assessment & Plan Note (Signed)
Chronic and stable Did not tolerate Aricept due to diarrhea CCM for medication affordability

## 2018-10-03 ENCOUNTER — Telehealth: Payer: Self-pay

## 2018-10-03 LAB — CBC WITH DIFFERENTIAL/PLATELET
Basophils Absolute: 0 10*3/uL (ref 0.0–0.2)
Basos: 1 %
EOS (ABSOLUTE): 0.1 10*3/uL (ref 0.0–0.4)
Eos: 1 %
Hematocrit: 22.6 % — ABNORMAL LOW (ref 34.0–46.6)
Hemoglobin: 7.4 g/dL — ABNORMAL LOW (ref 11.1–15.9)
Immature Grans (Abs): 0 10*3/uL (ref 0.0–0.1)
Immature Granulocytes: 0 %
Lymphocytes Absolute: 1.3 10*3/uL (ref 0.7–3.1)
Lymphs: 15 %
MCH: 30.7 pg (ref 26.6–33.0)
MCHC: 32.7 g/dL (ref 31.5–35.7)
MCV: 94 fL (ref 79–97)
Monocytes Absolute: 0.4 10*3/uL (ref 0.1–0.9)
Monocytes: 5 %
Neutrophils Absolute: 6.7 10*3/uL (ref 1.4–7.0)
Neutrophils: 78 %
Platelets: 306 10*3/uL (ref 150–450)
RBC: 2.41 x10E6/uL — CL (ref 3.77–5.28)
RDW: 12.2 % (ref 11.7–15.4)
WBC: 8.6 10*3/uL (ref 3.4–10.8)

## 2018-10-03 LAB — RENAL FUNCTION PANEL
Albumin: 3.8 g/dL (ref 3.5–4.6)
BUN/Creatinine Ratio: 29 — ABNORMAL HIGH (ref 12–28)
BUN: 33 mg/dL (ref 10–36)
CO2: 13 mmol/L — ABNORMAL LOW (ref 20–29)
Calcium: 9.4 mg/dL (ref 8.7–10.3)
Chloride: 116 mmol/L (ref 96–106)
Creatinine, Ser: 1.14 mg/dL — ABNORMAL HIGH (ref 0.57–1.00)
GFR calc Af Amer: 49 mL/min/{1.73_m2} — ABNORMAL LOW (ref >59)
GFR calc non Af Amer: 42 mL/min/{1.73_m2} — ABNORMAL LOW (ref >59)
Glucose: 88 mg/dL (ref 65–99)
Phosphorus: 3.4 mg/dL (ref 3.0–4.3)
Potassium: 4.8 mmol/L (ref 3.5–5.2)
Sodium: 142 mmol/L (ref 134–144)

## 2018-10-03 NOTE — Telephone Encounter (Signed)
Patient's daughter Neoma Laming) was advised.

## 2018-10-03 NOTE — Telephone Encounter (Signed)
-----   Message from [name], MD sent at 10/03/2018 11:25 AM EDT ----- Stable kidney function.  Anemia is getting worse.  Would like for her to see Hematology about this. OK to place referral if patient/her daughter agree to referral

## 2018-10-06 ENCOUNTER — Ambulatory Visit: Payer: Self-pay | Admitting: Pharmacist

## 2018-10-06 NOTE — Chronic Care Management (AMB) (Signed)
  Chronic Care Management   Note  10/06/2018 Name: Kerri Ramirez MRN: [id number] DOB: 1925-09-04  85 y.o. year old female referred to Chronic Care Management by Dr. Brita Romp for med management- Eliquis, Namenda. Last office visit with [name], MD was 10/02/18.   Was unable to reach patient via telephone today and have left HIPAA compliant voicemail asking patient to return my call. (unsuccessful outreach #1)  Follow up plan: A HIPPA compliant phone message was left for the patient providing contact information and requesting a return call.  The care management team will reach out to the patient again over the next 5-7 days.   Ruben Reason, PharmD Clinical Pharmacist [place] [phone]

## 2018-10-26 ENCOUNTER — Ambulatory Visit: Payer: Self-pay | Admitting: Pharmacist

## 2018-10-26 ENCOUNTER — Encounter: Payer: Self-pay | Admitting: Pharmacist

## 2018-10-26 DIAGNOSIS — F0390 Unspecified dementia without behavioral disturbance: Secondary | ICD-10-CM

## 2018-10-26 DIAGNOSIS — I82402 Acute embolism and thrombosis of unspecified deep veins of left lower extremity: Secondary | ICD-10-CM

## 2018-10-26 NOTE — Chronic Care Management (AMB) (Signed)
  Chronic Care Management   Note  10/26/2018 Name: Kerri Ramirez MRN: [id number] DOB: 23-Apr-1925  Kerri Ramirez is a 85 y.o. year old female who sees Bacigalupo, Dionne Bucy, MD for primary care. Dr. B asked the CCM team to consult the patient for assistance with chronic disease management related to medication assistance. Referral was placed 10/02/18. Telephone outreach to patient's daughter Earnest Bailey [facility])  today to introduce CCM services.    Plan: Patient's daughter Earnest Bailey declined CCM services   Earnest Bailey, patient's daughter on [place],  was given information about Chronic Care Management services today including:  1. CCM service includes personalized support from designated clinical staff supervised by her physician, including individualized plan of care and coordination with other care providers 2. 24/7 contact phone numbers for assistance for urgent and routine care needs. 3. Service will only be billed when office clinical staff spend 20 minutes or more in a month to coordinate care. 4. Only one practitioner may furnish and bill the service in a calendar month. 5. The patient may stop CCM services at any time (effective at the end of the month) by phone call to the office staff. 6. The patient will be responsible for cost sharing (co-pay) of up to 20% of the service fee (after annual deductible is met).  Patient did not agree to enrollment in care management services and does not wish to consider at this time.    Ruben Reason, PharmD Clinical Pharmacist [place] [phone]

## 2018-10-26 NOTE — Progress Notes (Signed)
This encounter was created in error - please disregard.

## 2018-11-17 ENCOUNTER — Ambulatory Visit (INDEPENDENT_AMBULATORY_CARE_PROVIDER_SITE_OTHER): Payer: Medicare Other | Admitting: Family Medicine

## 2018-11-17 ENCOUNTER — Encounter: Payer: Self-pay | Admitting: Family Medicine

## 2018-11-17 DIAGNOSIS — L03114 Cellulitis of left upper limb: Secondary | ICD-10-CM | POA: Diagnosis not present

## 2018-11-17 DIAGNOSIS — S51012A Laceration without foreign body of left elbow, initial encounter: Secondary | ICD-10-CM | POA: Diagnosis not present

## 2018-11-17 MED ORDER — SULFAMETHOXAZOLE-TRIMETHOPRIM 800-160 MG PO TABS: 1.0000 | ORAL_TABLET | Freq: Two times a day (BID) | ORAL | 0 refills | Status: AC

## 2018-11-17 NOTE — Progress Notes (Signed)
Patient: Kerri Ramirez Female    DOB: 1925/09/13   85 y.o.   MRN: [id number] Visit Date: 11/17/2018  Today's Provider: Lavon Paganini, MD   Chief Complaint  Patient presents with  . Skin Problem   Subjective:    I, Porsha McClurkin CMA, am acting as a scribe for Lavon Paganini, MD.   Virtual Visit via Telephone Note  I connected with Dahlia Bailiff on 11/17/18 at 10:40 AM EDT by telephone and verified that I am speaking with the correct person using two identifiers.  Patient location: home Provider location: [place] involved in the visit: patient, provider, patient's daughter   I discussed the limitations, risks, security and privacy concerns of performing an evaluation and management service by telephone and the availability of in person appointments. I also discussed with the patient that there may be a patient responsible charge related to this service. The patient expressed understanding and agreed to proceed.  HPI  Skin Problem Patient presents today for a skin tear to her left arm from a fall last Friday,11/10/2018. Patient's daughter Neoma Laming) believes that the place maybe infected. Daughter states that it is bleeding and looks very bad.  Patient is refusing to come into the doctor's office.  Daughter tried to clean it and dress with large band-aid and bacitracin. Has purulent drainage  She denies any other injuries from the fall  Allergies  Allergen Reactions  . Codeine Other (See Comments)    Made head spin      Current Outpatient Medications:  .  apixaban (ELIQUIS) 5 MG TABS tablet, Take 1 tablet (5 mg total) by mouth 2 (two) times daily., Disp: 60 tablet, Rfl: 2 .  Cyanocobalamin (VITAMIN B-12 PO), Take by mouth., Disp: , Rfl:  .  lisinopril (ZESTRIL) 20 MG tablet, Take 1 tablet (20 mg total) by mouth daily., Disp: 30 tablet, Rfl: 5 .  memantine (NAMENDA) 5 MG tablet, Take 1 tablet by mouth twice daily, Disp: 180  tablet, Rfl: 0 .  Omega-3 Fatty Acids (FISH OIL) 1000 MG CAPS, Take 1,000 mg by mouth daily. , Disp: , Rfl:    Review of Systems  Constitutional: Negative.   Respiratory: Negative.   Genitourinary: Negative.   Hematological: Negative.     Social History   Tobacco Use  . Smoking status: Former Smoker    Years: 2.00    Types: Cigarettes    Quit date: 02/16/1944    Years since quitting: 74.8  . Smokeless tobacco: Never Used  Substance Use Topics  . Alcohol use: No     Objective:   There were no vitals taken for this visit. There were no vitals filed for this visit.There is no height or weight on file to calculate BMI.   Physical Exam    Picture sent by daughter  No results found for any visits on 11/17/18.     Assessment & Plan    I discussed the assessment and treatment plan with the patient. The patient was provided an opportunity to ask questions and all were answered. The patient agreed with the plan and demonstrated an understanding of the instructions.   The patient was advised to call back or seek an in-person evaluation if the symptoms worsen or if the condition fails to improve as anticipated.   1. Cellulitis of left upper extremity 2. Skin tear of left elbow without complication, initial encounter - skin tear from fall and now appears to have some cellulitis -  needs debridement and wound care, but refuses to come into the office -We will try to send home health nurse out to evaluate the wound and do basic wound care -We will treat cellulitis with Bactrim twice daily - discussed return precautions - Ambulatory referral to Home Health    Meds ordered this encounter  Medications  . sulfamethoxazole-trimethoprim (BACTRIM DS) 800-160 MG tablet    Sig: Take 1 tablet by mouth 2 (two) times daily for 7 days.    Dispense:  14 tablet    Refill:  0    The entirety of the information documented in the History of Present Illness, Review of Systems and Physical  Exam were personally obtained by me. Portions of this information were initially documented by [facility], CMA and reviewed by me for thoroughness and accuracy.    Layloni Fahrner, Marzella Schlein, MD MPH [facility]

## 2018-11-21 ENCOUNTER — Telehealth: Payer: Self-pay

## 2018-11-21 NOTE — Telephone Encounter (Signed)
Pt's daughter Kerri Ramirez stated she still hasn't heard from anyone with [facility] about coming to pt's home for wound care. Kerri Ramirez is requesting a call back to discuss what she needs to do to get home health to pt's home. Please advise. Thanks TNP

## 2018-11-21 NOTE — Telephone Encounter (Signed)
Pt's daughter called back saying no one has called them for a referral to do in home health care for her mom  Con [place]

## 2018-11-22 ENCOUNTER — Telehealth: Payer: Self-pay | Admitting: Family Medicine

## 2018-11-22 DIAGNOSIS — K219 Gastro-esophageal reflux disease without esophagitis: Secondary | ICD-10-CM | POA: Diagnosis not present

## 2018-11-22 DIAGNOSIS — Z87891 Personal history of nicotine dependence: Secondary | ICD-10-CM | POA: Diagnosis not present

## 2018-11-22 DIAGNOSIS — K559 Vascular disorder of intestine, unspecified: Secondary | ICD-10-CM | POA: Diagnosis not present

## 2018-11-22 DIAGNOSIS — L03112 Cellulitis of left axilla: Secondary | ICD-10-CM | POA: Diagnosis not present

## 2018-11-22 DIAGNOSIS — Z4800 Encounter for change or removal of nonsurgical wound dressing: Secondary | ICD-10-CM | POA: Diagnosis not present

## 2018-11-22 DIAGNOSIS — F0390 Unspecified dementia without behavioral disturbance: Secondary | ICD-10-CM | POA: Diagnosis not present

## 2018-11-22 DIAGNOSIS — Z792 Long term (current) use of antibiotics: Secondary | ICD-10-CM | POA: Diagnosis not present

## 2018-11-22 DIAGNOSIS — S51012D Laceration without foreign body of left elbow, subsequent encounter: Secondary | ICD-10-CM | POA: Diagnosis not present

## 2018-11-22 DIAGNOSIS — M109 Gout, unspecified: Secondary | ICD-10-CM | POA: Diagnosis not present

## 2018-11-22 DIAGNOSIS — I82402 Acute embolism and thrombosis of unspecified deep veins of left lower extremity: Secondary | ICD-10-CM | POA: Diagnosis not present

## 2018-11-22 DIAGNOSIS — Z9181 History of falling: Secondary | ICD-10-CM | POA: Diagnosis not present

## 2018-11-22 DIAGNOSIS — E785 Hyperlipidemia, unspecified: Secondary | ICD-10-CM | POA: Diagnosis not present

## 2018-11-22 DIAGNOSIS — M810 Age-related osteoporosis without current pathological fracture: Secondary | ICD-10-CM | POA: Diagnosis not present

## 2018-11-22 DIAGNOSIS — I10 Essential (primary) hypertension: Secondary | ICD-10-CM | POA: Diagnosis not present

## 2018-11-22 DIAGNOSIS — D692 Other nonthrombocytopenic purpura: Secondary | ICD-10-CM | POA: Diagnosis not present

## 2018-11-22 DIAGNOSIS — D509 Iron deficiency anemia, unspecified: Secondary | ICD-10-CM | POA: Diagnosis not present

## 2018-11-22 DIAGNOSIS — Z7901 Long term (current) use of anticoagulants: Secondary | ICD-10-CM | POA: Diagnosis not present

## 2018-11-22 NOTE — Telephone Encounter (Signed)
Janett Billow, RN w/ [facility] [phone]  Wound left forearm.  Needing wound orders.  Janett Billow has some suggestions for treatment and needing ok.  Thanks, [employer]

## 2018-11-22 NOTE — Telephone Encounter (Signed)
Can someone check into the status of [place] referral?

## 2018-11-22 NOTE — Telephone Encounter (Signed)
Called referrals for status. Per Helene Kelp in referrals [place] from [facility] is suppose to call patient after 11/19/2018.

## 2018-11-22 NOTE — Telephone Encounter (Signed)
Please advise 

## 2018-11-22 NOTE — Telephone Encounter (Signed)
Spoke ot Bracey and gave verbal orders for visits twice a week for cleaning, Vaseline gauze, and wrapping with clean gauze

## 2018-11-25 ENCOUNTER — Inpatient Hospital Stay
Admission: EM | Admit: 2018-11-25 | Discharge: 2018-11-28 | DRG: 378 | Disposition: A | Discharge status: Home Health | Payer: Medicare Other | Attending: Internal Medicine | Admitting: Internal Medicine

## 2018-11-25 ENCOUNTER — Other Ambulatory Visit: Payer: Self-pay

## 2018-11-25 ENCOUNTER — Emergency Department: Payer: Medicare Other

## 2018-11-25 DIAGNOSIS — Z7901 Long term (current) use of anticoagulants: Secondary | ICD-10-CM | POA: Diagnosis not present

## 2018-11-25 DIAGNOSIS — D62 Acute posthemorrhagic anemia: Secondary | ICD-10-CM | POA: Diagnosis present

## 2018-11-25 DIAGNOSIS — G309 Alzheimer's disease, unspecified: Secondary | ICD-10-CM | POA: Diagnosis present

## 2018-11-25 DIAGNOSIS — K922 Gastrointestinal hemorrhage, unspecified: Principal | ICD-10-CM | POA: Diagnosis present

## 2018-11-25 DIAGNOSIS — I447 Left bundle-branch block, unspecified: Secondary | ICD-10-CM | POA: Diagnosis present

## 2018-11-25 DIAGNOSIS — D649 Anemia, unspecified: Secondary | ICD-10-CM | POA: Diagnosis not present

## 2018-11-25 DIAGNOSIS — I440 Atrioventricular block, first degree: Secondary | ICD-10-CM | POA: Diagnosis present

## 2018-11-25 DIAGNOSIS — I4430 Unspecified atrioventricular block: Secondary | ICD-10-CM | POA: Diagnosis not present

## 2018-11-25 DIAGNOSIS — Z85038 Personal history of other malignant neoplasm of large intestine: Secondary | ICD-10-CM | POA: Diagnosis not present

## 2018-11-25 DIAGNOSIS — Z885 Allergy status to narcotic agent status: Secondary | ICD-10-CM

## 2018-11-25 DIAGNOSIS — I959 Hypotension, unspecified: Secondary | ICD-10-CM | POA: Diagnosis present

## 2018-11-25 DIAGNOSIS — D509 Iron deficiency anemia, unspecified: Secondary | ICD-10-CM | POA: Diagnosis present

## 2018-11-25 DIAGNOSIS — E875 Hyperkalemia: Secondary | ICD-10-CM | POA: Diagnosis present

## 2018-11-25 DIAGNOSIS — R0902 Hypoxemia: Secondary | ICD-10-CM | POA: Diagnosis not present

## 2018-11-25 DIAGNOSIS — N179 Acute kidney failure, unspecified: Secondary | ICD-10-CM | POA: Diagnosis present

## 2018-11-25 DIAGNOSIS — F0280 Dementia in other diseases classified elsewhere without behavioral disturbance: Secondary | ICD-10-CM | POA: Diagnosis present

## 2018-11-25 DIAGNOSIS — Z8249 Family history of ischemic heart disease and other diseases of the circulatory system: Secondary | ICD-10-CM | POA: Diagnosis not present

## 2018-11-25 DIAGNOSIS — N1832 Chronic kidney disease, stage 3b: Secondary | ICD-10-CM | POA: Diagnosis present

## 2018-11-25 DIAGNOSIS — I441 Atrioventricular block, second degree: Secondary | ICD-10-CM | POA: Diagnosis present

## 2018-11-25 DIAGNOSIS — K5909 Other constipation: Secondary | ICD-10-CM | POA: Diagnosis present

## 2018-11-25 DIAGNOSIS — Z87891 Personal history of nicotine dependence: Secondary | ICD-10-CM

## 2018-11-25 DIAGNOSIS — Z20828 Contact with and (suspected) exposure to other viral communicable diseases: Secondary | ICD-10-CM | POA: Diagnosis present

## 2018-11-25 DIAGNOSIS — R069 Unspecified abnormalities of breathing: Secondary | ICD-10-CM | POA: Diagnosis not present

## 2018-11-25 DIAGNOSIS — I82409 Acute embolism and thrombosis of unspecified deep veins of unspecified lower extremity: Secondary | ICD-10-CM

## 2018-11-25 DIAGNOSIS — Z86718 Personal history of other venous thrombosis and embolism: Secondary | ICD-10-CM | POA: Diagnosis not present

## 2018-11-25 DIAGNOSIS — R0602 Shortness of breath: Secondary | ICD-10-CM | POA: Diagnosis not present

## 2018-11-25 DIAGNOSIS — Z79899 Other long term (current) drug therapy: Secondary | ICD-10-CM

## 2018-11-25 DIAGNOSIS — I129 Hypertensive chronic kidney disease with stage 1 through stage 4 chronic kidney disease, or unspecified chronic kidney disease: Secondary | ICD-10-CM | POA: Diagnosis present

## 2018-11-25 DIAGNOSIS — M79661 Pain in right lower leg: Secondary | ICD-10-CM | POA: Diagnosis not present

## 2018-11-25 DIAGNOSIS — Z0389 Encounter for observation for other suspected diseases and conditions ruled out: Secondary | ICD-10-CM | POA: Diagnosis not present

## 2018-11-25 HISTORY — DX: Unspecified dementia, unspecified severity, without behavioral disturbance, psychotic disturbance, mood disturbance, and anxiety: F03.90

## 2018-11-25 HISTORY — DX: Deep phlebothrombosis in pregnancy, unspecified trimester: O22.30

## 2018-11-25 LAB — BASIC METABOLIC PANEL
Anion gap: 5 (ref 5–15)
BUN: 70 mg/dL — ABNORMAL HIGH (ref 8–23)
CO2: 13 mmol/L — ABNORMAL LOW (ref 22–32)
Calcium: 9.6 mg/dL (ref 8.9–10.3)
Chloride: 119 mmol/L — ABNORMAL HIGH (ref 98–111)
Creatinine, Ser: 2.62 mg/dL — ABNORMAL HIGH (ref 0.44–1.00)
GFR calc Af Amer: 18 mL/min — ABNORMAL LOW (ref >60)
GFR calc non Af Amer: 15 mL/min — ABNORMAL LOW (ref >60)
Glucose, Bld: 112 mg/dL — ABNORMAL HIGH (ref 70–99)
Potassium: 5.8 mmol/L — ABNORMAL HIGH (ref 3.5–5.1)
Sodium: 137 mmol/L (ref 135–145)

## 2018-11-25 LAB — COMPREHENSIVE METABOLIC PANEL
ALT: 19 U/L (ref 0–44)
AST: 71 U/L — ABNORMAL HIGH (ref 15–41)
Albumin: 3.5 g/dL (ref 3.5–5.0)
Alkaline Phosphatase: 59 U/L (ref 38–126)
Anion gap: 12 (ref 5–15)
BUN: 75 mg/dL — ABNORMAL HIGH (ref 8–23)
CO2: 8 mmol/L — ABNORMAL LOW (ref 22–32)
Calcium: 9.9 mg/dL (ref 8.9–10.3)
Chloride: 117 mmol/L — ABNORMAL HIGH (ref 98–111)
Creatinine, Ser: 2.91 mg/dL — ABNORMAL HIGH (ref 0.44–1.00)
GFR calc Af Amer: 16 mL/min — ABNORMAL LOW (ref >60)
GFR calc non Af Amer: 14 mL/min — ABNORMAL LOW (ref >60)
Glucose, Bld: 135 mg/dL — ABNORMAL HIGH (ref 70–99)
Potassium: 5.5 mmol/L — ABNORMAL HIGH (ref 3.5–5.1)
Sodium: 137 mmol/L (ref 135–145)
Total Bilirubin: 0.5 mg/dL (ref 0.3–1.2)
Total Protein: 6.2 g/dL — ABNORMAL LOW (ref 6.5–8.1)

## 2018-11-25 LAB — CBC WITH DIFFERENTIAL/PLATELET
Abs Immature Granulocytes: 0.02 10*3/uL (ref 0.00–0.07)
Basophils Absolute: 0 10*3/uL (ref 0.0–0.1)
Basophils Relative: 0 %
Eosinophils Absolute: 0.2 10*3/uL (ref 0.0–0.5)
Eosinophils Relative: 2 %
HCT: 22.3 % — ABNORMAL LOW (ref 36.0–46.0)
Hemoglobin: 6.6 g/dL — ABNORMAL LOW (ref 12.0–15.0)
Immature Granulocytes: 0 %
Lymphocytes Relative: 37 %
Lymphs Abs: 2.9 10*3/uL (ref 0.7–4.0)
MCH: 26.4 pg (ref 26.0–34.0)
MCHC: 29.6 g/dL — ABNORMAL LOW (ref 30.0–36.0)
MCV: 89.2 fL (ref 80.0–100.0)
Monocytes Absolute: 0.3 10*3/uL (ref 0.1–1.0)
Monocytes Relative: 3 %
Neutro Abs: 4.5 10*3/uL (ref 1.7–7.7)
Neutrophils Relative %: 58 %
Platelets: 372 10*3/uL (ref 150–400)
RBC: 2.5 MIL/uL — ABNORMAL LOW (ref 3.87–5.11)
RDW: 15 % (ref 11.5–15.5)
WBC: 7.9 10*3/uL (ref 4.0–10.5)
nRBC: 0 % (ref 0.0–0.2)

## 2018-11-25 LAB — FOLATE: Folate: 34 ng/mL (ref >5.9)

## 2018-11-25 LAB — IRON AND TIBC
Iron: 166 ug/dL (ref 28–170)
Saturation Ratios: 42 % — ABNORMAL HIGH (ref 10.4–31.8)
TIBC: 398 ug/dL (ref 250–450)
UIBC: 232 ug/dL

## 2018-11-25 LAB — FERRITIN: Ferritin: 14 ng/mL (ref 11–307)

## 2018-11-25 LAB — LACTIC ACID, PLASMA
Lactic Acid, Venous: 1 mmol/L (ref 0.5–1.9)
Lactic Acid, Venous: 1 mmol/L (ref 0.5–1.9)

## 2018-11-25 LAB — ABO/RH: ABO/RH(D): A NEG

## 2018-11-25 LAB — HEMOGLOBIN: Hemoglobin: 8.7 g/dL — ABNORMAL LOW (ref 12.0–15.0)

## 2018-11-25 LAB — PREPARE RBC (CROSSMATCH)

## 2018-11-25 LAB — SARS CORONAVIRUS 2 (TAT 6-24 HRS): SARS Coronavirus 2: NEGATIVE

## 2018-11-25 MED ORDER — SODIUM CHLORIDE 0.9 % IV SOLN: 10.0000 mL/h | Freq: Once | INTRAVENOUS | Status: DC

## 2018-11-25 MED ORDER — ACETAMINOPHEN 325 MG PO TABS: 650.0000 mg | ORAL_TABLET | Freq: Four times a day (QID) | ORAL | Status: DC | PRN

## 2018-11-25 MED ORDER — SODIUM CHLORIDE 0.9 % IV SOLN
8.0000 mg/h | INTRAVENOUS | Status: DC
  Administered 2018-11-25: 8 mg/h via INTRAVENOUS
  Filled 2018-11-25: qty 80

## 2018-11-25 MED ORDER — ONDANSETRON HCL 4 MG PO TABS: 4.0000 mg | ORAL_TABLET | Freq: Four times a day (QID) | ORAL | Status: DC | PRN

## 2018-11-25 MED ORDER — PANTOPRAZOLE SODIUM 40 MG IV SOLR: 40.0000 mg | Freq: Two times a day (BID) | INTRAVENOUS | Status: DC

## 2018-11-25 MED ORDER — ONDANSETRON HCL 4 MG/2ML IJ SOLN: 4.0000 mg | Freq: Four times a day (QID) | INTRAMUSCULAR | Status: DC | PRN

## 2018-11-25 MED ORDER — SODIUM CHLORIDE 0.9 % IV SOLN
80.0000 mg | Freq: Once | INTRAVENOUS | Status: AC
  Administered 2018-11-25: 80 mg via INTRAVENOUS
  Filled 2018-11-25: qty 80

## 2018-11-25 MED ORDER — ALBUTEROL SULFATE (2.5 MG/3ML) 0.083% IN NEBU: 2.5000 mg | INHALATION_SOLUTION | RESPIRATORY_TRACT | Status: DC | PRN

## 2018-11-25 MED ORDER — LACTATED RINGERS IV BOLUS
500.0000 mL | Freq: Once | INTRAVENOUS | Status: AC
  Administered 2018-11-25: 18:00:00 500 mL via INTRAVENOUS

## 2018-11-25 MED ORDER — POLYETHYLENE GLYCOL 3350 17 G PO PACK
17.0000 g | PACK | Freq: Every day | ORAL | Status: DC
  Administered 2018-11-25 – 2018-11-28 (×3): 17 g via ORAL
  Filled 2018-11-25 (×3): qty 1

## 2018-11-25 MED ORDER — SODIUM CHLORIDE 0.9 % IV SOLN: Freq: Once | INTRAVENOUS | Status: DC

## 2018-11-25 MED ORDER — SODIUM BICARBONATE 8.4 % IV SOLN
INTRAVENOUS | Status: AC
  Administered 2018-11-25 – 2018-11-26 (×2): via INTRAVENOUS
  Filled 2018-11-25 (×2): qty 100

## 2018-11-25 MED ORDER — ACETAMINOPHEN 650 MG RE SUPP: 650.0000 mg | Freq: Four times a day (QID) | RECTAL | Status: DC | PRN

## 2018-11-25 NOTE — H&P (Addendum)
[facility] - [place] at [facility]   PATIENT NAME: Kerri Ramirez    MR#:  [id number]  DATE OF BIRTH:  21-Sep-1925  DATE OF ADMISSION:  11/25/2018  PRIMARY CARE PHYSICIAN: Kerri Downer, MD   REQUESTING/REFERRING PHYSICIAN: Dr. Derrill Ramirez  CHIEF COMPLAINT:   Chief Complaint  Patient presents with  . Shortness of Breath    HISTORY OF PRESENT ILLNESS:  Kerri Ramirez  is a 85 y.o. female with a known history of DVT on Eliquis, hypertension, Alzheimer's dementia presents from nursing home due to shortness of breath.  Patient did not complain of any shortness of breath here in the emergency room.  On lab work had hemoglobin of 6.6.  Stool positive for occult blood.  No frank bleeding.  Vitals stable.  Patient also found to have acute kidney injury and being admitted to the hospitalist service.  Old records reviewed.  Discussed with ED staff and discussed with healthcare power of attorney her daughter over the phone.  Patient is poor historian with her dementia.  PAST MEDICAL HISTORY:   Past Medical History:  Diagnosis Date  . Dementia (HCC)   . DVT (deep vein thrombosis) in pregnancy   . Hypertension     PAST SURGICAL HISTORY:   Past Surgical History:  Procedure Laterality Date  . ANKLE SURGERY Left 1981  . BACK SURGERY  1987   broken vertebra  . COLONOSCOPY     Kerri Ramirez  . HEMICOLECTOMY Right 07-25-13   Kerri Ramirez/diverticulitis  . IVC FILTER PLACEMENT ([facility] HX)  09/01/2013    SOCIAL HISTORY:   Social History   Tobacco Use  . Smoking status: Former Smoker    Years: 2.00    Types: Cigarettes    Quit date: 02/16/1944    Years since quitting: 74.8  . Smokeless tobacco: Never Used  Substance Use Topics  . Alcohol use: No    FAMILY HISTORY:   Family History  Problem Relation Age of Onset  . Heart attack Father   . Heart attack Brother   . Heart attack Brother     DRUG ALLERGIES:   Allergies  Allergen Reactions  . Codeine Other  (See Comments)    Made head spin     REVIEW OF SYSTEMS:   Review of Systems  Unable to perform ROS: Dementia   MEDICATIONS AT HOME:   Prior to Admission medications   Medication Sig Start Date End Date Taking? Authorizing Provider  apixaban (ELIQUIS) 5 MG TABS tablet Take 1 tablet (5 mg total) by mouth 2 (two) times daily. 09/29/18  Yes Kerri Ramirez, Kerri Schlein, MD  lisinopril (ZESTRIL) 20 MG tablet Take 1 tablet (20 mg total) by mouth daily. 09/01/18  Yes Kerri Ramirez, Kerri Schlein, MD  memantine (NAMENDA) 5 MG tablet Take 1 tablet by mouth twice daily Patient taking differently: Take 5 mg by mouth 2 (two) times daily.  09/29/18  Yes Kerri Ramirez, Kerri Schlein, MD  Omega-3 Fatty Acids (FISH OIL) 1000 MG CAPS Take 1,000 mg by mouth daily.    Yes [provider]  vitamin B-12 (CYANOCOBALAMIN) 1000 MCG tablet Take 1,000 mcg by mouth daily.    Yes [provider]   VITAL SIGNS:  Blood pressure 102/62, pulse 82, temperature (!) 97.5 F (36.4 C), temperature source Oral, resp. rate 15, height 5\' 5"  (1.651 m), weight 64.2 kg, SpO2 100 %.  PHYSICAL EXAMINATION:  Physical Exam  GENERAL:  85 y.o.-year-old patient lying in the bed with no acute distress.  EYES: Pupils  equal, round, reactive to light and accommodation. No scleral icterus. Extraocular muscles intact.  HEENT: Head atraumatic, normocephalic. Oropharynx and nasopharynx clear. No oropharyngeal erythema, moist oral mucosa  NECK:  Supple, no jugular venous distention. No thyroid enlargement, no tenderness.  LUNGS: Normal breath sounds bilaterally, no wheezing, rales, rhonchi. No use of accessory muscles of respiration.  CARDIOVASCULAR: S1, S2 normal. No murmurs, rubs, or gallops.  ABDOMEN: Soft, nontender, nondistended. Bowel sounds present. No organomegaly or mass.  EXTREMITIES: No pedal edema, cyanosis, or clubbing. + 2 pedal & radial pulses b/l.   NEUROLOGIC: Cranial nerves II through XII are intact. No focal Motor or sensory  deficits appreciated b/l PSYCHIATRIC: The patient is alert and oriented x 3. Good affect.  SKIN: No obvious rash, lesion, or ulcer.   LABORATORY PANEL:   CBC Recent Labs  Lab 11/25/18 0757  WBC 7.9  HGB 6.6*  HCT 22.3*  PLT 372   ------------------------------------------------------------------------------------------------------------------  Chemistries  Recent Labs  Lab 11/25/18 0757  NA 137  K 5.5*  CL 117*  CO2 8*  GLUCOSE 135*  BUN 75*  CREATININE 2.91*  CALCIUM 9.9  AST 71*  ALT 19  ALKPHOS 59  BILITOT 0.5   ------------------------------------------------------------------------------------------------------------------  Cardiac Enzymes No results for input(s): TROPONINI in the last 168 hours. ------------------------------------------------------------------------------------------------------------------  RADIOLOGY:  Dg Chest Portable 1 View  Result Date: 11/25/2018 CLINICAL DATA:  Pt arrives via EMS from home after having difficulty breathing starting this morning- states she had a similar episode 2 days ago but did not come to the hospital- per EMS RA sats were 84%- placed pt on nonrebreather at 15L and o.*comment was truncated*Shortness of breath EXAM: PORTABLE CHEST 1 VIEW COMPARISON:  07/31/2013 FINDINGS: Normal cardiac silhouette. Chronic bronchitic markings. No focal consolidation. No pneumothorax. No acute osseous abnormality. IMPRESSION: Chronic bronchitic markings. No acute findings. Electronically Signed   By: Suzy Bouchard M.D.   On: 11/25/2018 08:35     IMPRESSION AND PLAN:   *GI bleed.  Likely chronic.  Hemoglobin has slowly trended down over the past few months.  This time hemoglobin is worse and it is unclear if this is acutely worsened.  Will start Protonix drip.  Transfused 2 units packed RBC stat.  Consult GI.  Sent message to Kerri. Marius Ditch. Hold Eliquis N.p.o.  *Hypertension.  Hold home medications.  Blood pressure low  normal.  *Acute kidney injury likely secondary to hypotension/GI bleed and decreased perfusion. Will start IV fluids with bicarb.  *Hyperkalemia secondary to acute kidney injury.  Should improve now that patient is started on bicarb and IV fluids.  *Dementia.  Monitor for inpatient delirium.  DVT prophylaxis with SCDs  All the records are reviewed and case discussed with ED provider. Management plans discussed with the patient, family and they are in agreement.  CODE STATUS: FULL CODE  TOTAL CRITICAL CARE TIME TAKING CARE OF THIS PATIENT: 80 minutes.   Leia Alf Duanna Runk M.D on 11/25/2018 at 10:11 AM  Between 7am to 6pm - Pager - [phone]  After 6pm go to [email or web address] - password [email or web address] [place] [facility]  Office  [phone]  CC: Primary care physician; [name], MD  Note: This dictation was prepared with Dragon dictation along with smaller phrase technology. Any transcriptional errors that result from this process are unintentional.

## 2018-11-25 NOTE — ED Notes (Signed)
Attempted to call report- was told pt was not assigned to a nurse yet- will call back in 15 minutes

## 2018-11-25 NOTE — ED Notes (Signed)
Pt daughter states that she though pt had accidentally taken out IV- upon assessment IV is still in place and running

## 2018-11-25 NOTE — ED Notes (Signed)
Report given to Deidra, RN.

## 2018-11-25 NOTE — ED Provider Notes (Signed)
[facility] Emergency Department Provider Note  ____________________________________________   I have reviewed the triage vital signs and the nursing notes.   HISTORY  Chief Complaint Shortness of Breath   History limited by: Dementia   HPI Kerri Ramirez is a 85 y.o. female who presents to the emergency department today via EMS because of concerns for shortness of breath.  Patient herself cannot give me a good history and is unsure why she came to the emergency department.  Per report the patient was initially found to be hypoxic.  Patient denied any shortness of breath.  Patient denied any pain.  Patient has not noticed any bleeding.   Records reviewed. Per medical record review patient has a history of HTN, dementia. Decreasing hemoglobin recently on outpatient labs.   Past Medical History:  Diagnosis Date  . Hypertension     Patient Active Problem List   Diagnosis Date Noted  . Senile purpura (HCC) 11/01/2017  . Hypernatremia 11/01/2017  . Fracture of metacarpal bone 09/07/2016  . Ventral hernia without obstruction or gangrene 05/09/2015  . Dementia (HCC) 04/28/2015  . DVT, lower extremity, recurrent, left (HCC) 10/29/2014  . Acid reflux 10/29/2014  . Prediabetes 10/29/2014  . Gout 10/29/2014  . 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 HLD (hyperlipidemia) 10/29/2014  . Essential hypertension 10/29/2014  . History of iron deficiency anemia 10/29/2014  . Colitis, ischemic (HCC) 10/29/2014  . LBP (low back pain) 10/29/2014  . Adiposity 10/29/2014  . OP (osteoporosis) 10/29/2014  . Diarrhea 10/29/2014  . Telogen effluvium 10/29/2014  . Tubular adenoma of colon 11/22/1997    Past Surgical History:  Procedure Laterality Date  . ANKLE SURGERY Left 1981  . BACK SURGERY  1987   broken vertebra  . COLONOSCOPY     Dr Mechele Collin  . HEMICOLECTOMY Right 07-25-13   Dr Byrd/diverticulitis  . IVC FILTER PLACEMENT ([facility] HX)  09/01/2013    Prior to  Admission medications   Medication Sig Start Date End Date Taking? Authorizing Provider  apixaban (ELIQUIS) 5 MG TABS tablet Take 1 tablet (5 mg total) by mouth 2 (two) times daily. 09/29/18   Erasmo Downer, MD  Cyanocobalamin (VITAMIN B-12 PO) Take by mouth.    [provider]  lisinopril (ZESTRIL) 20 MG tablet Take 1 tablet (20 mg total) by mouth daily. 09/01/18   Erasmo Downer, MD  memantine [facility]) 5 MG tablet Take 1 tablet by mouth twice daily 09/29/18   Bacigalupo, Marzella Schlein, MD  Omega-3 Fatty Acids (FISH OIL) 1000 MG CAPS Take 1,000 mg by mouth daily.     [provider]    Allergies Codeine  Family History  Problem Relation Age of Onset  . Heart attack Father   . Heart attack Brother   . Heart attack Brother     Social History Social History   Tobacco Use  . Smoking status: Former Smoker    Years: 2.00    Types: Cigarettes    Quit date: 02/16/1944    Years since quitting: 74.8  . Smokeless tobacco: Never Used  Substance Use Topics  . Alcohol use: No  . Drug use: No    Review of Systems Unable to obtain reliable ROS secondary to dementia.   ____________________________________________   PHYSICAL EXAM:  VITAL SIGNS: ED Triage Vitals  Enc Vitals Group     BP 11/25/18 0803 102/62     Pulse Rate 11/25/18 0801 98     Resp 11/25/18 0802 20  Temp 11/25/18 0750 (!) 96.6 F (35.9 C)     Temp Source 11/25/18 0750 Axillary     SpO2 11/25/18 0801 100 %     Weight 11/25/18 0752 141 lb 8.6 oz (64.2 kg)     Height 11/25/18 0752 5\' 5"  (1.651 m)     Head Circumference --      Peak Flow --      Pain Score 11/25/18 0750 0   Constitutional: Awake and alert. Not completely oriented.  Eyes: Conjunctivae are normal.  ENT      Head: Normocephalic and atraumatic.      Nose: No congestion/rhinnorhea.      Mouth/Throat: Mucous membranes are moist.      Neck: No stridor. Hematological/Lymphatic/Immunilogical: No cervical  lymphadenopathy. Cardiovascular: Normal rate, regular rhythm.  No murmurs, rubs, or gallops.  Respiratory: Normal respiratory effort without tachypnea nor retractions. Breath sounds are clear and equal bilaterally. No wheezes/rales/rhonchi. Gastrointestinal: Soft and non tender. No rebound. No guarding.  Rectal: Dark stool on glove. GUIAC positive.  Musculoskeletal: Normal range of motion in all extremities. No lower extremity edema. Neurologic:  Awake and alert. Not oriented to events or year.  Moving all extremities. Skin:  Skin is warm, dry and intact. No rash noted. Psychiatric: Mood and affect are normal. Speech and behavior are normal. Patient exhibits appropriate insight and judgment.  ____________________________________________    LABS (pertinent positives/negatives)  CMP na 137, k 5.5, cl 117, glu 135, bun 75, cr 2.91 CBC wbc 7.9, hgb 6.6, plt 372 ____________________________________________   EKG  I, Nance Pear, attending physician, personally viewed and interpreted this EKG  EKG Time: 0800 Rate: 97 Rhythm: atrial rhythm Axis: left axis deviation Intervals: qtc 501 QRS: LBBB ST changes: no st elevation Impression: abnormal ekg   ____________________________________________    RADIOLOGY  CXR Chronic bronchitic changes. No acute process  ____________________________________________   PROCEDURES  Procedures  CRITICAL CARE Performed by: Nance Pear   Total critical care time: 30 minutes  Critical care time was exclusive of separately billable procedures and treating other patients.  Critical care was necessary to treat or prevent imminent or life-threatening deterioration.  Critical care was time spent personally by me on the following activities: development of treatment plan with patient and/or surrogate as well as nursing, discussions with consultants, evaluation of patient's response to treatment, examination of patient, obtaining history  from patient or surrogate, ordering and performing treatments and interventions, ordering and review of laboratory studies, ordering and review of radiographic studies, pulse oximetry and re-evaluation of patient's condition.  ____________________________________________   INITIAL IMPRESSION / ASSESSMENT AND PLAN / ED COURSE  Pertinent labs & imaging results that were available during my care of the patient were reviewed by me and considered in my medical decision making (see chart for details).   Patient presented to the emergency department today because of concerns for shortness of breath.  Patient herself cannot give a good history.  While she was found to be hypoxic for EMS when she was taken off of supplemental oxygen here in the emergency department she maintained good oxygen saturation.  Blood work was sent and was notable for anemia as well as elevated creatinine.  She was guaiac positive on physical exam.  Will plan on starting Protonix to help stop further GI bleed.  Will give patient blood transfusion.  Additionally will start IV fluids given acute kidney injury.  Discussed with patient and will plan on admission.  ___________________________________________   FINAL CLINICAL IMPRESSION(S) /  ED DIAGNOSES  Final diagnoses:  Anemia, unspecified type  Gastrointestinal hemorrhage, unspecified gastrointestinal hemorrhage type  AKI (acute kidney injury) (HCC)     Note: This dictation was prepared with Dragon dictation. Any transcriptional errors that result from this process are unintentional     Phineas SemenGoodman, Alaster Asfaw, MD 11/25/18 [phone]

## 2018-11-25 NOTE — Progress Notes (Signed)
Advance care planning  Purpose of Encounter GI bleed, dementia, acute kidney injury  Parties in Attendance Patient, daughter-Deborah Collene Mares who is the healthcare power of attorney  Patients Decisional capacity Patient with dementia unable to make medical decisions.  Document healthcare power of attorney is daughter.  Discussed with her in detail regarding  Treatment plan , prognosis discussed.  All questions answered.  CODE STATUS discussed.  Daughter feels patient would not want any CPR or intubation but wants to discuss with her brother.  She had gone through making this decision and for her husband's mom recently who was extremely frail and they decided DNR/DNI.  At this time she is requesting aggressive medical care with full CODE STATUS.  Orders entered and CODE STATUS changed  FULL CODE  Time spent - 17 minutes

## 2018-11-25 NOTE — Consult Note (Signed)
Cephas Darby, MD [address]  [place]  [place], [place] [address]  Main: [phone]  Fax: [phone] Pager: [phone]   Consultation  Referring Provider:     No ref. provider found Primary Care Physician:  [name], MD Primary Gastroenterologist: none      Reason for Consultation:     Symptomatic anemia  Date of Admission:  11/25/2018 Date of Consultation:  11/25/2018         HPI:   Kerri Ramirez is a 85 y.o. female with history of dementia, limited mobility, left lower extremity DVT diagnosed in 08/2018 on Eliquis is admitted with severe symptomatic anemia, exertional dyspnea.  Patient is found to have hemoglobin of 6.6 on admission.  No known melena, hematemesis, coffee-ground emesis or rectal bleeding.  Patient lives at home with her husband.  Her daughter is the power of attorney who provided most of the history.  Apparently, on chart review patient is found to have chronic iron deficiency anemia dating back to 11/2015 and not on iron replacement therapy according to her daughter.  She also has chronic anemia dating back to 2015.  Her most recent hemoglobin was 7.4 on 01/2017, gradually declined from 8.8 in 08/2018 to 7.4 in 09/2018 to-6.6 on admission.  She is also found to have gradually declining renal function, BUN/creatinine 75/2.91 on admission According to her daughter, patient is generally constipated  She is currently full code.  Patient is able to perform simple ADLs otherwise, pretty much dependent Currently receiving 1 unit of PRBCs, started on pantoprazole drip and GI is consulted for further evaluation  NSAIDs: None  Antiplts/Anticoagulants/Anti thrombotics: Eliquis for history of DVT  GI Procedures: None  Past Medical History:  Diagnosis Date  . Dementia ([place])   . DVT (deep vein thrombosis) in pregnancy   . Hypertension     Past Surgical History:  Procedure Laterality Date  . ANKLE SURGERY Left 1981  . BACK SURGERY   1987   broken vertebra  . COLONOSCOPY     Dr Vira Agar  . HEMICOLECTOMY Right 07-25-13   Dr Byrd/diverticulitis  . IVC FILTER PLACEMENT ([facility] HX)  09/01/2013    Prior to Admission medications   Medication Sig Start Date End Date Taking? Authorizing Provider  apixaban (ELIQUIS) 5 MG TABS tablet Take 1 tablet (5 mg total) by mouth 2 (two) times daily. 09/29/18  Yes Bacigalupo, Dionne Bucy, MD  lisinopril (ZESTRIL) 20 MG tablet Take 1 tablet (20 mg total) by mouth daily. 09/01/18  Yes Bacigalupo, Dionne Bucy, MD  memantine (NAMENDA) 5 MG tablet Take 1 tablet by mouth twice daily Patient taking differently: Take 5 mg by mouth 2 (two) times daily.  09/29/18  Yes Bacigalupo, Dionne Bucy, MD  Omega-3 Fatty Acids (FISH OIL) 1000 MG CAPS Take 1,000 mg by mouth daily.    Yes [provider]  vitamin B-12 (CYANOCOBALAMIN) 1000 MCG tablet Take 1,000 mcg by mouth daily.    Yes [provider]    Current Facility-Administered Medications:  .  acetaminophen (TYLENOL) tablet 650 mg, 650 mg, Oral, Q6H PRN **OR** acetaminophen (TYLENOL) suppository 650 mg, 650 mg, Rectal, Q6H PRN, Sudini, Srikar, MD .  albuterol (PROVENTIL) (2.5 MG/3ML) 0.083% nebulizer solution 2.5 mg, 2.5 mg, Nebulization, Q2H PRN, Sudini, Srikar, MD .  ondansetron (ZOFRAN) tablet 4 mg, 4 mg, Oral, Q6H PRN **OR** ondansetron (ZOFRAN) injection 4 mg, 4 mg, Intravenous, Q6H PRN, Hillary Bow, MD .  Derrill Memo ON 11/28/2018] pantoprazole (PROTONIX) injection  40 mg, 40 mg, Intravenous, Q12H, Nance Pear, MD .  polyethylene glycol (MIRALAX / GLYCOLAX) packet 17 g, 17 g, Oral, Daily, , Tally Due, MD, 17 g at 11/25/18 1546 .  sodium bicarbonate 100 mEq in dextrose 5 % 1,000 mL infusion, , Intravenous, Continuous, Sudini, Srikar, MD, Last Rate: 75 mL/hr at 11/25/18 1408  Family History  Problem Relation Age of Onset  . Heart attack Father   . Heart attack Brother   . Heart attack Brother      Social History   Tobacco Use   . Smoking status: Former Smoker    Years: 2.00    Types: Cigarettes    Quit date: 02/16/1944    Years since quitting: 74.8  . Smokeless tobacco: Never Used  Substance Use Topics  . Alcohol use: No  . Drug use: No    Allergies as of 11/25/2018 - Review Complete 11/25/2018  Allergen Reaction Noted  . Codeine Other (See Comments) 07/02/2013    Review of Systems:    All systems reviewed and negative except where noted in HPI.   Physical Exam:  Vital signs in last 24 hours: Temp:  [96.6 F (35.9 C)-98.4 F (36.9 C)] 98.4 F (36.9 C) (10/10 1515) Pulse Rate:  [71-98] 74 (10/10 1515) Resp:  [15-21] 20 (10/10 1515) BP: (97-116)/(41-63) 116/43 (10/10 1515) SpO2:  [84 %-100 %] 98 % (10/10 1515) Weight:  [64.2 kg] 64.2 kg (10/10 0752) Last BM Date: 11/24/18 General:   Pleasant, cooperative in NAD Head:  Normocephalic and atraumatic. Eyes:   No icterus.   Conjunctiva pale. PERRLA. Ears:  Normal auditory acuity. Neck:  Supple; no masses or thyroidomegaly Lungs: Respirations even and unlabored. Lungs clear to auscultation bilaterally.   No wheezes, crackles, or rhonchi.  Heart:  Regular rate and rhythm;  Without murmur, clicks, rubs or gallops Abdomen:  Soft, nondistended, nontender. Normal bowel sounds. No appreciable masses or hepatomegaly.  No rebound or guarding.  Rectal: Solid brown, hard stool balls in the rectal vault Msk:  Symmetrical without gross deformities.  Strength normal Extremities:  Without edema, cyanosis or clubbing. Neurologic:  Alert and oriented x2;  grossly normal neurologically. Skin:  Intact without significant lesions or rashes. Psych:  Alert and cooperative. Normal affect.  LAB RESULTS: CBC Latest Ref Rng & Units 11/25/2018 10/02/2018 09/01/2018  WBC 4.0 - 10.5 K/uL 7.9 8.6 7.9  Hemoglobin 12.0 - 15.0 g/dL 6.6(L) 7.4(L) 8.8(L)  Hematocrit 36.0 - 46.0 % 22.3(L) 22.6(L) 27.3(L)  Platelets 150 - 400 K/uL 372 306 307    BMET BMP Latest Ref Rng & Units  11/25/2018 10/02/2018 09/01/2018  Glucose 70 - 99 mg/dL 135(H) 88 85  BUN 8 - 23 mg/dL 75(H) 33 38(H)  Creatinine 0.44 - 1.00 mg/dL 2.91(H) 1.14(H) 1.43(H)  BUN/Creat Ratio 12 - 28 - 29(H) 27  Sodium 135 - 145 mmol/L 137 142 141  Potassium 3.5 - 5.1 mmol/L 5.5(H) 4.8 5.3(H)  Chloride 98 - 111 mmol/L 117(H) 116(HH) 117(HH)  CO2 22 - 32 mmol/L 8(L) 13(L) 13(L)  Calcium 8.9 - 10.3 mg/dL 9.9 9.4 9.7    LFT Hepatic Function Latest Ref Rng & Units 11/25/2018 10/02/2018 09/01/2018  Total Protein 6.5 - 8.1 g/dL 6.2(L) - 6.1  Albumin 3.5 - 5.0 g/dL 3.5 3.8 4.0  AST 15 - 41 U/L 71(H) - 15  ALT 0 - 44 U/L 19 - 10  Alk Phosphatase 38 - 126 U/L 59 - 74  Total Bilirubin 0.3 - 1.2 mg/dL 0.5 - 0.4  STUDIES: Dg Chest Portable 1 View  Result Date: 11/25/2018 CLINICAL DATA:  Pt arrives via EMS from home after having difficulty breathing starting this morning- states she had a similar episode 2 days ago but did not come to the hospital- per EMS RA sats were 84%- placed pt on nonrebreather at 15L and o.*comment was truncated*Shortness of breath EXAM: PORTABLE CHEST 1 VIEW COMPARISON:  07/31/2013 FINDINGS: Normal cardiac silhouette. Chronic bronchitic markings. No focal consolidation. No pneumothorax. No acute osseous abnormality. IMPRESSION: Chronic bronchitic markings. No acute findings. Electronically Signed   By: Suzy Bouchard M.D.   On: 11/25/2018 08:35      Impression / Plan:   Kerri Ramirez is a 85 y.o. female with history of dementia, hypertension, recent diagnosis of left lower extremity DVT on Eliquis, history of chronic iron deficiency anemia admitted with severe symptomatic anemia as well as AKI.  Patient does not have active GI bleed based on my rectal exam today Patient does not have urgent indication for endoscopic evaluation Discussed in length with patient's daughter regarding the nonurgent need for endoscopic evaluation after adequate resuscitation and iron replacement therapy.   If patient is not responding to iron replacement therapy or if she demonstrates any signs of active GI bleed, pursuing endoscopic evaluation is reasonable.  Patient's daughter expressed understanding of the plan Continue Protonix 40 mg IV twice daily Diet as tolerated IV iron, check serum B12 and folate levels  Chronic constipation Recommend stool softener  Thank you for involving me in the care of this patient.  Dr. Vicente Males will cover tomorrow    LOS: 0 days   Sherri Sear, MD  11/25/2018, 5:57 PM   Note: This dictation was prepared with Dragon dictation along with smaller phrase technology. Any transcriptional errors that result from this process are unintentional.

## 2018-11-25 NOTE — ED Notes (Signed)
X-ray at bedside

## 2018-11-25 NOTE — ED Notes (Signed)
RA sats stable at 100%- pt states her shortness of breath is better than how it felt at home

## 2018-11-25 NOTE — ED Notes (Signed)
Called daughter and gave her an update- told her that she could come visit with the pt

## 2018-11-25 NOTE — ED Triage Notes (Signed)
Pt arrives via EMS from home after having difficulty breathing starting this morning- states she had a similar episode 2 days ago but did not come to the hospital- per EMS RA sats were 84%- placed pt on nonrebreather at 15L and o2 sats were at 93%- pt A&O x4

## 2018-11-26 ENCOUNTER — Inpatient Hospital Stay: Payer: Medicare Other

## 2018-11-26 DIAGNOSIS — D649 Anemia, unspecified: Secondary | ICD-10-CM

## 2018-11-26 LAB — VITAMIN B12: Vitamin B-12: 361 pg/mL (ref 180–914)

## 2018-11-26 LAB — BPAM RBC
Blood Product Expiration Date: 202010272359
Blood Product Expiration Date: 202010282359
ISSUE DATE / TIME: 202010101034
ISSUE DATE / TIME: 202010101453
Unit Type and Rh: 600
Unit Type and Rh: 600

## 2018-11-26 LAB — TYPE AND SCREEN
ABO/RH(D): A NEG
Antibody Screen: NEGATIVE
Unit division: 0
Unit division: 0

## 2018-11-26 LAB — CBC
HCT: 23.7 % — ABNORMAL LOW (ref 36.0–46.0)
Hemoglobin: 7.7 g/dL — ABNORMAL LOW (ref 12.0–15.0)
MCH: 27.1 pg (ref 26.0–34.0)
MCHC: 32.5 g/dL (ref 30.0–36.0)
MCV: 83.5 fL (ref 80.0–100.0)
Platelets: 283 10*3/uL (ref 150–400)
RBC: 2.84 MIL/uL — ABNORMAL LOW (ref 3.87–5.11)
RDW: 15.4 % (ref 11.5–15.5)
WBC: 6.8 10*3/uL (ref 4.0–10.5)
nRBC: 0 % (ref 0.0–0.2)

## 2018-11-26 LAB — BASIC METABOLIC PANEL
Anion gap: 3 — ABNORMAL LOW (ref 5–15)
BUN: 61 mg/dL — ABNORMAL HIGH (ref 8–23)
CO2: 15 mmol/L — ABNORMAL LOW (ref 22–32)
Calcium: 9.3 mg/dL (ref 8.9–10.3)
Chloride: 119 mmol/L — ABNORMAL HIGH (ref 98–111)
Creatinine, Ser: 2.15 mg/dL — ABNORMAL HIGH (ref 0.44–1.00)
GFR calc Af Amer: 23 mL/min — ABNORMAL LOW (ref >60)
GFR calc non Af Amer: 20 mL/min — ABNORMAL LOW (ref >60)
Glucose, Bld: 97 mg/dL (ref 70–99)
Potassium: 5.3 mmol/L — ABNORMAL HIGH (ref 3.5–5.1)
Sodium: 137 mmol/L (ref 135–145)

## 2018-11-26 MED ORDER — PANTOPRAZOLE SODIUM 40 MG IV SOLR
40.0000 mg | Freq: Two times a day (BID) | INTRAVENOUS | Status: DC
  Administered 2018-11-26 (×2): 40 mg via INTRAVENOUS
  Filled 2018-11-26 (×2): qty 40

## 2018-11-26 MED ORDER — SODIUM POLYSTYRENE SULFONATE 15 GM/60ML PO SUSP
30.0000 g | Freq: Once | ORAL | Status: AC
  Administered 2018-11-26: 30 g via ORAL
  Filled 2018-11-26: qty 120

## 2018-11-26 MED ORDER — SODIUM BICARBONATE 8.4 % IV SOLN
INTRAVENOUS | Status: AC
  Administered 2018-11-26 – 2018-11-27 (×2): via INTRAVENOUS
  Filled 2018-11-26 (×3): qty 100

## 2018-11-26 NOTE — Progress Notes (Signed)
[facility] [place] NAME: Kerri Ramirez    MR#:  [id number]  DATE OF BIRTH:  [birth year]  SUBJECTIVE:  CHIEF COMPLAINT:   Chief Complaint  Patient presents with  . Shortness of Breath   Confused intermittently. No concerns  REVIEW OF SYSTEMS:    Review of Systems  Unable to perform ROS: Dementia   DRUG ALLERGIES:   Allergies  Allergen Reactions  . Codeine Other (See Comments)    Made head spin     VITALS:  Blood pressure (!) 107/49, pulse 76, temperature 98.3 F (36.8 C), resp. rate 17, height 5\' 5"  (1.651 m), weight 64.2 kg, SpO2 100 %.  PHYSICAL EXAMINATION:   Physical Exam  GENERAL:  85 y.o.-year-old patient lying in the bed with no acute distress.  EYES: Pupils equal, round, reactive to light and accommodation. No scleral icterus. Extraocular muscles intact.  HEENT: Head atraumatic, normocephalic. Oropharynx and nasopharynx clear.  NECK:  Supple, no jugular venous distention. No thyroid enlargement, no tenderness.  LUNGS: Normal breath sounds bilaterally, no wheezing, rales, rhonchi. No use of accessory muscles of respiration.  CARDIOVASCULAR: S1, S2 normal. No murmurs, rubs, or gallops.  ABDOMEN: Soft, nontender, nondistended. Bowel sounds present. No organomegaly or mass.  EXTREMITIES: No cyanosis, clubbing or edema b/l.    NEUROLOGIC: Cranial nerves II through XII are intact. No focal Motor or sensory deficits b/l.   PSYCHIATRIC: The patient is alert and awake. confused SKIN: No obvious rash, lesion, or ulcer.   LABORATORY PANEL:   CBC Recent Labs  Lab 11/26/18 0355  WBC 6.8  HGB 7.7*  HCT 23.7*  PLT 283   ------------------------------------------------------------------------------------------------------------------ Chemistries  Recent Labs  Lab 11/25/18 0757  11/26/18 0355  NA 137   < > 137  K 5.5*   < > 5.3*  CL 117*   < > 119*  CO2 8*   < > 15*  GLUCOSE 135*   < > 97  BUN 75*   < > 61*   CREATININE 2.91*   < > 2.15*  CALCIUM 9.9   < > 9.3  AST 71*  --   --   ALT 19  --   --   ALKPHOS 59  --   --   BILITOT 0.5  --   --    < > = values in this interval not displayed.   ------------------------------------------------------------------------------------------------------------------  Cardiac Enzymes No results for input(s): TROPONINI in the last 168 hours. ------------------------------------------------------------------------------------------------------------------  RADIOLOGY:  Dg Chest Portable 1 View  Result Date: 11/25/2018 CLINICAL DATA:  Pt arrives via EMS from home after having difficulty breathing starting this morning- states she had a similar episode 2 days ago but did not come to the hospital- per EMS RA sats were 84%- placed pt on nonrebreather at 15L and o.*comment was truncated*Shortness of breath EXAM: PORTABLE CHEST 1 VIEW COMPARISON:  07/31/2013 FINDINGS: Normal cardiac silhouette. Chronic bronchitic markings. No focal consolidation. No pneumothorax. No acute osseous abnormality. IMPRESSION: Chronic bronchitic markings. No acute findings. Electronically Signed   By: Suzy Bouchard M.D.   On: 11/25/2018 08:35   ASSESSMENT AND PLAN:   * GI bleed. Likely acute on chronic GI losses  On Protonix IV GI consulted and discussed with Dr. Marius Ditch. Advised OP w/u Hb dropped more today. Monitor Hold Eliquis Full liquids per GI  *Hypertension.  Hold home medications.  Blood pressure low normal.  *Acute kidney injury likely secondary to hypotension/GI bleed and decreased perfusion. Improving  with IVF  *Hyperkalemia secondary to acute kidney injury.   Improving Will give Kayexalate today  *Dementia.  Monitor for inpatient delirium.  All the records are reviewed and case discussed with Care Management/Social Worker Management plans discussed with the patient, family and they are in agreement.  CODE STATUS: FULL CODE  DVT Prophylaxis: SCDs  TOTAL  TIME TAKING CARE OF THIS PATIENT: 35 minutes.   POSSIBLE D/C IN 1-2 DAYS, DEPENDING ON CLINICAL CONDITION.  Molinda Bailiff Breyon Sigg M.D on 11/26/2018 at 11:51 AM  Between 7am to 6pm - Pager - [phone]  After 6pm go to [email or web address] - password [email or web address] [facility]  [facility]  Office  [phone]  CC: Primary care physician; Erasmo Downer, MD  Note: This dictation was prepared with Dragon dictation along with smaller phrase technology. Any transcriptional errors that result from this process are unintentional.

## 2018-11-26 NOTE — Progress Notes (Signed)
Wyline Mood , MD [address], [address], [place], [place], [address] [address], [place], [place], [address] Phone: [phone]  Fax: [phone]   DEJAI SCHUBACH is being followed for severe anemia day 1 of follow up   Subjective: No complaints   Objective: Vital signs in last 24 hours: Vitals:   11/25/18 1805 11/25/18 1921 11/26/18 0333 11/26/18 0608  BP: (!) 149/53 (!) 109/43 (!) 167/113 (!) 107/49  Pulse: 88 74 80 76  Resp: 20 16 17    Temp: 98.4 F (36.9 C) 98.2 F (36.8 C) 98.3 F (36.8 C)   TempSrc: Oral Oral    SpO2: 100% 100% 100% 100%  Weight:      Height:       Weight change:   Intake/Output Summary (Last 24 hours) at 11/26/2018 1053 Last data filed at 11/26/2018 0816 Gross per 24 hour  Intake 1475.33 ml  Output 604 ml  Net 871.33 ml     Exam: Heart:: Regular rate and rhythm, S1S2 present or without murmur or extra heart sounds Lungs: normal, clear to auscultation and clear to auscultation and percussion Abdomen: soft, nontender, normal bowel sounds  CBC Latest Ref Rng & Units 11/26/2018 11/25/2018 11/25/2018  WBC 4.0 - 10.5 K/uL 6.8 - 7.9  Hemoglobin 12.0 - 15.0 g/dL 7.7(L) 8.7(L) 6.6(L)  Hematocrit 36.0 - 46.0 % 23.7(L) - 22.3(L)  Platelets 150 - 400 K/uL 283 - 372    Lab Results: @LABTEST2 @ Micro Results: Recent Results (from the past 240 hour(s))  SARS CORONAVIRUS 2 (TAT 6-24 HRS) Nasopharyngeal Nasopharyngeal Swab     Status: None   Collection Time: 11/25/18  8:58 AM   Specimen: Nasopharyngeal Swab  Result Value Ref Range Status   SARS Coronavirus 2 NEGATIVE NEGATIVE Final    Comment: (NOTE) SARS-CoV-2 target nucleic acids are NOT DETECTED. The SARS-CoV-2 RNA is generally detectable in upper and lower respiratory specimens during the acute phase of infection. Negative results do not preclude SARS-CoV-2 infection, do not rule out co-infections with other pathogens, and should not be used as the sole basis for  treatment or other patient management decisions. Negative results must be combined with clinical observations, patient history, and epidemiological information. The expected result is Negative. Fact Sheet for Patients: Fact Sheet for Healthcare Providers: 01/25/19 This test is not yet approved or cleared by the [employer] FDA and  has been authorized for detection and/or diagnosis of SARS-CoV-2 by FDA under an Emergency Use Authorization (EUA). This EUA will remain  in effect (meaning this test can be used) for the duration of the COVID-19 declaration under Section 56 4(b)(1) of the Act, 21 U.S.C. section 360bbb-3(b)(1), unless the authorization is terminated or revoked sooner. Performed at [facility], [address], [place], [address] [place]    Studies/Results: Dg Chest Portable 1 View  Result Date: 11/25/2018 CLINICAL DATA:  Pt arrives via EMS from home after having difficulty breathing starting this morning- states she had a similar episode 2 days ago but did not come to the hospital- per EMS RA sats were 84%- placed pt on nonrebreather at 15L and o.*comment was truncated*Shortness of breath EXAM: PORTABLE CHEST 1 VIEW COMPARISON:  07/31/2013 FINDINGS: Normal cardiac silhouette. Chronic bronchitic markings. No focal consolidation. No pneumothorax. No acute osseous abnormality. IMPRESSION: Chronic bronchitic markings. No acute findings. Electronically Signed   By: 01/25/2019 M.D.   On: 11/25/2018 08:35   Medications: I have reviewed the patient's current medications. Scheduled Meds: .  pantoprazole  40 mg Intravenous Q12H  . polyethylene glycol  17 g Oral Daily  . sodium polystyrene  30 g Oral Once   Continuous Infusions: .  sodium bicarbonate  infusion 1000 mL 75 mL/hr at 11/26/18 0806   PRN Meds:.acetaminophen **OR** acetaminophen, albuterol, ondansetron **OR** ondansetron (ZOFRAN) IV    Assessment: Active Problems:   Upper GI bleed   Pamlea Finder Laskin 85 y.o. on Eliquis for DVT admitted with symptomatic severe anemia as well as acute kidney injury.  Patient did not want any endoscopic evaluation yesterday.  Ferritin is low at 14 and she would benefit from some IV iron.  B12 levels are normal as well as folate levels.      LOS: 1 day   Jonathon Bellows, MD 11/26/2018, 10:53 AM

## 2018-11-27 DIAGNOSIS — K922 Gastrointestinal hemorrhage, unspecified: Principal | ICD-10-CM

## 2018-11-27 DIAGNOSIS — I4430 Unspecified atrioventricular block: Secondary | ICD-10-CM

## 2018-11-27 LAB — CBC WITH DIFFERENTIAL/PLATELET
Abs Immature Granulocytes: 0.02 10*3/uL (ref 0.00–0.07)
Basophils Absolute: 0.1 10*3/uL (ref 0.0–0.1)
Basophils Relative: 1 %
Eosinophils Absolute: 0.3 10*3/uL (ref 0.0–0.5)
Eosinophils Relative: 5 %
HCT: 24.6 % — ABNORMAL LOW (ref 36.0–46.0)
Hemoglobin: 7.9 g/dL — ABNORMAL LOW (ref 12.0–15.0)
Immature Granulocytes: 0 %
Lymphocytes Relative: 20 %
Lymphs Abs: 1.3 10*3/uL (ref 0.7–4.0)
MCH: 26.7 pg (ref 26.0–34.0)
MCHC: 32.1 g/dL (ref 30.0–36.0)
MCV: 83.1 fL (ref 80.0–100.0)
Monocytes Absolute: 0.5 10*3/uL (ref 0.1–1.0)
Monocytes Relative: 8 %
Neutro Abs: 4.2 10*3/uL (ref 1.7–7.7)
Neutrophils Relative %: 66 %
Platelets: 276 10*3/uL (ref 150–400)
RBC: 2.96 MIL/uL — ABNORMAL LOW (ref 3.87–5.11)
RDW: 15.6 % — ABNORMAL HIGH (ref 11.5–15.5)
WBC: 6.3 10*3/uL (ref 4.0–10.5)
nRBC: 0 % (ref 0.0–0.2)

## 2018-11-27 LAB — BASIC METABOLIC PANEL
Anion gap: 1 — ABNORMAL LOW (ref 5–15)
BUN: 42 mg/dL — ABNORMAL HIGH (ref 8–23)
CO2: 20 mmol/L — ABNORMAL LOW (ref 22–32)
Calcium: 9.1 mg/dL (ref 8.9–10.3)
Chloride: 118 mmol/L — ABNORMAL HIGH (ref 98–111)
Creatinine, Ser: 1.48 mg/dL — ABNORMAL HIGH (ref 0.44–1.00)
GFR calc Af Amer: 36 mL/min — ABNORMAL LOW (ref >60)
GFR calc non Af Amer: 31 mL/min — ABNORMAL LOW (ref >60)
Glucose, Bld: 102 mg/dL — ABNORMAL HIGH (ref 70–99)
Potassium: 4.2 mmol/L (ref 3.5–5.1)
Sodium: 139 mmol/L (ref 135–145)

## 2018-11-27 LAB — TSH: TSH: 1.83 u[IU]/mL (ref 0.350–4.500)

## 2018-11-27 LAB — MAGNESIUM: Magnesium: 2.1 mg/dL (ref 1.7–2.4)

## 2018-11-27 MED ORDER — SODIUM CHLORIDE 0.9 % IV SOLN
510.0000 mg | Freq: Once | INTRAVENOUS | Status: AC
  Administered 2018-11-27: 08:00:00 510 mg via INTRAVENOUS
  Filled 2018-11-27: qty 17

## 2018-11-27 MED ORDER — PANTOPRAZOLE SODIUM 40 MG PO TBEC
40.0000 mg | DELAYED_RELEASE_TABLET | Freq: Two times a day (BID) | ORAL | Status: DC
  Administered 2018-11-27 – 2018-11-28 (×3): 40 mg via ORAL
  Filled 2018-11-27 (×3): qty 1

## 2018-11-27 NOTE — Progress Notes (Signed)
Kerri Bouillon, MD [address], [address], [place], [place], [address] [address], [address], [place], [place], [address] Phone: [phone]  Fax: [phone]   Subjective:  Patient resting in bed comfortably.  Daughter at bedside.  No acute issues.  Objective: Exam: Vital signs in last 24 hours: Vitals:   11/26/18 1918 11/26/18 1924 11/27/18 0401 11/27/18 0839  BP: (!) 111/39 (!) 126/53 (!) 120/49 125/84  Pulse: 70 70 77 82  Resp: 16  16 14   Temp: 98.2 F (36.8 C)  97.6 F (36.4 C) 98.4 F (36.9 C)  TempSrc: Oral  Oral Oral  SpO2: 100%  100% 99%  Weight:      Height:       Weight change:   Intake/Output Summary (Last 24 hours) at 11/27/2018 1205 Last data filed at 11/27/2018 0900 Gross per 24 hour  Intake 1108.4 ml  Output --  Net 1108.4 ml    General: No acute distress, AAO x3 Abd: Soft, NT/ND, No HSM Skin: Warm, no rashes Neck: Supple, Trachea midline   Lab Results: Lab Results  Component Value Date   WBC 6.3 11/27/2018   HGB 7.9 (L) 11/27/2018   HCT 24.6 (L) 11/27/2018   MCV 83.1 11/27/2018   PLT 276 11/27/2018   Micro Results: Recent Results (from the past 240 hour(s))  SARS CORONAVIRUS 2 (TAT 6-24 HRS) Nasopharyngeal Nasopharyngeal Swab     Status: None   Collection Time: 11/25/18  8:58 AM   Specimen: Nasopharyngeal Swab  Result Value Ref Range Status   SARS Coronavirus 2 NEGATIVE NEGATIVE Final    Comment: (NOTE) SARS-CoV-2 target nucleic acids are NOT DETECTED. The SARS-CoV-2 RNA is generally detectable in upper and lower respiratory specimens during the acute phase of infection. Negative results do not preclude SARS-CoV-2 infection, do not rule out co-infections with other pathogens, and should not be used as the sole basis for treatment or other patient management decisions. Negative results must be combined with clinical observations, patient history, and epidemiological information. The expected result is Negative. Fact  Sheet for Patients: 01/25/19 Fact Sheet for Healthcare Providers: [employer] This test is not yet approved or cleared by the [employer] FDA and  has been authorized for detection and/or diagnosis of SARS-CoV-2 by FDA under an Emergency Use Authorization (EUA). This EUA will remain  in effect (meaning this test can be used) for the duration of the COVID-19 declaration under Section 56 4(b)(1) of the Act, 21 U.S.C. section 360bbb-3(b)(1), unless the authorization is terminated or revoked sooner. Performed at [facility], [address] [place], [place]    Studies/Results: [address] Venous Img Lower Unilateral Left  Result Date: 11/27/2018 CLINICAL DATA:  85 year old female with a history of possible DVT EXAM: LEFT LOWER EXTREMITY VENOUS DOPPLER ULTRASOUND TECHNIQUE: Gray-scale sonography with graded compression, as well as color Doppler and duplex ultrasound were performed to evaluate the lower extremity deep venous systems from the level of the common femoral vein and including the common femoral, femoral, profunda femoral, popliteal and calf veins including the posterior tibial, peroneal and gastrocnemius veins when visible. The superficial great saphenous vein was also interrogated. Spectral Doppler was utilized to evaluate flow at rest and with distal augmentation maneuvers in the common femoral, femoral and popliteal veins. COMPARISON:  None. FINDINGS: Contralateral Common Femoral Vein: Respiratory phasicity is normal and symmetric with the symptomatic side. No evidence of thrombus. Normal compressibility. Common Femoral Vein: No evidence of thrombus. Normal compressibility, respiratory phasicity and response to  augmentation. Saphenofemoral Junction: No evidence of thrombus. Normal compressibility and flow on color Doppler imaging. Profunda Femoral Vein: No evidence of thrombus. Normal compressibility and flow on  color Doppler imaging. Femoral Vein: No evidence of thrombus. Normal compressibility, respiratory phasicity and response to augmentation. Popliteal Vein: No evidence of thrombus. Normal compressibility, respiratory phasicity and response to augmentation. Calf Veins: No evidence of thrombus. Normal compressibility and flow on color Doppler imaging. Superficial Great Saphenous Vein: No evidence of thrombus. Normal compressibility and flow on color Doppler imaging. Other Findings:  None. IMPRESSION: Sonographic survey of the left lower extremity negative for DVT Electronically Signed   By: Corrie Mckusick D.O.   On: 11/27/2018 07:45   US Venous Img Lower Unilateral Right  Result Date: 11/26/2018 CLINICAL DATA:  History of left common femoral DVT. Right calf pain. EXAM: RIGHT LOWER EXTREMITY VENOUS DOPPLER ULTRASOUND TECHNIQUE: Gray-scale sonography with compression, as well as color and duplex ultrasound, were performed to evaluate the deep venous system from the level of the common femoral vein through the popliteal and proximal calf veins. COMPARISON:  09/01/2018 FINDINGS: Normal compressibility of the common femoral, superficial femoral, and popliteal veins, as well as the proximal calf veins. No filling defects to suggest DVT on grayscale or color Doppler imaging. Doppler waveforms show normal direction of venous flow, normal respiratory phasicity and response to augmentation. Survey views of the contralateral common femoral vein are unremarkable. Patchy femoral-popliteal arterial calcifications incidentally noted. IMPRESSION: No femoropopliteal and no calf DVT in the visualized calf veins on the RIGHT. If clinical symptoms are inconsistent or if there are persistent or worsening symptoms, further imaging (possibly involving the iliac veins) may be warranted. Electronically Signed   By: Lucrezia Europe M.D.   On: 11/26/2018 13:26   Medications:  Scheduled Meds:  pantoprazole  40 mg Oral BID   polyethylene  glycol  17 g Oral Daily   Continuous Infusions:   sodium bicarbonate  infusion 1000 mL 75 mL/hr at 11/26/18 1933   PRN Meds:.acetaminophen **OR** acetaminophen, albuterol, ondansetron **OR** ondansetron (ZOFRAN) IV   Assessment: Anemia   Plan: No active GI bleeding Family present at bedside and as stated in previous notes by Dr. Vicente Males Dr. Marius Ditch, they prefer to not undergo invasive procedures given that she has not had any active bleeding  Would recommend primary team to further discuss with the specialist who prescribes her the Eliquis.  If Eliquis needs to be continued in this patient given her previous history of DVT that has now resolved, Weigh risks and benefits of continuing the medication.  If endoscopic procedures are needed in order to continue the medication, risks and benefits of the procedure will need to be weighed with the patient and family prior to any procedures.   LOS: 2 days   Vonda Antigua, MD 11/27/2018, 12:05 PM

## 2018-11-27 NOTE — Clinical Social Work Note (Signed)
Patient is active with [facility] for nursing. Start of care was 10/7. [facility] representative has been notified of potential discharge home tomorrow.  Dayton Scrape, [place]

## 2018-11-27 NOTE — Consult Note (Signed)
Cardiology Consultation:   Patient ID: Kerri EeMargaret V Ramirez; [id number]; [birth year]   Admit date: 11/25/2018 Date of Consult: 11/27/2018  Primary Care Provider: Erasmo DownerBacigalupo, Angela M, MD Primary Cardiologist: [facility] - consult by Kirke CorinArida   [name] is a 85 y.o. female with a hx of DVT on Eliquis, CKD stage III, HTN, Alzheimer's dementia, anemia, and colon cancer in 1999 who is being seen today for the evaluation of sinus pause at the request of Dr. Arville CareMansy.  History of Present Illness:   Kerri Ramirez was previously followed up Dr. Lady GaryFath for HTN, though has been lost to cardiology follow up over the past 4 years. No prior echo or ischemic evaluations for review.   She presented from her nursing home on 10/10 with increase SOB. She was found to have a HGB of 6.6 (noted to be 8.8 two months prior) and be hemoccult positive. She received 2 units of pRBC with a HGB of 7.9 this morning. She was also noted to have AKI with a BUN/SCr of 75/2.91 (baseline SCr 1.1-1.4) and potassium of 5.5 and trended to 42/1/48 with a potassium of 4.2 this morning. COVID-19 negative. Vitals stable. EKG showed NSR with 1st degree AV block and a new LBBB. Lower extremity ultrasound negative for DVT. CXR showed chronic bronchitis.  Cardiology is asked to evaluate pauses noted on telemetry.  Patient denies any symptoms of dizziness, presyncope, or syncope. She is alert to person only.   Past Medical History:  Diagnosis Date  . Dementia (HCC)   . DVT (deep vein thrombosis) in pregnancy   . Hypertension     Past Surgical History:  Procedure Laterality Date  . ANKLE SURGERY Left 1981  . BACK SURGERY  1987   broken vertebra  . COLONOSCOPY     Dr Mechele CollinElliott  . HEMICOLECTOMY Right 07-25-13   Dr Byrd/diverticulitis  . IVC FILTER PLACEMENT ([facility] HX)  09/01/2013     Home Meds: Prior to Admission medications   Medication Sig Start Date End Date Taking? Authorizing Provider  apixaban  (ELIQUIS) 5 MG TABS tablet Take 1 tablet (5 mg total) by mouth 2 (two) times daily. 09/29/18  Yes Bacigalupo, Marzella SchleinAngela M, MD  lisinopril (ZESTRIL) 20 MG tablet Take 1 tablet (20 mg total) by mouth daily. 09/01/18  Yes Bacigalupo, Marzella SchleinAngela M, MD  memantine (NAMENDA) 5 MG tablet Take 1 tablet by mouth twice daily Patient taking differently: Take 5 mg by mouth 2 (two) times daily.  09/29/18  Yes Bacigalupo, Marzella SchleinAngela M, MD  Omega-3 Fatty Acids (FISH OIL) 1000 MG CAPS Take 1,000 mg by mouth daily.    Yes [provider]  vitamin B-12 (CYANOCOBALAMIN) 1000 MCG tablet Take 1,000 mcg by mouth daily.    Yes [provider]    Inpatient Medications: Scheduled Meds: . pantoprazole  40 mg Intravenous Q12H  . polyethylene glycol  17 g Oral Daily   Continuous Infusions: . ferumoxytol 510 mg (11/27/18 0808)  .  sodium bicarbonate  infusion 1000 mL 75 mL/hr at 11/26/18 1933   PRN Meds: acetaminophen **OR** acetaminophen, albuterol, ondansetron **OR** ondansetron (ZOFRAN) IV  Allergies:   Allergies  Allergen Reactions  . Codeine Other (See Comments)    Made head spin     Social History:   Social History   Socioeconomic History  . Marital status: Married    Spouse name: Not on file  . Number of children: 3  . Years of education: Not on  file  . Highest education level: 6th grade  Occupational History  . Occupation: retired  Scientific laboratory technician  . Financial resource strain: Not hard at all  . Food insecurity    Worry: Never true    Inability: Never true  . Transportation needs    Medical: No    Non-medical: No  Tobacco Use  . Smoking status: Former Smoker    Years: 2.00    Types: Cigarettes    Quit date: 02/16/1944    Years since quitting: 74.8  . Smokeless tobacco: Never Used  Substance and Sexual Activity  . Alcohol use: No  . Drug use: No  . Sexual activity: Not on file  Lifestyle  . Physical activity    Days per week: 0 days    Minutes per session: 0 min  . Stress: Not at  all  Relationships  . Social Herbalist on phone: Patient refused    Gets together: Patient refused    Attends religious service: Patient refused    Active member of club or organization: Patient refused    Attends meetings of clubs or organizations: Patient refused    Relationship status: Patient refused  . Intimate partner violence    Fear of current or ex partner: No    Emotionally abused: No    Physically abused: No    Forced sexual activity: No  Other Topics Concern  . Not on file  Social History Narrative   1 son deceased- suicide     Family History:   Family History  Problem Relation Age of Onset  . Heart attack Father   . Heart attack Brother   . Heart attack Brother     ROS:  Review of Systems  Unable to perform ROS: Dementia      Physical Exam/Data:   Vitals:   11/26/18 1538 11/26/18 1918 11/26/18 1924 11/27/18 0401  BP: (!) 100/42 (!) 111/39 (!) 126/53 (!) 120/49  Pulse: 74 70 70 77  Resp: 18 16  16   Temp: 98.6 F (37 C) 98.2 F (36.8 C)  97.6 F (36.4 C)  TempSrc: Oral Oral  Oral  SpO2: 100% 100%  100%  Weight:      Height:        Intake/Output Summary (Last 24 hours) at 11/27/2018 2505 Last data filed at 11/27/2018 0304 Gross per 24 hour  Intake 748.4 ml  Output -  Net 748.4 ml   Filed Weights   11/25/18 0752  Weight: 64.2 kg   Body mass index is 23.55 kg/m.   Physical Exam: General: Elderly appearing; well developed, well nourished, in no acute distress. Head: Normocephalic, atraumatic, sclera non-icteric, no xanthomas, nares without discharge.  Neck: Negative for carotid bruits. JVD not elevated. Lungs: Clear bilaterally to auscultation without wheezes, rales, or rhonchi. Breathing is unlabored. Heart: RRR with S1 S2. II/VI systolic murmur LUSB, no rubs, or gallops appreciated. Abdomen: Soft, non-tender, non-distended with normoactive bowel sounds. No hepatomegaly. No rebound/guarding. No obvious abdominal masses. Msk:   Strength and tone appear normal for age. Extremities: No clubbing or cyanosis. No edema. Distal pedal pulses are 2+ and equal bilaterally. Neuro: Alert and oriented to person only. No facial asymmetry. No focal deficit. Moves all extremities spontaneously. Psych:  Responds to questions appropriately with a normal affect.   EKG:  The EKG was personally reviewed and demonstrates: NSR, first degree AV block, 97 bpm, LBBB (new, onset date uincertain) Telemetry:  Telemetry was personally reviewed and demonstrates: SR with  1st degree AV block and intermittent 2:1 AV block, BBB  Weights: Filed Weights   11/25/18 0752  Weight: 64.2 kg    Relevant CV Studies: none  Laboratory Data:  Chemistry Recent Labs  Lab 11/25/18 1827 11/26/18 0355 11/27/18 0434  NA 137 137 139  K 5.8* 5.3* 4.2  CL 119* 119* 118*  CO2 13* 15* 20*  GLUCOSE 112* 97 102*  BUN 70* 61* 42*  CREATININE 2.62* 2.15* 1.48*  CALCIUM 9.6 9.3 9.1  GFRNONAA 15* 20* 31*  GFRAA 18* 23* 36*  ANIONGAP 5 3* 1*    Recent Labs  Lab 11/25/18 0757  PROT 6.2*  ALBUMIN 3.5  AST 71*  ALT 19  ALKPHOS 59  BILITOT 0.5   Hematology Recent Labs  Lab 11/25/18 0757 11/25/18 1827 11/26/18 0355 11/27/18 0434  WBC 7.9  --  6.8 6.3  RBC 2.50*  --  2.84* 2.96*  HGB 6.6* 8.7* 7.7* 7.9*  HCT 22.3*  --  23.7* 24.6*  MCV 89.2  --  83.5 83.1  MCH 26.4  --  27.1 26.7  MCHC 29.6*  --  32.5 32.1  RDW 15.0  --  15.4 15.6*  PLT 372  --  283 276   Cardiac EnzymesNo results for input(s): TROPONINI in the last 168 hours. No results for input(s): TROPIPOC in the last 168 hours.  BNPNo results for input(s): BNP, PROBNP in the last 168 hours.  DDimer No results for input(s): DDIMER in the last 168 hours.  Radiology/Studies:  US Venous Img Lower Unilateral Left  Result Date: 11/27/2018 IMPRESSION: Sonographic survey of the left lower extremity negative for DVT Electronically Signed   By: Gilmer Mor D.O.   On: 11/27/2018 07:45    US Venous Img Lower Unilateral Right  Result Date: 11/26/2018 IMPRESSION: No femoropopliteal and no calf DVT in the visualized calf veins on the RIGHT. If clinical symptoms are inconsistent or if there are persistent or worsening symptoms, further imaging (possibly involving the iliac veins) may be warranted. Electronically Signed   By: Corlis Leak M.D.   On: 11/26/2018 13:26   Dg Chest Portable 1 View  Result Date: 11/25/2018 IMPRESSION: Chronic bronchitic markings. No acute findings. Electronically Signed   By: Genevive Bi M.D.   On: 11/25/2018 08:35    Assessment and Plan:   1. Intermittent 2:1 AV block: -Asymptomatic -Possibly in the setting of her electrolyte abnormality with hyperkalemia -Monitor on telemetry  -Avoid AV nodal blocking medications -Check magnesium and TSH  2. New onset LBBB: -Date of onset uncertain -Check echo -No symptoms of ACS -Would not recommend invasive ischemic evaluation at this time given lack of anginal symptoms, acute blood loss anemia requiring transfusion, AKI, and dementia   3. GI bleed/acute blood loss anemia: -Status post 2 units of pRBCs -Eliquis on hold  -Per IM  4. Acute on CKD stage III: -Likely in the setting of the above -Improved and back to ~ baseline  5. Hyperkalemia: -Likely in the setting of AKI -Hold lisinopril   6. Prior DVT: -Imaging negative this admission -Eliquis on hold given anemia   7. Alzheimer's dementia: -Namenda   For questions or updates, please contact [facility] Please consult [email or web address] for contact info under Cardiology/STEMI.   Signed, Eula Listen, PA-C [facility] Pager: [phone] 11/27/2018, 8:22 AM

## 2018-11-27 NOTE — Progress Notes (Signed)
[employer] - [place] [facility]   PATIENT NAME: Kerri Ramirez    MR#:  [id number]  DATE OF BIRTH:  Dec 26, 1925  SUBJECTIVE:   Patient states she is feeling fine this morning.  She denies any blood in her stool.  She denies any chest pain, shortness of breath, or palpitations.  She is overall feeling somewhat weak.  Confusion overall seems a little better.  REVIEW OF SYSTEMS:    Review of Systems  Constitutional: Positive for malaise/fatigue. Negative for chills and fever.  HENT: Negative for congestion and sore throat.   Eyes: Negative for blurred vision and double vision.  Respiratory: Negative for cough and shortness of breath.   Cardiovascular: Negative for chest pain and palpitations.  Gastrointestinal: Negative for nausea and vomiting.  Genitourinary: Negative for dysuria and urgency.  Musculoskeletal: Negative for back pain and neck pain.  Neurological: Positive for weakness. Negative for dizziness, focal weakness and headaches.  Psychiatric/Behavioral: Negative for depression. The patient is not nervous/anxious.    DRUG ALLERGIES:   Allergies  Allergen Reactions  . Codeine Other (See Comments)    Made head spin     VITALS:  Blood pressure 125/84, pulse 82, temperature 98.4 F (36.9 C), temperature source Oral, resp. rate 14, height 5\' 5"  (1.651 m), weight 64.2 kg, SpO2 99 %.  PHYSICAL EXAMINATION:   Physical Exam  GENERAL:  85 y.o.-year-old patient lying in the bed with no acute distress.  EYES: Pupils equal, round, reactive to light and accommodation. No scleral icterus. Extraocular muscles intact.  HEENT: Head atraumatic, normocephalic. Oropharynx and nasopharynx clear.  NECK:  Supple, no jugular venous distention. No thyroid enlargement, no tenderness.  LUNGS: Normal breath sounds bilaterally, no wheezing, rales, rhonchi. No use of accessory muscles of respiration.  CARDIOVASCULAR: RRR, S1, S2 normal. No murmurs, rubs, or gallops.   ABDOMEN: Soft, nontender, nondistended. Bowel sounds present. No organomegaly or mass.  EXTREMITIES: No cyanosis, clubbing or edema b/l.  NEUROLOGIC: Cranial nerves II through XII are intact. No focal Motor or sensory deficits b/l.   PSYCHIATRIC: The patient is alert and awake. confused SKIN: No obvious rash, lesion, or ulcer.   LABORATORY PANEL:   CBC Recent Labs  Lab 11/27/18 0434  WBC 6.3  HGB 7.9*  HCT 24.6*  PLT 276   ------------------------------------------------------------------------------------------------------------------ Chemistries  Recent Labs  Lab 11/25/18 0757  11/27/18 0434  NA 137   < > 139  K 5.5*   < > 4.2  CL 117*   < > 118*  CO2 8*   < > 20*  GLUCOSE 135*   < > 102*  BUN 75*   < > 42*  CREATININE 2.91*   < > 1.48*  CALCIUM 9.9   < > 9.1  AST 71*  --   --   ALT 19  --   --   ALKPHOS 59  --   --   BILITOT 0.5  --   --    < > = values in this interval not displayed.   ------------------------------------------------------------------------------------------------------------------  Cardiac Enzymes No results for input(s): TROPONINI in the last 168 hours. ------------------------------------------------------------------------------------------------------------------  RADIOLOGY:  01/27/19 Venous Img Lower Unilateral Left  Result Date: 11/27/2018 CLINICAL DATA:  85 year old female with a history of possible DVT EXAM: LEFT LOWER EXTREMITY VENOUS DOPPLER ULTRASOUND TECHNIQUE: Gray-scale sonography with graded compression, as well as color Doppler and duplex ultrasound were performed to evaluate the lower extremity deep venous systems from the level of the common femoral vein and  including the common femoral, femoral, profunda femoral, popliteal and calf veins including the posterior tibial, peroneal and gastrocnemius veins when visible. The superficial great saphenous vein was also interrogated. Spectral Doppler was utilized to evaluate flow at rest and  with distal augmentation maneuvers in the common femoral, femoral and popliteal veins. COMPARISON:  None. FINDINGS: Contralateral Common Femoral Vein: Respiratory phasicity is normal and symmetric with the symptomatic side. No evidence of thrombus. Normal compressibility. Common Femoral Vein: No evidence of thrombus. Normal compressibility, respiratory phasicity and response to augmentation. Saphenofemoral Junction: No evidence of thrombus. Normal compressibility and flow on color Doppler imaging. Profunda Femoral Vein: No evidence of thrombus. Normal compressibility and flow on color Doppler imaging. Femoral Vein: No evidence of thrombus. Normal compressibility, respiratory phasicity and response to augmentation. Popliteal Vein: No evidence of thrombus. Normal compressibility, respiratory phasicity and response to augmentation. Calf Veins: No evidence of thrombus. Normal compressibility and flow on color Doppler imaging. Superficial Great Saphenous Vein: No evidence of thrombus. Normal compressibility and flow on color Doppler imaging. Other Findings:  None. IMPRESSION: Sonographic survey of the left lower extremity negative for DVT Electronically Signed   By: Corrie Mckusick D.O.   On: 11/27/2018 07:45   US Venous Img Lower Unilateral Right  Result Date: 11/26/2018 CLINICAL DATA:  History of left common femoral DVT. Right calf pain. EXAM: RIGHT LOWER EXTREMITY VENOUS DOPPLER ULTRASOUND TECHNIQUE: Gray-scale sonography with compression, as well as color and duplex ultrasound, were performed to evaluate the deep venous system from the level of the common femoral vein through the popliteal and proximal calf veins. COMPARISON:  09/01/2018 FINDINGS: Normal compressibility of the common femoral, superficial femoral, and popliteal veins, as well as the proximal calf veins. No filling defects to suggest DVT on grayscale or color Doppler imaging. Doppler waveforms show normal direction of venous flow, normal respiratory  phasicity and response to augmentation. Survey views of the contralateral common femoral vein are unremarkable. Patchy femoral-popliteal arterial calcifications incidentally noted. IMPRESSION: No femoropopliteal and no calf DVT in the visualized calf veins on the RIGHT. If clinical symptoms are inconsistent or if there are persistent or worsening symptoms, further imaging (possibly involving the iliac veins) may be warranted. Electronically Signed   By: Lucrezia Europe M.D.   On: 11/26/2018 13:26   ASSESSMENT AND PLAN:   GI bleed- patient denies any additional bleeding episodes. -Change from IV to p.o. Protonix 40 mg twice daily today -GI following- will likely do an outpatient endoscopy -Holding Eliquis -Advance to regular diet today  Acute blood loss anemia with a history of iron deficiency anemia -Hemoglobin stable from yesterday to today -Ferritin was low- IV iron ordered for today -Recheck hemoglobin in the morning  2:1 AV block / LBBB- having sinus pauses on telemetry. Not on any AV-blocking medications. -Cardiology consulted -ECHO ordered  History of left lower extremity DVT- patient has had 2 DVTs in the past, one in 2015 and one in July of this year. -Repeat LLE duplex US was negative for DVT -Holding eliquis for now  Hypertension- normotensive over the last 24 hours -Continue holding home lisinopril  AKI in CKD III- likely prerenal in the setting of hypotension and GI bleed.  Creatinine has greatly improved. -Continue gentle IV fluids  Dementia- stable -Supportive care  Possible discharge home tomorrow. Daughter, Neoma Laming, was updated via the phone.  All the records are reviewed and case discussed with Care Management/Social Worker Management plans discussed with the patient, family and they are in agreement.  CODE  STATUS: FULL CODE  DVT Prophylaxis: SCDs  TOTAL TIME TAKING CARE OF THIS PATIENT: 35 minutes.   POSSIBLE D/C IN 1-2 DAYS, DEPENDING ON CLINICAL  CONDITION.  Jinny BlossomKaty D Lawyer Washabaugh M.D on 11/27/2018 at 10:50 AM  Between 7am to 6pm - Pager - [phone]  After 6pm go to [email or web address] - password [email or web address] [facility]  [facility]  Office  [phone]  CC: Primary care physician; Erasmo DownerBacigalupo, Angela M, MD  Note: This dictation was prepared with Dragon dictation along with smaller phrase technology. Any transcriptional errors that result from this process are unintentional.

## 2018-11-27 NOTE — Evaluation (Signed)
Physical Therapy Evaluation Patient Details Name: Kerri Ramirez MRN: [id number] DOB: Oct 25, 1925 Today's Date: 11/27/2018   History of Present Illness  Pt is a 85 y.o. female with a hx of DVT on Eliquis, CKD stage III, HTN, Alzheimer's dementia, anemia, and colon cancer in 1999, admitted for upper GI bleed, SOB, has recieved two transfusions this hospital stay. hemoglobin 7.9 today.    Clinical Impression  Patient alert, in bed, oriented to place and person, disoriented to situation. The patient reported that she lives with her husband, ambulates most often with her walker in the home, husband performs cooking, cleaning, driving, etc. She states she can get dressed/bathe modI but he can help her if she needs it.   The patient demonstrated bed mobility mod I. Sit <> stand from bed and from [facility] with CGA and RW. The patient often needed verbal cues to attend to tasks. The patient was able to utilize [facility] with supervision, and able to perform self care with verbal cues. The patient then ambulated ~176ft with RW and CGA/supervision, HR in the 100's, spO2>90% on room air. Pt instructed in activity pacing and standing rest breaks if needed. At end of ambulation pt with increased WOB noted, and stated she was a little tired.  Overall the patient demonstrated deficits (see "PT Problem List") that impede the patient's functional abilities, safety, and mobility and would benefit from skilled PT intervention. Recommendation is HHPT with 24/7 supervision for safety.     Follow Up Recommendations Home health PT;Supervision/Assistance - 24 hour    Equipment Recommendations  None recommended by PT;Other (comment)(Pt reported having RW at home)    Recommendations for Other Services       Precautions / Restrictions Precautions Precautions: None Restrictions Weight Bearing Restrictions: No      Mobility  Bed Mobility Overal bed mobility: Modified Independent             General bed mobility  comments: use of bed rails  Transfers Overall transfer level: Modified independent Equipment used: Rolling walker (2 wheeled)                Ambulation/Gait Ambulation/Gait assistance: Min guard;Supervision Gait Distance (Feet): 165 Feet Assistive device: Rolling walker (2 wheeled) Gait Pattern/deviations: WFL(Within Functional Limits)   Gait velocity interpretation: 1.31 - 2.62 ft/sec, indicative of limited community ambulator General Gait Details: Pt cued for activity pacing and standing rest breaks if needed, pt stated that she was doing well/feeling fine. SpO2 in the 90s on roomair while ambulating.  Stairs            Wheelchair Mobility    Modified Rankin (Stroke Patients Only)       Balance Overall balance assessment: Needs assistance Sitting-balance support: Feet supported Sitting balance-Leahy Scale: Good       Standing balance-Leahy Scale: Good                               Pertinent Vitals/Pain Pain Assessment: Faces Faces Pain Scale: No hurt    Home Living Family/patient expects to be discharged to:: Private residence Living Arrangements: Spouse/significant other Available Help at Discharge: Family Type of Home: House Home Access: Stairs to enter Entrance Stairs-Rails: Chemical engineer of Steps: 2 or 4 steps Home Layout: One level Home Equipment: Environmental consultant - 2 wheels;Cane - single point;Shower seat      Prior Function Level of Independence: Needs assistance   Gait / Transfers Assistance Needed: Pt reported  she uses her RW at home most often, does have a cane  ADL's / Homemaking Assistance Needed: husbands assists as needed for dressing bathing per pt. Usually able to bathe without physical assist        Hand Dominance   Dominant Hand: Right    Extremity/Trunk Assessment   Upper Extremity Assessment Upper Extremity Assessment: Generalized weakness    Lower Extremity Assessment Lower Extremity  Assessment: Generalized weakness    Cervical / Trunk Assessment Cervical / Trunk Assessment: Normal  Communication   Communication: HOH  Cognition Arousal/Alertness: Awake/alert Behavior During Therapy: WFL for tasks assessed/performed Overall Cognitive Status: No family/caregiver present to determine baseline cognitive functioning                                 General Comments: Pt oriented to self and place      General Comments      Exercises Other Exercises Other Exercises: pt able to utilize [facility] with supervision   Assessment/Plan    PT Assessment Patient needs continued PT services  PT Problem List Decreased strength;Decreased mobility;Decreased activity tolerance;Decreased balance       PT Treatment Interventions DME instruction;Therapeutic exercise;Gait training;Balance training;Stair training;Therapeutic activities;Patient/family education;Neuromuscular re-education    PT Goals (Current goals can be found in the Care Plan section)  Acute Rehab PT Goals Patient Stated Goal: to get stronger PT Goal Formulation: With patient Time For Goal Achievement: 12/11/18 Potential to Achieve Goals: Good    Frequency Min 2X/week   Barriers to discharge        Co-evaluation               AM-PAC PT "6 Clicks" Mobility  Outcome Measure Help needed turning from your back to your side while in a flat bed without using bedrails?: A Little Help needed moving from lying on your back to sitting on the side of a flat bed without using bedrails?: A Little Help needed moving to and from a bed to a chair (including a wheelchair)?: None Help needed standing up from a chair using your arms (e.g., wheelchair or bedside chair)?: None   Help needed climbing 3-5 steps with a railing? : A Little 6 Click Score: 17    End of Session Equipment Utilized During Treatment: Gait belt Activity Tolerance: Patient tolerated treatment well Patient left: in chair;with chair  alarm set;with nursing/sitter in room;with call bell/phone within reach Nurse Communication: Mobility status PT Visit Diagnosis: Other abnormalities of gait and mobility (R26.89);Difficulty in walking, not elsewhere classified (R26.2);Muscle weakness (generalized) (M62.81)    Time: 4818-5631 PT Time Calculation (min) (ACUTE ONLY): 29 min   Charges:   PT Evaluation $PT Eval Low Complexity: 1 Low PT Treatments $Therapeutic Exercise: 8-22 mins        Olga Coaster PT, DPT 2:28 PM,11/27/18 [phone]

## 2018-11-27 NOTE — TOC Initial Note (Signed)
Transition of Care [facility]) - Initial/Assessment Note    Patient Details  Name: Kerri Ramirez MRN: [id number] Date of Birth: [birth year]  Transition of Care [facility]) CM/SW Contact:    Candie Chroman, LCSW Phone Number: 11/27/2018, 3:32 PM  Clinical Narrative: Patient not fully oriented. CSW called patient's daughter, introduced role, and explained that discharge planning would be discussed. Patient's daughter confirmed she is active with [facility] for nursing (wound care). She is agreeable to adding HHPT. [facility] is able to accommodate that. Patient has a rolling walker and cane at home. She very rarely uses the walker and only uses the cane when feeling unsteady. More often than not, patient uses no assistive devices. Patient's husband and daughter assist as needed. Daughter lives in [place] but comes by to check on them Mondays and Fridays. Patient's daughter will pick her up tomorrow. No further concerns. CSW encouraged patient's daughter to contact CSW as needed. CSW will continue to follow patient and her daughter for support and facilitate return home tomorrow.                 Expected Discharge Plan: [place] Barriers to Discharge: Continued Medical Work up   Patient Goals and CMS Choice Patient states their goals for this hospitalization and ongoing recovery are:: Patient not fully oriented.   Choice offered to / list presented to : NA  Expected Discharge Plan and Services Expected Discharge Plan: [place] Choice: [place] arrangements for the past 2 months: Single Family Home                 DME Arranged: N/A         HH Arranged: RN, PT HH Agency: [facility] Date [place]: 11/27/18   Representative spoke with at [place]: [place] Arrangements/Services Living arrangements for the past 2 months: [place] Lives with:: Spouse Patient language and need for  interpreter reviewed:: Yes Do you feel safe going back to the place where you live?: Yes      Need for Family Participation in Patient Care: Yes (Comment) Care giver support system in place?: Yes (comment) Current home services: DME, Home RN Criminal Activity/Legal Involvement Pertinent to Current Situation/Hospitalization: No - Comment as needed  Activities of Daily Living Home Assistive Devices/Equipment: Environmental consultant (specify type), Cane (specify quad or straight) ADL Screening (condition at time of admission) Patient's cognitive ability adequate to safely complete daily activities?: Yes Is the patient deaf or have difficulty hearing?: No Does the patient have difficulty seeing, even when wearing glasses/contacts?: No Does the patient have difficulty concentrating, remembering, or making decisions?: No Patient able to express need for assistance with ADLs?: Yes Does the patient have difficulty dressing or bathing?: No Independently performs ADLs?: Yes (appropriate for developmental age) Does the patient have difficulty walking or climbing stairs?: No Weakness of Legs: None Weakness of Arms/Hands: None  Permission Sought/Granted Permission sought to share information with : Facility Sport and exercise psychologist, Family Supports    Share Information with NAME: Earnest Bailey  Permission granted to share info w AGENCY: [facility]  Permission granted to share info w Relationship: Daughter  Permission granted to share info w Contact Information: [phone]  Emotional Assessment Appearance:: Appears stated age Attitude/Demeanor/Rapport: Unable to Assess Affect (typically observed): Unable to Assess Orientation: : Oriented to Self, Oriented to Place, Oriented to  Time Alcohol / Substance Use: Never Used Psych  Involvement: No (comment)  Admission diagnosis:  AKI (acute kidney injury) (HCC) [N17.9] Gastrointestinal hemorrhage, unspecified gastrointestinal hemorrhage type [K92.2] Anemia,  unspecified type [D64.9] Patient Active Problem List   Diagnosis Date Noted  . Upper GI bleed 11/25/2018  . Senile purpura (HCC) 11/01/2017  . Hypernatremia 11/01/2017  . Fracture of metacarpal bone 09/07/2016  . Ventral hernia without obstruction or gangrene 05/09/2015  . Dementia (HCC) 04/28/2015  . DVT, lower extremity, recurrent, left (HCC) 10/29/2014  . Acid reflux 10/29/2014  . Prediabetes 10/29/2014  . Gout 10/29/2014  . 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 HLD (hyperlipidemia) 10/29/2014  . Essential hypertension 10/29/2014  . History of iron deficiency anemia 10/29/2014  . Colitis, ischemic (HCC) 10/29/2014  . LBP (low back pain) 10/29/2014  . Adiposity 10/29/2014  . OP (osteoporosis) 10/29/2014  . Diarrhea 10/29/2014  . Telogen effluvium 10/29/2014  . Tubular adenoma of colon 11/22/1997   PCP:  Erasmo Downer, MD Pharmacy:   [facility] [phone] [place], [place] - [address] [place] [address] Phone: [phone] Fax: [phone]  [facility] [address] [place] - [address] [place] [address] Phone: [phone] Fax: [phone]     Social Determinants of Health (SDOH) Interventions    Readmission Risk Interventions No flowsheet data found.

## 2018-11-28 LAB — HEMOGLOBIN AND HEMATOCRIT, BLOOD
HCT: 24.1 % — ABNORMAL LOW (ref 36.0–46.0)
Hemoglobin: 7.7 g/dL — ABNORMAL LOW (ref 12.0–15.0)

## 2018-11-28 MED ORDER — PANTOPRAZOLE SODIUM 40 MG PO TBEC: 40.0000 mg | DELAYED_RELEASE_TABLET | Freq: Two times a day (BID) | ORAL | 0 refills | Status: DC

## 2018-11-28 MED ORDER — FERROUS SULFATE 325 (65 FE) MG PO TABS: 325.0000 mg | ORAL_TABLET | Freq: Every day | ORAL | 0 refills | Status: DC

## 2018-11-28 NOTE — TOC Transition Note (Signed)
Transition of Care [facility]) - CM/SW Discharge Note   Patient Details  Name: Kerri Ramirez MRN: [id number] Date of Birth: [birth year]  [facility]) CM/SW Contact:  Candie Chroman, LCSW Phone Number: 11/28/2018, 10:39 AM   Clinical Narrative: Patient has orders to discharge home today. CSW left message for [facility] representative to let her know that OT and aide were added to the home health order. No further concerns. CSW signing off.    Final next level of care: [place] Barriers to Discharge: Barriers Resolved   Patient Goals and CMS Choice Patient states their goals for this hospitalization and ongoing recovery are:: Patient not fully oriented.   Choice offered to / list presented to : NA  Discharge Placement                Patient to be transferred to facility by: Daughter will pick her up. Name of family member notified: Earnest Bailey Patient and family notified of of transfer: 11/28/18  Discharge Plan and Services     Post Acute Care Choice: Home Health          DME Arranged: N/A         HH Arranged: RN, PT, OT, Nurse's Aide [place] Agency: [facility] Date [place]: 11/28/18   Representative spoke with at [place]: [place] ([place]) Interventions     Readmission Risk Interventions No flowsheet data found.

## 2018-11-28 NOTE — Progress Notes (Signed)
Physical Therapy Treatment Patient Details Name: Kerri Ramirez MRN: [id number] DOB: [birth year] Today's Date: 11/28/2018    History of Present Illness Pt is a 85 y.o. female with a hx of DVT on Eliquis, CKD stage III, HTN, Alzheimer's dementia, anemia, and colon cancer in 1999, admitted for upper GI bleed, SOB, has recieved two transfusions this hospital stay. hemoglobin 7.7 today.    PT Comments    The patient was asleep, easily woken at start of session and agreeable to PT/OOB in prep for breakfast. Overall the patient demonstrated increased fatigue and SOB this session compared to previous session, but also much more tired/sleepy today as well. The patient demonstrated bed mobility mod I (handheld assist provided but not required), and sat EOB for a few minutes due to dizziness that decreased with time. Transfers and ambulation attempted without RW this session. Pt with unsteadiness, weaving gait path, and reached for external support. Significantly improved safety with RW. Able to ambulate >115ft with RW and CGA. 1 standing rest break needed, pt more SOB this session and decreased gait velocity noted. The patient was up in chair, all needs in reach, HR and SpO2 WFLs, BP 130/42. The patient would benefit from further skilled PT intervention to maximize safety, mobility, and independence.    Follow Up Recommendations  Home health PT;Supervision/Assistance - 24 hour     Equipment Recommendations  None recommended by PT;Other (comment)(Pt has RW at home)    Recommendations for Other Services       Precautions / Restrictions Precautions Precautions: Fall Restrictions Weight Bearing Restrictions: No    Mobility  Bed Mobility Overal bed mobility: Modified Independent             General bed mobility comments: handheld assist provided but not required  Transfers Overall transfer level: Needs assistance Equipment used: Rolling walker (2 wheeled);1 person hand held assist              General transfer comment: Attempted transfer without AD, pt needed handheld assist for initial standing balance deficits  Ambulation/Gait Ambulation/Gait assistance: Supervision;Min guard Gait Distance (Feet): 165 Feet Assistive device: Rolling walker (2 wheeled)       General Gait Details: Gait assessed without RW, pt with unsteadiness, weaving gait path, and reaching for external support. Significantly improved safety with RW. Able to ambulate >157ft with RW and CGA. Pt more SOB this session and decreased gait velocity noted.   Stairs             Wheelchair Mobility    Modified Rankin (Stroke Patients Only)       Balance Overall balance assessment: Needs assistance Sitting-balance support: Feet supported Sitting balance-Leahy Scale: Good       Standing balance-Leahy Scale: Fair                              Cognition Arousal/Alertness: Lethargic Behavior During Therapy: WFL for tasks assessed/performed Overall Cognitive Status: No family/caregiver present to determine baseline cognitive functioning                                 General Comments: Pt oriented to self and place, much more sleepy this session      Exercises Other Exercises Other Exercises: BP,HR and O2 assessed in sitting after ambulation due to patients SOB, and request to lay back. all WFLs (130/42 for BP, RN notified)  General Comments        Pertinent Vitals/Pain Pain Assessment: Faces Faces Pain Scale: No hurt    Home Living                      Prior Function            PT Goals (current goals can now be found in the care plan section) Progress towards PT goals: Progressing toward goals    Frequency    Min 2X/week      PT Plan Current plan remains appropriate    Co-evaluation              AM-PAC PT "6 Clicks" Mobility   Outcome Measure  Help needed turning from your back to your side while in a flat bed  without using bedrails?: A Little Help needed moving from lying on your back to sitting on the side of a flat bed without using bedrails?: A Little Help needed moving to and from a bed to a chair (including a wheelchair)?: A Little Help needed standing up from a chair using your arms (e.g., wheelchair or bedside chair)?: A Little Help needed to walk in hospital room?: A Little Help needed climbing 3-5 steps with a railing? : A Little 6 Click Score: 18    End of Session Equipment Utilized During Treatment: Gait belt Activity Tolerance: Patient limited by fatigue Patient left: in chair;with chair alarm set;with nursing/sitter in room;with call bell/phone within reach Nurse Communication: Mobility status PT Visit Diagnosis: Other abnormalities of gait and mobility (R26.89);Difficulty in walking, not elsewhere classified (R26.2);Muscle weakness (generalized) (M62.81)     Time: 8250-5397 PT Time Calculation (min) (ACUTE ONLY): 20 min  Charges:  $Therapeutic Exercise: 8-22 mins                     Lieutenant Diego PT, DPT 9:35 AM,11/28/18 [phone]

## 2018-11-28 NOTE — Discharge Instructions (Signed)
It was so nice to meet you during this hospitalization!  You came into the hospital with shortness of breath. We think this was due to your hemoglobin being too low. You probably had some bleeding in your stomach or intestines due to being on your blood thinner (Eliquis). We repeated the ultrasound of your legs and your blood clot in your left leg has gone away. We are going to STOP your eliquis for now, until you are seen your primary care doctor and the GI doctor in clinic. I have also stopped your lisinopril for now because your blood pressure was low in the hospital. Your primary care doctor may choose to restart this.  I have prescribed protonix 40mg  twice a day- this will help decrease the acid in your stomach to help your stomach heal. I have also prescribed an iron pill (ferrrous sulfate) for you to take once daily.  Take care, Dr. Brett Albino

## 2018-11-28 NOTE — Care Management Important Message (Signed)
Important Message  Patient Details  Name: Kerri Ramirez MRN: [id number] Date of Birth: [birth year]   Medicare Important Message Given:  Yes  Reviewed over phone with Earnest Bailey, daughter, at [phone].  Aware of right and in agreement with discharge plans.  Advised copy left in room with patient.     Dannette Barbara 11/28/2018, 10:58 AM

## 2018-11-28 NOTE — Plan of Care (Signed)
Pt is d/ced home where she lives with husband.  Has support of son and daughter.  Will be getting home health.  Pt's Hgb is stable at 7.7 - dr is starting her on some iron.  Daughter is picking her up.  IV's removed - will review d/c instructions with daughter.

## 2018-11-28 NOTE — Discharge Summary (Addendum)
[place] at [place] NAME: Kathlynn Swofford    MR#:  [id number]  DATE OF BIRTH:  [birth year]  DATE OF ADMISSION:  11/25/2018   ADMITTING PHYSICIAN: Hillary Bow, MD  DATE OF DISCHARGE: 11/28/18  PRIMARY CARE PHYSICIAN: [name], MD   ADMISSION DIAGNOSIS:  AKI (acute kidney injury) ([place]) [N17.9] Gastrointestinal hemorrhage, unspecified gastrointestinal hemorrhage type [K92.2] Anemia, unspecified type [D64.9] DISCHARGE DIAGNOSIS:  Active Problems:   Upper GI bleed  SECONDARY DIAGNOSIS:   Past Medical History:  Diagnosis Date   Dementia ([place])    DVT (deep vein thrombosis) in pregnancy    Hypertension    HOSPITAL COURSE:   Makaelyn is a 85 year old female who presented to the ED with shortness of breath.  In the ED, hemoglobin was 6.6 and she was FOBT positive.  She was also found to have an AKI.  She was admitted for further management.  GI bleed resolved. No active bleeding noted this admission. -Initially on a Protonix drip, the patient was discharged on Protonix 40 mg p.o. twice daily -Eliquis was held on discharge -Patient did not want to have an endoscopy or colonoscopy done as an inpatient -Patient should follow-up with GI in 2 weeks -PT recommended home health PT.  Home health orders were placed prior to discharge.   Acute blood loss anemia with a history of iron deficiency anemia -Hemoglobin was stable s/p 2 units PRBC -Ferritin was low and she was given a dose of IV iron -Discharged on ferrous sulfate 325mg  daily -Needs CBC rechecked as an outpatient  History of left lower extremity DVT- patient has had 2 DVTs in the past, one in 2015 and one in July of this year. -Repeat LLE duplex US was negative for DVT -Eliquis was discontinued -If anticoagulant needs to be restarted in the future, may need to have patient see GI for consideration of endoscopy/colonoscopy prior to restarting med   2:1 AV block /  LBBB- having sinus pauses on telemetry. -Cardiology consulted and recommended avoiding AV nodal blocking agents   Hypertension BP was low-normal this admission -Lisinopril was held on discharge and can be restarted as needed as an outpatient   CKD IIIb initially with AKI, but this resolved with IV fluids -Holding lisinopril on discharge -Needs BMP rechecked as an outpatient   Dementia stable  DISCHARGE CONDITIONS:  GI bleed Acute blood loss anemia Iron deficiency anemia History of left lower extremity DVT 2:1 AV block/LBBB Hypertension CKD 3 Dementia CONSULTS OBTAINED:  Treatment Team:  Lin Landsman, MD DRUG ALLERGIES:   Allergies  Allergen Reactions   Codeine Other (See Comments)    Made head spin    DISCHARGE MEDICATIONS:   Allergies as of 11/28/2018       Reactions   Codeine Other (See Comments)   Made head spin         Medication List     STOP taking these medications    apixaban 5 MG Tabs tablet Commonly known as: Eliquis   lisinopril 20 MG tablet Commonly known as: ZESTRIL       TAKE these medications    Fish Oil 1000 MG Caps Take 1,000 mg by mouth daily.   memantine 5 MG tablet Commonly known as: NAMENDA Take 1 tablet by mouth twice daily   pantoprazole 40 MG tablet Commonly known as: PROTONIX Take 1 tablet (40 mg total) by mouth 2 (two) times daily.   vitamin B-12 1000 MCG tablet Commonly known  as: CYANOCOBALAMIN Take 1,000 mcg by mouth daily.         DISCHARGE INSTRUCTIONS:  1.  Follow-up with PCP in 5 days 2.  Follow-up with GI in 2 weeks 3.  Hold Eliquis and lisinopril on discharge 4.  Take Protonix 40 mg twice daily 5.  Take ferrous sulfate 325 mg daily 6.  Needs CBC and BMP rechecked as an outpatient DIET:  Cardiac diet DISCHARGE CONDITION:  Stable ACTIVITY:  Activity as tolerated OXYGEN:  Home Oxygen: No.  Oxygen Delivery: room air DISCHARGE LOCATION:  home   If you experience worsening of your  admission symptoms, develop shortness of breath, life threatening emergency, suicidal or homicidal thoughts you must seek medical attention immediately by calling 911 or calling your MD immediately  if symptoms less severe.  You Must read complete instructions/literature along with all the possible adverse reactions/side effects for all the Medicines you take and that have been prescribed to you. Take any new Medicines after you have completely understood and accpet all the possible adverse reactions/side effects.   Please note  You were cared for by a hospitalist during your hospital stay. If you have any questions about your discharge medications or the care you received while you were in the hospital after you are discharged, you can call the unit and asked to speak with the hospitalist on call if the hospitalist that took care of you is not available. Once you are discharged, your primary care physician will handle any further medical issues. Please note that NO REFILLS for any discharge medications will be authorized once you are discharged, as it is imperative that you return to your primary care physician (or establish a relationship with a primary care physician if you do not have one) for your aftercare needs so that they can reassess your need for medications and monitor your lab values.    On the day of Discharge:  VITAL SIGNS:  Blood pressure (!) 113/45, pulse 74, temperature 98.6 F (37 C), temperature source Oral, resp. rate 17, height 5\' 5"  (1.651 m), weight 64.2 kg, SpO2 100 %. PHYSICAL EXAMINATION:  GENERAL:  85 y.o.-year-old patient sitting up in chair, in no acute distress. EYES: Pupils equal, round, reactive to light and accommodation. No scleral icterus. Extraocular muscles intact.  HEENT: Head atraumatic, normocephalic. Oropharynx and nasopharynx clear.  NECK:  Supple, no jugular venous distention. No thyroid enlargement, no tenderness.  LUNGS: Normal breath sounds  bilaterally, no wheezing, rales,rhonchi or crepitation. No use of accessory muscles of respiration.  CARDIOVASCULAR: RRR, S1, S2 normal. No murmurs, rubs, or gallops.  ABDOMEN: Soft, non-tender, non-distended. Bowel sounds present. No organomegaly or mass.  EXTREMITIES: No pedal edema, cyanosis, or clubbing.  NEUROLOGIC: Cranial nerves II through XII are intact. + Global weakness. Sensation intact. Gait not checked.  PSYCHIATRIC: The patient is alert and oriented x 3.  SKIN: No obvious rash, lesion, or ulcer.  DATA REVIEW:   CBC Recent Labs  Lab 11/27/18 0434 11/28/18 0615  WBC 6.3  --   HGB 7.9* 7.7*  HCT 24.6* 24.1*  PLT 276  --     Chemistries  Recent Labs  Lab 11/25/18 0757  11/27/18 0434  NA 137   < > 139  K 5.5*   < > 4.2  CL 117*   < > 118*  CO2 8*   < > 20*  GLUCOSE 135*   < > 102*  BUN 75*   < > 42*  CREATININE 2.91*   < >  1.48*  CALCIUM 9.9   < > 9.1  MG  --   --  2.1  AST 71*  --   --   ALT 19  --   --   ALKPHOS 59  --   --   BILITOT 0.5  --   --    < > = values in this interval not displayed.     Microbiology Results  Results for orders placed or performed during the hospital encounter of 11/25/18  SARS CORONAVIRUS 2 (TAT 6-24 HRS) Nasopharyngeal Nasopharyngeal Swab     Status: None   Collection Time: 11/25/18  8:58 AM   Specimen: Nasopharyngeal Swab  Result Value Ref Range Status   SARS Coronavirus 2 NEGATIVE NEGATIVE Final    Comment: (NOTE) SARS-CoV-2 target nucleic acids are NOT DETECTED. The SARS-CoV-2 RNA is generally detectable in upper and lower respiratory specimens during the acute phase of infection. Negative results do not preclude SARS-CoV-2 infection, do not rule out co-infections with other pathogens, and should not be used as the sole basis for treatment or other patient management decisions. Negative results must be combined with clinical observations, patient history, and epidemiological information. The expected result is  Negative. Fact Sheet for Patients: HairSlick.nohttps://www.fda.gov/media/138098/download Fact Sheet for Healthcare Providers: quierodirigir.comhttps://www.fda.gov/media/138095/download This test is not yet approved or cleared by the [place] FDA and  has been authorized for detection and/or diagnosis of SARS-CoV-2 by FDA under an Emergency Use Authorization (EUA). This EUA will remain  in effect (meaning this test can be used) for the duration of the COVID-19 declaration under Section 56 4(b)(1) of the Act, 21 U.S.C. section 360bbb-3(b)(1), unless the authorization is terminated or revoked sooner. Performed at [facility], [address] [place], [place] [address]     RADIOLOGY:  No results found.   Management plans discussed with the patient, family and they are in agreement.  CODE STATUS: Full Code   TOTAL TIME TAKING CARE OF THIS PATIENT: 40 minutes.    Jinny BlossomKaty D Mayo M.D on 11/28/2018 at 10:05 AM  Between 7am to 6pm - Pager - [phone]  After 6pm go to [email or web address] - Social research officer, governmentpassword [email or web address] [email or web address]  [facility] [place] [facility]  Office  [phone]  CC: Primary care physician; Erasmo DownerBacigalupo, Angela M, MD   Note: This dictation was prepared with Dragon dictation along with smaller phrase technology. Any transcriptional errors that result from this process are unintentional.

## 2018-11-29 ENCOUNTER — Telehealth: Payer: Self-pay

## 2018-11-29 DIAGNOSIS — S51012D Laceration without foreign body of left elbow, subsequent encounter: Secondary | ICD-10-CM | POA: Diagnosis not present

## 2018-11-29 DIAGNOSIS — I10 Essential (primary) hypertension: Secondary | ICD-10-CM | POA: Diagnosis not present

## 2018-11-29 DIAGNOSIS — L03112 Cellulitis of left axilla: Secondary | ICD-10-CM | POA: Diagnosis not present

## 2018-11-29 DIAGNOSIS — F0390 Unspecified dementia without behavioral disturbance: Secondary | ICD-10-CM | POA: Diagnosis not present

## 2018-11-29 DIAGNOSIS — I82402 Acute embolism and thrombosis of unspecified deep veins of left lower extremity: Secondary | ICD-10-CM | POA: Diagnosis not present

## 2018-11-29 DIAGNOSIS — Z4800 Encounter for change or removal of nonsurgical wound dressing: Secondary | ICD-10-CM | POA: Diagnosis not present

## 2018-11-29 NOTE — Telephone Encounter (Signed)
HFU scheduled 12/04/18 @ 1:20 PM.

## 2018-11-29 NOTE — Telephone Encounter (Signed)
Transition Care Management Follow-up Telephone Call  Date of discharge and from where: [facility] on 11/28/18.  How have you been since you were released from the hospital? Doing well, just resting. Declines SOB, fever, pain, bleeding or n/v/d.  Any questions or concerns? None that she can think of at the time.   Items Reviewed:  Did the pt receive and understand the discharge instructions provided? Pt has not reviewed her d/c summary. Pt states daughter takes care of the paperwork and changes.  Medications obtained and verified? Pt declined stating she does not know her medications. Pt states these can be reviewed with daughter at [facility] apt.   Any new allergies since your discharge? No   Dietary orders reviewed? Yes  Do you have support at home? Yes   Other (ie: DME, Home Health, etc) N/A  Functional Questionnaire: (I = Independent and D = Dependent)  Bathing/Dressing- I   Meal Prep- I  Eating- I  Maintaining continence- I  Transferring/Ambulation- D, uses a cane and walker to get around.   Managing Meds- D, daughter and husband manage medications.    Follow up appointments reviewed:    PCP Hospital f/u appt confirmed? Yes  Scheduled to see Dr Brita Romp on 12/04/18 @ 1:20 PM.  [place] [facility] f/u appt confirmed? Yes    Are transportation arrangements needed? No   If their condition worsens, is the pt aware to call  their PCP or go to the ED? Yes  Was the patient provided with contact information for the PCP's office or ED? Yes  Was the pt encouraged to call back with questions or concerns? Yes

## 2018-12-04 ENCOUNTER — Encounter: Payer: Self-pay | Admitting: Family Medicine

## 2018-12-04 ENCOUNTER — Ambulatory Visit (INDEPENDENT_AMBULATORY_CARE_PROVIDER_SITE_OTHER): Payer: Medicare Other | Admitting: Family Medicine

## 2018-12-04 ENCOUNTER — Other Ambulatory Visit: Payer: Self-pay

## 2018-12-04 VITALS — BP 135/54 | HR 77 | Temp 96.9°F | Wt 144.0 lb

## 2018-12-04 DIAGNOSIS — K922 Gastrointestinal hemorrhage, unspecified: Secondary | ICD-10-CM | POA: Diagnosis not present

## 2018-12-04 DIAGNOSIS — Z23 Encounter for immunization: Secondary | ICD-10-CM

## 2018-12-04 DIAGNOSIS — N179 Acute kidney failure, unspecified: Secondary | ICD-10-CM | POA: Insufficient documentation

## 2018-12-04 DIAGNOSIS — I82402 Acute embolism and thrombosis of unspecified deep veins of left lower extremity: Secondary | ICD-10-CM | POA: Diagnosis not present

## 2018-12-04 DIAGNOSIS — I10 Essential (primary) hypertension: Secondary | ICD-10-CM | POA: Diagnosis not present

## 2018-12-04 DIAGNOSIS — D500 Iron deficiency anemia secondary to blood loss (chronic): Secondary | ICD-10-CM

## 2018-12-04 NOTE — Assessment & Plan Note (Signed)
Likely prerenal due to GI bleed and hypovolemia Recheck BMP Continue to hold lisinopril

## 2018-12-04 NOTE — Assessment & Plan Note (Signed)
Likely exacerbated with Eliquis and with slow/chronic GI bleed S/p transfusion in hospital and IV iron Continue oral iron supplement Recheck CBC May need to consider heme referral in future for further IV iron

## 2018-12-04 NOTE — Assessment & Plan Note (Signed)
Stable and well controlled Continue to hold lisinopril Avoid beta blockers

## 2018-12-04 NOTE — Assessment & Plan Note (Signed)
Diagnosed in 08/2018 Found to have resolved on [place] during hospitalization Eliquis stopped due to GI bleed and anemia Will not resume at this time Patient has history of recurrent DVT, so will need to monitor closely for any signs of recurrence

## 2018-12-04 NOTE — Progress Notes (Signed)
Patient: Kerri Ramirez Female    DOB: [birth year]   85 y.o.   MRN: [id number] Visit Date: 12/04/2018  Today's Provider: Shirlee Latch, MD   Chief Complaint  Patient presents with  . Hospitalization Follow-up   Subjective:     HPI     Follow up Hospitalization  Patient was admitted to [facility] on 11/25/2018 and discharged on 11/28/2018 for:     AKI (acute kidney injury) (HCC) [N17.9]  Gastrointestinal hemorrhage, unspecified gastrointestinal hemorrhage type [K92.2] Anemia, unspecified type [D64.9]   Treatment for this included Starting Ferrous Sulfate 325mg  and Pantoprazole 40mg  BID.  Held Lisinopril for AKI  Also noted to have pauses on tele - do not use any CCB or Beta blocker per cardiology  Telephone follow up was done on 11/29/2018 She reports excellent compliance with treatment. She reports this condition is Improved.  Pt is still complaining of fatigue, but feels more like herself than she did.  Has f/u with GI scheduled in a few weeks. HH PT/RN have not come out to the house to assist yet. Daughter states that they have not called her. ------------------------------------------------------------------------------------     Allergies  Allergen Reactions  . Codeine Other (See Comments)    Made head spin      Current Outpatient Medications:  .  ferrous sulfate 325 (65 FE) MG tablet, Take 1 tablet (325 mg total) by mouth daily., Disp: 30 tablet, Rfl: 0 .  memantine (NAMENDA) 5 MG tablet, Take 1 tablet by mouth twice daily (Patient taking differently: Take 5 mg by mouth 2 (two) times daily. ), Disp: 180 tablet, Rfl: 0 .  Omega-3 Fatty Acids (FISH OIL) 1000 MG CAPS, Take 1,000 mg by mouth daily. , Disp: , Rfl:  .  pantoprazole (PROTONIX) 40 MG tablet, Take 1 tablet (40 mg total) by mouth 2 (two) times daily., Disp: 60 tablet, Rfl: 0 .  vitamin B-12 (CYANOCOBALAMIN) 1000 MCG tablet, Take 1,000 mcg by mouth daily. , Disp: , Rfl:   Review of Systems   Constitutional: Positive for fatigue. Negative for activity change, appetite change, chills, diaphoresis, fever and unexpected weight change.  Respiratory: Negative.   Cardiovascular: Negative.   Gastrointestinal: Negative.   Neurological: Negative for dizziness, light-headedness and headaches.    Social History   Tobacco Use  . Smoking status: Former Smoker    Years: 2.00    Types: Cigarettes    Quit date: 02/16/1944    Years since quitting: 74.8  . Smokeless tobacco: Never Used  Substance Use Topics  . Alcohol use: No      Objective:   BP (!) 135/54 (BP Location: Left Arm, Patient Position: Sitting, Cuff Size: Large)   Pulse 77   Temp (!) 96.9 F (36.1 C) (Temporal)   Wt 144 lb (65.3 kg)   BMI 23.96 kg/m  Vitals:   12/04/18 1327  BP: (!) 135/54  Pulse: 77  Temp: (!) 96.9 F (36.1 C)  TempSrc: Temporal  Weight: 144 lb (65.3 kg)  Body mass index is 23.96 kg/m.   Physical Exam Vitals signs reviewed.  Constitutional:      General: She is not in acute distress.    Appearance: Normal appearance. She is not diaphoretic.  HENT:     Head: Normocephalic and atraumatic.  Neck:     Musculoskeletal: Neck supple.  Cardiovascular:     Rate and Rhythm: Normal rate and regular rhythm.     Heart sounds: Murmur present.  Pulmonary:  Effort: Pulmonary effort is normal. No respiratory distress.     Breath sounds: Normal breath sounds. No wheezing, rhonchi or rales.  Abdominal:     General: There is no distension.     Palpations: Abdomen is soft.     Tenderness: There is no abdominal tenderness. There is no guarding or rebound.  Musculoskeletal:     Right lower leg: Edema (trace) present.     Left lower leg: Edema (1+) present.  Lymphadenopathy:     Cervical: No cervical adenopathy.  Skin:    General: Skin is warm and dry.     Findings: No rash.     Comments: Wound on L forearm is healing well. +granulation tissue present  Neurological:     Mental Status: She is  alert and oriented to person, place, and time. Mental status is at baseline.     Motor: No weakness.  Psychiatric:        Mood and Affect: Mood normal.        Behavior: Behavior normal.      No results found for any visits on 12/04/18.     Assessment & Plan   Problem List Items Addressed This Visit      Cardiovascular and Mediastinum   DVT, lower extremity, recurrent, left ([place])    Diagnosed in 08/2018 Found to have resolved on [place] during hospitalization Eliquis stopped due to GI bleed and anemia Will not resume at this time Patient has history of recurrent DVT, so will need to monitor closely for any signs of recurrence      Essential hypertension    Stable and well controlled Continue to hold lisinopril Avoid beta blockers        Digestive   Upper GI bleed - Primary    Likely a slow chronic bleed with downtrending Hgb S/p 2u pRBCs during hospitalization Continue to hold Eliquis Upcoming appt with GI - not scoped in hospital Discussed return precautions      Relevant Orders   CBC   Ambulatory referral to [place]   AKI (acute kidney injury) ([place])    Likely prerenal due to GI bleed and hypovolemia Recheck BMP Continue to hold lisinopril      Relevant Orders   Basic Metabolic Panel (BMET)   Ambulatory referral to [place]     Other   Iron deficiency anemia    Likely exacerbated with Eliquis and with slow/chronic GI bleed S/p transfusion in hospital and IV iron Continue oral iron supplement Recheck CBC May need to consider heme referral in future for further IV iron      Relevant Orders   CBC   Ambulatory referral to [place]       Return in about 4 weeks (around 01/01/2019) for GI bleed, anemia f/u.   The entirety of the information documented in the History of Present Illness, Review of Systems and Physical Exam were personally obtained by me. Portions of this information were initially documented by Ashley Royalty, CMA  and reviewed by me for thoroughness and accuracy.    Bac[place]alupo, Dionne Bucy, MD MPH [facility]

## 2018-12-04 NOTE — Addendum Note (Signed)
Addended by: Ashley Royalty E on: 12/04/2018 02:14 PM   Modules accepted: Orders

## 2018-12-04 NOTE — Patient Instructions (Signed)
Gastrointestinal Bleeding °Gastrointestinal (GI) bleeding is bleeding somewhere along the digestive tract, between the mouth and the anus. This tract includes the mouth, esophagus, stomach, small intestine, large intestine, and anus. The large intestine is often called the colon. °GI bleeding can be caused by various problems. The severity of these problems can range from mild to serious or even life-threatening. If you have GI bleeding, you may find blood in your stools (feces), you may have black stools, or you may vomit blood. If there is a lot of bleeding, you may need to stay in the hospital. °What are the causes? °This condition may be caused by: °· Inflammation, irritation, or swelling of the esophagus (esophagitis). The esophagus is part of the body that moves food from your mouth to your stomach. °· Swollen veins in the rectum (hemorrhoids). °· Areas of painful tearing in the anus that are often caused by passing hard stool (anal fissures). °· Pouches that form on the colon over time, with age, and may bleed a lot (diverticulosis). °· Inflammation (diverticulitis) in areas with diverticulosis. This can cause pain, fever, and bloody stools, although bleeding may be mild. °· Growths (polyps) or cancer. Colon cancer often starts out as precancerous polyps. °· Gastritis and ulcers. With these, bleeding may come from the upper GI tract, near the stomach. °What increases the risk? °You are more likely to develop this condition if you: °· Have an infection in your stomach from a type of bacteria called Helicobacter pylori. °· Take certain medicines, such as: °? NSAIDs. °? Aspirin. °? Selective serotonin reuptake inhibitors (SSRIs). °? Steroids. °? Antiplatelet or anticoagulant medicines. °· Smoke. °· Drink alcohol. °What are the signs or symptoms? °Common symptoms of this condition include: °· Bright red blood in your vomit, or vomit that looks like coffee grounds. °· Bloody, black, or tarry stools. °? Bleeding  from the lower GI tract will usually cause red or maroon blood in the stools. °? Bleeding from the upper GI tract may cause black, tarry stools that are often stronger smelling than usual. °? In certain cases, if the bleeding is fast enough, the stools may be red. °· Pain or cramping in the abdomen. °How is this diagnosed? °This condition may be diagnosed based on: °· Your medical history and a physical exam. °· Various tests, such as: °? Blood tests. °? Stool tests. °? X-rays and other imaging tests. °? Esophagogastroduodenoscopy (EGD). In this test, a flexible, lighted tube is used to look at your esophagus, stomach, and small intestine. °? Colonoscopy. In this test, a flexible, lighted tube is used to look at your colon. °How is this treated? °Treatment for this condition depends on the cause of the bleeding. For example: °· For bleeding from the esophagus, stomach, small intestine, or colon, the health care provider doing your EGD or colonoscopy may be able to stop the bleeding as part of the procedure. °· Inflammation or infection of the colon can be treated with medicines. °· Certain rectal problems can be treated with creams, suppositories, or warm baths. °· Medicines may be given to reduce acid in your stomach. °· Surgery is sometimes needed. °· Blood transfusions are sometimes needed if a lot of blood has been lost. °If bleeding is mild, you may be allowed to go home. If there is a lot of bleeding, you will need to stay in the hospital for observation. °Follow these instructions at home: ° °· Take over-the-counter and prescription medicines only as told by your health care provider. °·   Eat foods that are high in fiber, such as beans, whole grains, and fresh fruits and vegetables. This will help to keep your stools soft. Eating 1-3 prunes each day works well for many people. °· Drink enough fluid to keep your urine pale yellow. °· Keep all follow-up visits as told by your health care provider. This is  important. °Contact a health care provider if: °· Your symptoms do not improve. °Get help right away if: °· Your bleeding does not stop. °· You feel light-headed or you faint. °· You feel weak. °· You have severe cramps in your back or abdomen. °· You pass large blood clots in your stool. °· Your symptoms are getting worse. °· You have chest pain or fast heartbeats. °Summary °· Gastrointestinal (GI) bleeding is bleeding somewhere along the digestive tract, between the mouth and anus. GI bleeding can be caused by various problems. The severity of these problems can range from mild to serious or even life-threatening. °· Treatment for this condition depends on the cause of the bleeding. °· Take over-the-counter and prescription medicines only as told by your health care provider. °· Keep all follow-up visits as told by your health care provider. This is important. °· Get help right away if your bleeding increases, your symptoms are getting worse, or you have new symptoms. °This information is not intended to replace advice given to you by your health care provider. Make sure you discuss any questions you have with your health care provider. °Document Released: 01/30/2000 Document Revised: 09/14/2017 Document Reviewed: 09/14/2017 °Elsevier Patient Education © 2020 [employer] ° °

## 2018-12-04 NOTE — Assessment & Plan Note (Signed)
Likely a slow chronic bleed with downtrending Hgb S/p 2u pRBCs during hospitalization Continue to hold Eliquis Upcoming appt with GI - not scoped in hospital Discussed return precautions

## 2018-12-05 ENCOUNTER — Telehealth: Payer: Self-pay

## 2018-12-05 LAB — CBC
Hematocrit: 26.6 % — ABNORMAL LOW (ref 34.0–46.6)
Hemoglobin: 8.5 g/dL — ABNORMAL LOW (ref 11.1–15.9)
MCH: 27.8 pg (ref 26.6–33.0)
MCHC: 32 g/dL (ref 31.5–35.7)
MCV: 87 fL (ref 79–97)
Platelets: 295 10*3/uL (ref 150–450)
RBC: 3.06 x10E6/uL — ABNORMAL LOW (ref 3.77–5.28)
RDW: 15.1 % (ref 11.7–15.4)
WBC: 7.8 10*3/uL (ref 3.4–10.8)

## 2018-12-05 LAB — BASIC METABOLIC PANEL
BUN/Creatinine Ratio: 26 (ref 12–28)
BUN: 33 mg/dL (ref 10–36)
CO2: 17 mmol/L — ABNORMAL LOW (ref 20–29)
Calcium: 9.3 mg/dL (ref 8.7–10.3)
Chloride: 116 mmol/L (ref 96–106)
Creatinine, Ser: 1.25 mg/dL — ABNORMAL HIGH (ref 0.57–1.00)
GFR calc Af Amer: 43 mL/min/{1.73_m2} — ABNORMAL LOW (ref >59)
GFR calc non Af Amer: 38 mL/min/{1.73_m2} — ABNORMAL LOW (ref >59)
Glucose: 108 mg/dL — ABNORMAL HIGH (ref 65–99)
Potassium: 3.9 mmol/L (ref 3.5–5.2)
Sodium: 144 mmol/L (ref 134–144)

## 2018-12-05 NOTE — Telephone Encounter (Signed)
Neoma Laming (Pt's daughter) advised.   Thanks,   -Mickel Baas

## 2018-12-05 NOTE — Telephone Encounter (Signed)
-----   Message from [name], MD sent at 12/05/2018  8:00 AM EDT ----- Kidney function and hemoglobin are improving.  Will repeat at next visit as well.

## 2018-12-06 DIAGNOSIS — Z4800 Encounter for change or removal of nonsurgical wound dressing: Secondary | ICD-10-CM | POA: Diagnosis not present

## 2018-12-06 DIAGNOSIS — I10 Essential (primary) hypertension: Secondary | ICD-10-CM | POA: Diagnosis not present

## 2018-12-06 DIAGNOSIS — I82402 Acute embolism and thrombosis of unspecified deep veins of left lower extremity: Secondary | ICD-10-CM | POA: Diagnosis not present

## 2018-12-06 DIAGNOSIS — F0390 Unspecified dementia without behavioral disturbance: Secondary | ICD-10-CM | POA: Diagnosis not present

## 2018-12-06 DIAGNOSIS — S51012D Laceration without foreign body of left elbow, subsequent encounter: Secondary | ICD-10-CM | POA: Diagnosis not present

## 2018-12-06 DIAGNOSIS — L03112 Cellulitis of left axilla: Secondary | ICD-10-CM | POA: Diagnosis not present

## 2018-12-08 DIAGNOSIS — I82402 Acute embolism and thrombosis of unspecified deep veins of left lower extremity: Secondary | ICD-10-CM | POA: Diagnosis not present

## 2018-12-08 DIAGNOSIS — L03112 Cellulitis of left axilla: Secondary | ICD-10-CM | POA: Diagnosis not present

## 2018-12-08 DIAGNOSIS — S51012D Laceration without foreign body of left elbow, subsequent encounter: Secondary | ICD-10-CM | POA: Diagnosis not present

## 2018-12-08 DIAGNOSIS — F0390 Unspecified dementia without behavioral disturbance: Secondary | ICD-10-CM | POA: Diagnosis not present

## 2018-12-08 DIAGNOSIS — I10 Essential (primary) hypertension: Secondary | ICD-10-CM | POA: Diagnosis not present

## 2018-12-08 DIAGNOSIS — Z4800 Encounter for change or removal of nonsurgical wound dressing: Secondary | ICD-10-CM | POA: Diagnosis not present

## 2018-12-12 DIAGNOSIS — F0390 Unspecified dementia without behavioral disturbance: Secondary | ICD-10-CM | POA: Diagnosis not present

## 2018-12-12 DIAGNOSIS — L03112 Cellulitis of left axilla: Secondary | ICD-10-CM | POA: Diagnosis not present

## 2018-12-12 DIAGNOSIS — I82402 Acute embolism and thrombosis of unspecified deep veins of left lower extremity: Secondary | ICD-10-CM | POA: Diagnosis not present

## 2018-12-12 DIAGNOSIS — I10 Essential (primary) hypertension: Secondary | ICD-10-CM | POA: Diagnosis not present

## 2018-12-12 DIAGNOSIS — Z4800 Encounter for change or removal of nonsurgical wound dressing: Secondary | ICD-10-CM | POA: Diagnosis not present

## 2018-12-12 DIAGNOSIS — S51012D Laceration without foreign body of left elbow, subsequent encounter: Secondary | ICD-10-CM | POA: Diagnosis not present

## 2018-12-13 DIAGNOSIS — Z4800 Encounter for change or removal of nonsurgical wound dressing: Secondary | ICD-10-CM | POA: Diagnosis not present

## 2018-12-13 DIAGNOSIS — I82402 Acute embolism and thrombosis of unspecified deep veins of left lower extremity: Secondary | ICD-10-CM | POA: Diagnosis not present

## 2018-12-13 DIAGNOSIS — I10 Essential (primary) hypertension: Secondary | ICD-10-CM | POA: Diagnosis not present

## 2018-12-13 DIAGNOSIS — F0390 Unspecified dementia without behavioral disturbance: Secondary | ICD-10-CM | POA: Diagnosis not present

## 2018-12-13 DIAGNOSIS — L03112 Cellulitis of left axilla: Secondary | ICD-10-CM | POA: Diagnosis not present

## 2018-12-13 DIAGNOSIS — S51012D Laceration without foreign body of left elbow, subsequent encounter: Secondary | ICD-10-CM | POA: Diagnosis not present

## 2018-12-15 DIAGNOSIS — F0390 Unspecified dementia without behavioral disturbance: Secondary | ICD-10-CM | POA: Diagnosis not present

## 2018-12-15 DIAGNOSIS — I82402 Acute embolism and thrombosis of unspecified deep veins of left lower extremity: Secondary | ICD-10-CM | POA: Diagnosis not present

## 2018-12-15 DIAGNOSIS — S51012D Laceration without foreign body of left elbow, subsequent encounter: Secondary | ICD-10-CM | POA: Diagnosis not present

## 2018-12-15 DIAGNOSIS — L03112 Cellulitis of left axilla: Secondary | ICD-10-CM | POA: Diagnosis not present

## 2018-12-15 DIAGNOSIS — Z4800 Encounter for change or removal of nonsurgical wound dressing: Secondary | ICD-10-CM | POA: Diagnosis not present

## 2018-12-15 DIAGNOSIS — I10 Essential (primary) hypertension: Secondary | ICD-10-CM | POA: Diagnosis not present

## 2018-12-18 DIAGNOSIS — F0390 Unspecified dementia without behavioral disturbance: Secondary | ICD-10-CM | POA: Diagnosis not present

## 2018-12-18 DIAGNOSIS — S51012D Laceration without foreign body of left elbow, subsequent encounter: Secondary | ICD-10-CM | POA: Diagnosis not present

## 2018-12-18 DIAGNOSIS — I10 Essential (primary) hypertension: Secondary | ICD-10-CM | POA: Diagnosis not present

## 2018-12-18 DIAGNOSIS — L03112 Cellulitis of left axilla: Secondary | ICD-10-CM | POA: Diagnosis not present

## 2018-12-18 DIAGNOSIS — I82402 Acute embolism and thrombosis of unspecified deep veins of left lower extremity: Secondary | ICD-10-CM | POA: Diagnosis not present

## 2018-12-18 DIAGNOSIS — Z4800 Encounter for change or removal of nonsurgical wound dressing: Secondary | ICD-10-CM | POA: Diagnosis not present

## 2018-12-19 ENCOUNTER — Other Ambulatory Visit: Payer: Self-pay

## 2018-12-19 ENCOUNTER — Encounter: Payer: Self-pay | Admitting: Gastroenterology

## 2018-12-19 ENCOUNTER — Ambulatory Visit (INDEPENDENT_AMBULATORY_CARE_PROVIDER_SITE_OTHER): Payer: Medicare Other | Admitting: Gastroenterology

## 2018-12-19 VITALS — BP 159/69 | HR 90 | Temp 98.8°F | Resp 17 | Ht 64.0 in | Wt 144.4 lb

## 2018-12-19 DIAGNOSIS — Z9049 Acquired absence of other specified parts of digestive tract: Secondary | ICD-10-CM

## 2018-12-19 DIAGNOSIS — D509 Iron deficiency anemia, unspecified: Secondary | ICD-10-CM

## 2018-12-19 NOTE — Progress Notes (Signed)
Cephas Darby, MD [address]  [place]  [place], [place] [address]  Main: [phone]  Fax: [phone]    Gastroenterology Consultation  Referring Provider:     [name], MD Primary Care Physician:  [name], MD Primary Gastroenterologist:  Dr. Cephas Darby Reason for Consultation:     Iron deficiency anemia        HPI:   Kerri Ramirez is a 85 y.o. female referred by Dr. Brita Romp, Dionne Bucy, MD  for consultation & management of chronic iron deficiency anemia.  Patient has history of mild dementia, limited mobility but able to take care of basic ADLs, s/p right hemicolectomy on 07/26/2013 secondary to refractory diverticular bleed, Left lower extremity DVT diagnosed in 08/2018 on Eliquis was admitted with severe symptomatic anemia, exertional dyspnea on 11/25/2018.  Patient wass found to have hemoglobin of 6.6 on admission.  No known melena, hematemesis, coffee-ground emesis or rectal bleeding.  Patient lives at home with her husband.  Her daughter is the power of attorney who provided most of the history. Apparently, on chart review patient is found to have chronic iron deficiency anemia dating back to 11/2015 and not on iron replacement therapy according to her daughter.  She also has chronic anemia dating back to 2015.  Her most recent hemoglobin was 7.4 on 01/2017, gradually declined from 8.8 in 08/2018 to 7.4 in 09/2018 to-6.6 on admission.  She was also found to have gradually declining renal function, BUN/creatinine 75/2.91 on admission.  She was managed conservatively on pantoprazole drip, received blood transfusion and responded appropriately.  Her AKI resolved.  Both patient and her daughter choose to defer endoscopic evaluation as she was not actively bleeding and to be managed as outpatient if needed.  Patient's hemoglobin was 7.7 on 10/13.  Improved to 8.5 on 10/19 at her PCPs office.  She is discharged home on Protonix and Eliquis was  discontinued as there was no evidence of lower extremity DVT based on the Dopplers during her admission.  Patient is accompanied by her daughter today.  She reports feeling great.  She denies any GI symptoms today.  She is currently taking oral iron twice daily and reports her stools are dark but not black.  She feels her energy levels are improving.  NSAIDs: None  Antiplts/Anticoagulants/Anti thrombotics: None  GI Procedures: Colonoscopy several years ago  Past Medical History:  Diagnosis Date   Dementia ([place])    DVT (deep vein thrombosis) in pregnancy    Hypertension     Past Surgical History:  Procedure Laterality Date   ANKLE SURGERY Left [place]   broken vertebra   COLONOSCOPY     Dr Vira Agar   HEMICOLECTOMY Right 07-25-13   Dr Byrd/diverticulitis   IVC FILTER PLACEMENT ([place] [facility]X)  09/01/2013   Current Outpatient Medications:    ferrous sulfate 325 (65 FE) MG tablet, Take 1 tablet (325 mg total) by mouth daily., Disp: 30 tablet, Rfl: 0   memantine (NAMENDA) 5 MG tablet, Take 1 tablet by mouth twice daily (Patient taking differently: Take 5 mg by mouth 2 (two) times daily. ), Disp: 180 tablet, Rfl: 0   Omega-3 Fatty Acids (FISH OIL) 1000 MG CAPS, Take 1,000 mg by mouth daily. , Disp: , Rfl:    pantoprazole (PROTONIX) 40 MG tablet, Take 1 tablet (40 mg total) by mouth 2 (two) times daily., Disp: 60 tablet, Rfl: 0   vitamin B-12 (CYANOCOBALAMIN) 1000 MCG  tablet, Take 1,000 mcg by mouth daily. , Disp: , Rfl:    Family History  Problem Relation Age of Onset   Heart attack Father    Heart attack Brother    Heart attack Brother      Social History   Tobacco Use   Smoking status: Former Smoker    Years: 2.00    Types: Cigarettes    Quit date: 02/16/1944    Years since quitting: 74.8   Smokeless tobacco: Never Used  Substance Use Topics   Alcohol use: No   Drug use: No    Allergies as of 12/19/2018 - Review Complete 12/19/2018    Allergen Reaction Noted   Codeine Other (See Comments) 07/02/2013    Review of Systems:    All systems reviewed and negative except where noted in HPI.   Physical Exam:  BP (!) 159/69 (BP Location: Left Arm, Patient Position: Sitting, Cuff Size: Large)    Pulse 90    Temp 98.8 F (37.1 C)    Resp 17    Ht 5\' 4"  (1.626 m)    Wt 144 lb 6.4 oz (65.5 kg)    BMI 24.79 kg/m  No LMP recorded. Patient is postmenopausal.  General:   Alert,  Well-developed, well-nourished, pleasant and cooperative in NAD Head:  Normocephalic and atraumatic. Eyes:  Sclera clear, no icterus.   Conjunctiva pale. Ears: Decreased auditory acuity. Nose:  No deformity, discharge, or lesions. Mouth:  No deformity or lesions,oropharynx pink & moist. Neck:  Supple; no masses or thyromegaly. Lungs:  Respirations even and unlabored.  Clear throughout to auscultation.   No wheezes, crackles, or rhonchi. No acute distress. Heart:  Regular rate and rhythm; no murmurs, clicks, rubs, or gallops. Abdomen:  Normal bowel sounds. Soft, non-tender and non-distended without masses, hepatosplenomegaly or hernias noted.  No guarding or rebound tenderness.   Rectal: Not performed Msk:  Symmetrical without gross deformities. Good, equal movement & strength bilaterally. Pulses:  Normal pulses noted. Extremities:  No clubbing or edema.  No cyanosis. Neurologic:  Alert and oriented x3;  grossly normal neurologically. Skin:  Intact without significant lesions or rashes. No jaundice. Psych:  Alert and cooperative. Normal mood and affect.  Imaging Studies: Reviewed  Assessment and Plan:   Kerri Ramirez is a 85 y.o. female with history of right hemicolectomy in 2015 secondary to refractory diverticular bleed, mild dementia, left lower extremity DVT previously on Eliquis, chronic iron deficiency anemia.  She does not demonstrate any symptoms of active GI bleed.  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  She is currently responding to oral iron replacement.  Patient likes to defer endoscopic evaluation as she continues to feel well clinically and I think this is reasonable approach given her age and limited functional capacity.  Continue oral iron 2 times a day and recheck labs today Continue Protonix for now  Follow up in 2 months   2016, MD

## 2018-12-20 LAB — CBC
Hematocrit: 30.2 % — ABNORMAL LOW (ref 34.0–46.6)
Hemoglobin: 9.9 g/dL — ABNORMAL LOW (ref 11.1–15.9)
MCH: 27.5 pg (ref 26.6–33.0)
MCHC: 32.8 g/dL (ref 31.5–35.7)
MCV: 84 fL (ref 79–97)
Platelets: 307 10*3/uL (ref 150–450)
RBC: 3.6 x10E6/uL — ABNORMAL LOW (ref 3.77–5.28)
RDW: 18.2 % — ABNORMAL HIGH (ref 11.7–15.4)
WBC: 8.1 10*3/uL (ref 3.4–10.8)

## 2018-12-20 LAB — FERRITIN: Ferritin: 110 ng/mL (ref 15–150)

## 2018-12-20 LAB — IRON AND TIBC
Iron Saturation: 26 % (ref 15–55)
Iron: 67 ug/dL (ref 27–139)
Total Iron Binding Capacity: 255 ug/dL (ref 250–450)
UIBC: 188 ug/dL (ref 118–369)

## 2018-12-21 DIAGNOSIS — S51012D Laceration without foreign body of left elbow, subsequent encounter: Secondary | ICD-10-CM | POA: Diagnosis not present

## 2018-12-21 DIAGNOSIS — F0390 Unspecified dementia without behavioral disturbance: Secondary | ICD-10-CM | POA: Diagnosis not present

## 2018-12-21 DIAGNOSIS — L03112 Cellulitis of left axilla: Secondary | ICD-10-CM | POA: Diagnosis not present

## 2018-12-21 DIAGNOSIS — I82402 Acute embolism and thrombosis of unspecified deep veins of left lower extremity: Secondary | ICD-10-CM | POA: Diagnosis not present

## 2018-12-21 DIAGNOSIS — I10 Essential (primary) hypertension: Secondary | ICD-10-CM | POA: Diagnosis not present

## 2018-12-21 DIAGNOSIS — Z4800 Encounter for change or removal of nonsurgical wound dressing: Secondary | ICD-10-CM | POA: Diagnosis not present

## 2018-12-22 ENCOUNTER — Other Ambulatory Visit: Payer: Self-pay | Admitting: Internal Medicine

## 2018-12-22 DIAGNOSIS — F0280 Dementia in other diseases classified elsewhere without behavioral disturbance: Secondary | ICD-10-CM | POA: Diagnosis not present

## 2018-12-22 DIAGNOSIS — Z792 Long term (current) use of antibiotics: Secondary | ICD-10-CM | POA: Diagnosis not present

## 2018-12-22 DIAGNOSIS — R7303 Prediabetes: Secondary | ICD-10-CM | POA: Diagnosis not present

## 2018-12-22 DIAGNOSIS — E785 Hyperlipidemia, unspecified: Secondary | ICD-10-CM | POA: Diagnosis not present

## 2018-12-22 DIAGNOSIS — K219 Gastro-esophageal reflux disease without esophagitis: Secondary | ICD-10-CM | POA: Diagnosis not present

## 2018-12-22 DIAGNOSIS — I82402 Acute embolism and thrombosis of unspecified deep veins of left lower extremity: Secondary | ICD-10-CM | POA: Diagnosis not present

## 2018-12-22 DIAGNOSIS — K559 Vascular disorder of intestine, unspecified: Secondary | ICD-10-CM | POA: Diagnosis not present

## 2018-12-22 DIAGNOSIS — Z87891 Personal history of nicotine dependence: Secondary | ICD-10-CM | POA: Diagnosis not present

## 2018-12-22 DIAGNOSIS — M810 Age-related osteoporosis without current pathological fracture: Secondary | ICD-10-CM | POA: Diagnosis not present

## 2018-12-22 DIAGNOSIS — D509 Iron deficiency anemia, unspecified: Secondary | ICD-10-CM | POA: Diagnosis not present

## 2018-12-22 DIAGNOSIS — Z9181 History of falling: Secondary | ICD-10-CM | POA: Diagnosis not present

## 2018-12-22 DIAGNOSIS — I10 Essential (primary) hypertension: Secondary | ICD-10-CM | POA: Diagnosis not present

## 2018-12-22 DIAGNOSIS — M109 Gout, unspecified: Secondary | ICD-10-CM | POA: Diagnosis not present

## 2018-12-22 DIAGNOSIS — Z7901 Long term (current) use of anticoagulants: Secondary | ICD-10-CM | POA: Diagnosis not present

## 2018-12-22 DIAGNOSIS — L03112 Cellulitis of left axilla: Secondary | ICD-10-CM | POA: Diagnosis not present

## 2018-12-22 DIAGNOSIS — S51012D Laceration without foreign body of left elbow, subsequent encounter: Secondary | ICD-10-CM | POA: Diagnosis not present

## 2018-12-22 DIAGNOSIS — G309 Alzheimer's disease, unspecified: Secondary | ICD-10-CM | POA: Diagnosis not present

## 2018-12-26 ENCOUNTER — Telehealth: Payer: Self-pay | Admitting: *Deleted

## 2018-12-26 MED ORDER — PANTOPRAZOLE SODIUM 40 MG PO TBEC: 40.0000 mg | DELAYED_RELEASE_TABLET | Freq: Two times a day (BID) | ORAL | 1 refills | Status: AC

## 2018-12-26 MED ORDER — FERROUS SULFATE 325 (65 FE) MG PO TABS: 325.0000 mg | ORAL_TABLET | ORAL | 1 refills | Status: AC

## 2018-12-26 NOTE — Telephone Encounter (Signed)
OK to cancel if GI is following up on this

## 2018-12-26 NOTE — Telephone Encounter (Signed)
FYI..   Neoma Laming advised.  Apt for 01/08/2019 canceled.  She also reported that Ms. Astorino needed refills on pantoprazole and Iron.  I sent the refills to [facility].   Thanks,   -Mickel Baas

## 2018-12-26 NOTE — Telephone Encounter (Signed)
Patient's daughter Neoma Laming called concerning pt's appt on 01/08/2019. Neoma Laming wants to know if pt needs to keep the 4 weeks follow up appt. Patient had labs done at GI office last week. Please advise?

## 2019-01-04 ENCOUNTER — Other Ambulatory Visit: Payer: Self-pay | Admitting: Family Medicine

## 2019-01-08 ENCOUNTER — Ambulatory Visit: Payer: Medicare Other | Admitting: Family Medicine

## 2019-01-18 ENCOUNTER — Ambulatory Visit: Payer: Medicare Other | Admitting: Gastroenterology

## 2019-03-06 ENCOUNTER — Ambulatory Visit: Payer: Medicare Other | Admitting: Gastroenterology

## 2019-03-08 ENCOUNTER — Ambulatory Visit: Payer: Medicare Other

## 2019-03-08 ENCOUNTER — Encounter: Payer: Medicare Other | Admitting: Family Medicine

## 2019-03-08 NOTE — Progress Notes (Deleted)
Subjective:   Kerri Ramirez is a 86 y.o. female who presents for Medicare Annual (Subsequent) preventive examination.    This visit is being conducted through telemedicine due to the COVID-19 pandemic. This patient has given me verbal consent via doximity to conduct this visit, patient states they are participating from their home address. Some vital signs may be absent or patient reported.    Patient identification: identified by name, DOB, and current address  Review of Systems:  N/A        Objective:     Vitals: There were no vitals taken for this visit.  There is no height or weight on file to calculate BMI. Unable to obtain vitals due to visit being conducted via telephonically.   Advanced Directives 11/25/2018 03/02/2018 02/24/2017 02/24/2016 10/29/2014  Does Patient Have a Medical Advance Directive? Yes Yes Yes Yes Yes  Type of Estate agent of [employer] Power of [place];Living will Healthcare Power of [place];Living will Healthcare Power of [place];Living will Healthcare Power of [place];Living will  Does patient want to make changes to medical advance directive? No - Patient declined - - - -  Copy of Healthcare Power of Attorney in Chart? Yes - validated most recent copy scanned in chart (See row information) Yes - validated most recent copy scanned in chart (See row information) No - copy requested Yes -    Tobacco Social History   Tobacco Use  Smoking Status Former Smoker  . Years: 2.00  . Types: Cigarettes  . Quit date: 02/16/1944  . Years since quitting: 75.1  Smokeless Tobacco Never Used     Counseling given: Not Answered   Clinical Intake:                       Past Medical History:  Diagnosis Date  . Dementia (HCC)   . DVT (deep vein thrombosis) in pregnancy   . Hypertension    Past Surgical History:  Procedure Laterality Date  . ANKLE SURGERY Left 1981  . BACK SURGERY  1987   broken vertebra  .  COLONOSCOPY     Dr Mechele Collin  . HEMICOLECTOMY Right 07-25-13   Dr Byrd/diverticulitis  . IVC FILTER PLACEMENT ([facility] HX)  09/01/2013   Family History  Problem Relation Age of Onset  . Heart attack Father   . Heart attack Brother   . Heart attack Brother    Social History   Socioeconomic History  . Marital status: Married    Spouse name: Not on file  . Number of children: 3  . Years of education: Not on file  . Highest education level: 6th grade  Occupational History  . Occupation: retired  Tobacco Use  . Smoking status: Former Smoker    Years: 2.00    Types: Cigarettes    Quit date: 02/16/1944    Years since quitting: 75.1  . Smokeless tobacco: Never Used  Substance and Sexual Activity  . Alcohol use: No  . Drug use: No  . Sexual activity: Not on file  Other Topics Concern  . Not on file  Social History Narrative   1 son deceased- suicide   Social Determinants of Health   Financial Resource Strain:   . Difficulty of Paying Living Expenses: Not on file  Food Insecurity:   . Worried About Programme researcher, broadcasting/film/video in the Last Year: Not on file  . Ran Out of Food in the Last Year: Not on file  Transportation Needs:   .  Lack of Transportation (Medical): Not on file  . Lack of Transportation (Non-Medical): Not on file  Physical Activity:   . Days of Exercise per Week: Not on file  . Minutes of Exercise per Session: Not on file  Stress:   . Feeling of Stress : Not on file  Social Connections:   . Frequency of Communication with Friends and Family: Not on file  . Frequency of Social Gatherings with Friends and Family: Not on file  . Attends Religious Services: Not on file  . Active Member of Clubs or Organizations: Not on file  . Attends Archivist Meetings: Not on file  . Marital Status: Not on file    Outpatient Encounter Medications as of 03/12/2019  Medication Sig  . ferrous sulfate 325 (65 FE) MG tablet Take 1 tablet (325 mg total) by mouth every other day.    . memantine (NAMENDA) 5 MG tablet Take 1 tablet by mouth twice daily  . Omega-3 Fatty Acids (FISH OIL) 1000 MG CAPS Take 1,000 mg by mouth daily.   . pantoprazole (PROTONIX) 40 MG tablet Take 1 tablet (40 mg total) by mouth 2 (two) times daily.  . vitamin B-12 (CYANOCOBALAMIN) 1000 MCG tablet Take 1,000 mcg by mouth daily.    No facility-administered encounter medications on file as of 03/12/2019.    Activities of Daily Living In your present state of health, do you have any difficulty performing the following activities: 11/25/2018  Hearing? N  Vision? N  Difficulty concentrating or making decisions? N  Walking or climbing stairs? N  Dressing or bathing? N  Doing errands, shopping? N  Some recent data might be hidden    Patient Care Team: [name], MD as PCP - General (Family Medicine) Bary Castilla Forest Gleason, MD (General Surgery)    Assessment:   This is a routine wellness examination for [facility].  Exercise Activities and Dietary recommendations    Goals    . DIET - INCREASE WATER INTAKE     Recommend to continue drinking 6-8 glasses of water a day. Pt declined changing diet or exercising.     . Increase water intake     Starting 02/24/16, I will increase my water intake to 6 glasses a day.       Fall Risk: Fall Risk  03/02/2018 02/24/2017 02/24/2016 10/29/2014  Falls in the past year? 1 No No No  Number falls in past yr: 1 - - -  Injury with Fall? 0 - - -  Risk for fall due to : Impaired mobility;Impaired balance/gait - - -  Follow up Falls prevention discussed - - -    FALL RISK PREVENTION PERTAINING TO THE HOME:  Any stairs in or around the home? {YES/NO:21197} If so, are there any without handrails? {YES/NO:21197}  Home free of loose throw rugs in walkways, pet beds, electrical cords, etc? Yes  Adequate lighting in your home to reduce risk of falls? Yes   ASSISTIVE DEVICES UTILIZED TO PREVENT FALLS:  Life alert? {YES/NO:21197} Use of a cane, walker  or w/c? {YES/NO:21197} Grab bars in the bathroom? {YES/NO:21197} Shower chair or bench in shower? {YES/NO:21197} Elevated toilet seat or a handicapped toilet? {YES/NO:21197}   TIMED UP AND GO:  Was the test performed? No .    Depression Screen PHQ 2/9 Scores 03/02/2018 02/24/2017 02/24/2016 10/29/2014  PHQ - 2 Score 5 0 0 0  PHQ- 9 Score 17 - - -     Cognitive Function MMSE - Mini Mental State  Exam 01/17/2017  Orientation to time 3  Orientation to Place 5  Registration 3  Attention/ Calculation 4  Recall 1  Language- name 2 objects 2  Language- repeat 0  Language- follow 3 step command 2  Language- read & follow direction 1  Write a sentence 1  Copy design 0  Total score 22     6CIT Screen 03/02/2018 02/24/2016  What Year? 4 points 0 points  What month? 3 points 0 points  What time? 3 points 0 points  Count back from 20 0 points 2 points  Months in reverse 2 points 0 points  Repeat phrase 10 points 6 points  Total Score 22 8    Immunization History  Administered Date(s) Administered  . Fluad Quad(high Dose 65+) 12/04/2018  . Influenza Split 11/10/2011  . Influenza, High Dose Seasonal PF 12/03/2013, 10/29/2014, 11/28/2015, 12/01/2016, 11/01/2017  . Influenza,inj,Quad PF,6+ Mos 12/06/2012  . Influenza-Unspecified 11/10/2011, 12/06/2012, 12/03/2013, 10/29/2014  . Pneumococcal Conjugate-13 12/03/2013  . Pneumococcal Polysaccharide-23 03/02/2018    Qualifies for Shingles Vaccine? Yes . Due for Shingrix. Pt has been advised to call insurance company to determine out of pocket expense. Advised may also receive vaccine at local pharmacy or Health Dept. Verbalized acceptance and understanding.  Tdap: Although this vaccine is not a covered service during a Wellness Exam, does the patient still wish to receive this vaccine today?  No . Advised may receive this vaccine at local pharmacy or Health Dept. Aware to provide a copy of the vaccination record if obtained from local  pharmacy or Health Dept. Verbalized acceptance and understanding.  Flu Vaccine: Up to date  Pneumococcal Vaccine: Completed series  Screening Tests Health Maintenance  Topic Date Due  . TETANUS/TDAP  07/08/1944  . DEXA SCAN  04/23/2020  . INFLUENZA VACCINE  Completed  . PNA vac Low Risk Adult  Completed    Cancer Screenings:  Colorectal Screening: No longer required.   Mammogram: No longer required.   Bone Density: Completed 04/24/18. Results reflect OSTEOPOROSIS. Repeat every 2 years.   Lung Cancer Screening: (Low Dose CT Chest recommended if Age 21-80 years, 30 pack-year currently smoking OR have quit w/in 15years.) does not qualify.   Additional Screening:  Vision Screening: Recommended annual ophthalmology exams for early detection of glaucoma and other disorders of the eye.  Dental Screening: Recommended annual dental exams for proper oral hygiene  Community Resource Referral:  CRR required this visit?  No       Plan:  I have personally reviewed and addressed the Medicare Annual Wellness questionnaire and have noted the following in the patient's chart:  A. Medical and social history B. Use of alcohol, tobacco or illicit drugs  C. Current medications and supplements D. Functional ability and status E.  Nutritional status F.  Physical activity G. Advance directives H. List of other physicians I.  Hospitalizations, surgeries, and ER visits in previous 12 months J.  Vitals K. Screenings such as hearing and vision if needed, cognitive and depression L. Referrals and appointments   In addition, I have reviewed and discussed with patient certain preventive protocols, quality metrics, and best practice recommendations. A written personalized care plan for preventive services as well as general preventive health recommendations were provided to patient. Nurse Health Advisor  Signed,    Wolfe C[place], [place]  [id number] Nurse Health Advisor   Nurse Notes:  ***

## 2019-03-09 ENCOUNTER — Ambulatory Visit: Payer: Self-pay | Admitting: Family Medicine

## 2019-03-15 NOTE — Progress Notes (Signed)
Subjective:   Kerri Ramirez is a 86 y.o. female who presents for Medicare Annual (Subsequent) preventive examination.    This visit is being conducted through telemedicine due to the COVID-19 pandemic. This patient has given me verbal consent via doximity to conduct this visit, patient states they are participating from their home address. Some vital signs may be absent or patient reported.    Patient identification: identified by name, DOB, and current address  Review of Systems:  N/A  Cardiac Risk Factors include: advanced age (>66men, >42 women);dyslipidemia;hypertension     Objective:     Vitals: There were no vitals taken for this visit.  There is no height or weight on file to calculate BMI. Unable to obtain vitals due to visit being conducted via telephonically.   Advanced Directives 03/19/2019 11/25/2018 03/02/2018 02/24/2017 02/24/2016 10/29/2014  Does Patient Have a Medical Advance Directive? Yes Yes Yes Yes Yes Yes  Type of Estate agent of [place];Living will Healthcare Power of [employer] of [place];Living will Healthcare Power of [place];Living will Healthcare Power of [place];Living will Healthcare Power of [place];Living will  Does patient want to make changes to medical advance directive? - No - Patient declined - - - -  Copy of Healthcare Power of Attorney in Chart? Yes - validated most recent copy scanned in chart (See row information) Yes - validated most recent copy scanned in chart (See row information) Yes - validated most recent copy scanned in chart (See row information) No - copy requested Yes -    Tobacco Social History   Tobacco Use  Smoking Status Former Smoker  . Years: 2.00  . Types: Cigarettes  . Quit date: 02/16/1944  . Years since quitting: 75.1  Smokeless Tobacco Never Used     Counseling given: Not Answered   Clinical Intake:  Pre-visit preparation completed: Yes  Pain : No/denies pain Pain  Score: 0-No pain     Nutritional Risks: None Diabetes: No  How often do you need to have someone help you when you read instructions, pamphlets, or other written materials from your doctor or pharmacy?: 1 - Never  Interpreter Needed?: No  Information entered by :: [facility], LPN  Past Medical History:  Diagnosis Date  . Anemia   . Dementia (HCC)   . DVT (deep vein thrombosis) in pregnancy   . Hyperlipidemia   . Hypertension    Past Surgical History:  Procedure Laterality Date  . ANKLE SURGERY Left 1981  . BACK SURGERY  1987   broken vertebra  . COLONOSCOPY     Dr Mechele Collin  . HEMICOLECTOMY Right 07-25-13   Dr Byrd/diverticulitis  . IVC FILTER PLACEMENT ([facility] HX)  09/01/2013   Family History  Problem Relation Age of Onset  . Heart attack Father   . Heart attack Brother   . Heart attack Brother    Social History   Socioeconomic History  . Marital status: Married    Spouse name: Not on file  . Number of children: 3  . Years of education: Not on file  . Highest education level: 6th grade  Occupational History  . Occupation: retired  Tobacco Use  . Smoking status: Former Smoker    Years: 2.00    Types: Cigarettes    Quit date: 02/16/1944    Years since quitting: 75.1  . Smokeless tobacco: Never Used  Substance and Sexual Activity  . Alcohol use: No  . Drug use: No  . Sexual activity: Not on file  Other Topics Concern  . Not on file  Social History Narrative   1 son deceased- suicide   Social Determinants of Health   Financial Resource Strain: Low Risk   . Difficulty of Paying Living Expenses: Not hard at all  Food Insecurity: No Food Insecurity  . Worried About Charity fundraiser in the Last Year: Never true  . Ran Out of Food in the Last Year: Never true  Transportation Needs: No Transportation Needs  . Lack of Transportation (Medical): No  . Lack of Transportation (Non-Medical): No  Physical Activity: Inactive  . Days of Exercise per Week: 0 days    . Minutes of Exercise per Session: 0 min  Stress: No Stress Concern Present  . Feeling of Stress : Not at all  Social Connections: Somewhat Isolated  . Frequency of Communication with Friends and Family: More than three times a week  . Frequency of Social Gatherings with Friends and Family: Three times a week  . Attends Religious Services: Never  . Active Member of Clubs or Organizations: No  . Attends Archivist Meetings: Never  . Marital Status: Married    Outpatient Encounter Medications as of 03/19/2019  Medication Sig  . Ascorbic Acid (VITAMIN C) 1000 MG tablet Take 1,000 mg by mouth daily.  . ferrous sulfate 325 (65 FE) MG tablet Take 1 tablet (325 mg total) by mouth every other day.  . Lactobacillus ([employer] PROBIOTIC ACIDOPHILUS PO) Take by mouth daily.  . memantine (NAMENDA) 5 MG tablet Take 1 tablet by mouth twice daily  . Omega-3 Fatty Acids (FISH OIL) 1000 MG CAPS Take 1,000 mg by mouth daily.   . pantoprazole (PROTONIX) 40 MG tablet Take 1 tablet (40 mg total) by mouth 2 (two) times daily.  . vitamin B-12 (CYANOCOBALAMIN) 1000 MCG tablet Take 1,000 mcg by mouth daily.    No facility-administered encounter medications on file as of 03/19/2019.    Activities of Daily Living In your present state of health, do you have any difficulty performing the following activities: 03/19/2019 11/25/2018  Hearing? Y N  Comment Does not wear hearing aids. -  Vision? N N  Difficulty concentrating or making decisions? Y N  Walking or climbing stairs? N N  Dressing or bathing? N N  Doing errands, shopping? Y N  Comment Does not drive. -  Preparing Food and eating ? Y -  Comment Does not cook. -  Using the Toilet? N -  In the past six months, have you accidently leaked urine? N -  Do you have problems with loss of bowel control? N -  Managing your Medications? Y -  Comment Daughter manages medications. -  Managing your Finances? Y -  Comment Daughter manages finances. -   Housekeeping or managing your Housekeeping? Y -  Comment Husband assists with cleaning. -  Some recent data might be hidden    Patient Care Team: Brita Romp Dionne Bucy, MD as PCP - General (Family Medicine) Lin Landsman, MD as Consulting Physician (Gastroenterology)    Assessment:   This is a routine wellness examination for [facility].  Exercise Activities and Dietary recommendations Current Exercise Habits: The patient does not participate in regular exercise at present, Exercise limited by: orthopedic condition(s)  Goals    . DIET - INCREASE WATER INTAKE     Recommend to continue drinking 6-8 glasses of water a day. Pt declined changing diet or exercising.     Marland Kitchen LIFESTYLE - DECREASE FALLS RISK  Recommend to remove any items from the home that may cause slips or trips.       Fall Risk: Fall Risk  03/19/2019 03/02/2018 02/24/2017 02/24/2016 10/29/2014  Falls in the past year? 1 1 No No No  Number falls in past yr: 0 1 - - -  Injury with Fall? 0 0 - - -  Risk for fall due to : - Impaired mobility;Impaired balance/gait - - -  Follow up Falls prevention discussed Falls prevention discussed - - -    FALL RISK PREVENTION PERTAINING TO THE HOME:  Any stairs in or around the home? Yes  If so, are there any without handrails? No   Home free of loose throw rugs in walkways, pet beds, electrical cords, etc? Yes  Adequate lighting in your home to reduce risk of falls? Yes   ASSISTIVE DEVICES UTILIZED TO PREVENT FALLS:  Life alert? No  Use of a cane, walker or w/c? Yes  Grab bars in the bathroom? No  Shower chair or bench in shower? Yes  Elevated toilet seat or a handicapped toilet? Yes    TIMED UP AND GO:  Was the test performed? No .    Depression Screen PHQ 2/9 Scores 03/19/2019 03/19/2019 03/02/2018 02/24/2017  PHQ - 2 Score 0 0 5 0  PHQ- 9 Score - - 17 -     Cognitive Function: Declined today.   MMSE - Mini Mental State Exam 01/17/2017  Orientation to time 3   Orientation to Place 5  Registration 3  Attention/ Calculation 4  Recall 1  Language- name 2 objects 2  Language- repeat 0  Language- follow 3 step command 2  Language- read & follow direction 1  Write a sentence 1  Copy design 0  Total score 22     6CIT Screen 03/02/2018 02/24/2016  What Year? 4 points 0 points  What month? 3 points 0 points  What time? 3 points 0 points  Count back from 20 0 points 2 points  Months in reverse 2 points 0 points  Repeat phrase 10 points 6 points  Total Score 22 8    Immunization History  Administered Date(s) Administered  . Fluad Quad(high Dose 65+) 12/04/2018  . Influenza Split 11/10/2011  . Influenza, High Dose Seasonal PF 12/03/2013, 10/29/2014, 11/28/2015, 12/01/2016, 11/01/2017  . Influenza,inj,Quad PF,6+ Mos 12/06/2012  . Influenza-Unspecified 11/10/2011, 12/06/2012, 12/03/2013, 10/29/2014  . Pneumococcal Conjugate-13 12/03/2013  . Pneumococcal Polysaccharide-23 03/02/2018    Qualifies for Shingles Vaccine? Yes . Due for Shingrix. Pt has been advised to call insurance company to determine out of pocket expense. Advised may also receive vaccine at local pharmacy or Health Dept. Verbalized acceptance and understanding.  Tdap: Although this vaccine is not a covered service during a Wellness Exam, does the patient still wish to receive this vaccine today?  No . Advised may receive this vaccine at local pharmacy or Health Dept. Aware to provide a copy of the vaccination record if obtained from local pharmacy or Health Dept. Verbalized acceptance and understanding.  Flu Vaccine: Up to date  Pneumococcal Vaccine: Completed series  Screening Tests Health Maintenance  Topic Date Due  . TETANUS/TDAP  03/18/2020 (Originally 07/08/1944)  . DEXA SCAN  04/23/2020  . INFLUENZA VACCINE  Completed  . PNA vac Low Risk Adult  Completed    Cancer Screenings:  Colorectal Screening: No longer required.   Mammogram: No longer required.   Bone  Density: Completed 04/24/18. Results reflect OSTEOPOROSIS. Repeat every 2 years.  Lung Cancer Screening: (Low Dose CT Chest recommended if Age 69-80 years, 30 pack-year currently smoking OR have quit w/in 15years.) does not qualify.   Additional Screening:  Vision Screening: Recommended annual ophthalmology exams for early detection of glaucoma and other disorders of the eye.  Dental Screening: Recommended annual dental exams for proper oral hygiene  Community Resource Referral:  CRR required this visit?  No       Plan:  I have personally reviewed and addressed the Medicare Annual Wellness questionnaire and have noted the following in the patient's chart:  A. Medical and social history B. Use of alcohol, tobacco or illicit drugs  C. Current medications and supplements D. Functional ability and status E.  Nutritional status F.  Physical activity G. Advance directives H. List of other physicians I.  Hospitalizations, surgeries, and ER visits in previous 12 months J.  Vitals K. Screenings such as hearing and vision if needed, cognitive and depression L. Referrals and appointments   In addition, I have reviewed and discussed with patient certain preventive protocols, quality metrics, and best practice recommendations. A written personalized care plan for preventive services as well as general preventive health recommendations were provided to patient. Nurse Health Advisor  Signed,    Iyauna Sing [place], [place]  08/22/2421 Nurse Health Advisor   Nurse Notes: None.

## 2019-03-19 ENCOUNTER — Ambulatory Visit (INDEPENDENT_AMBULATORY_CARE_PROVIDER_SITE_OTHER): Payer: Medicare Other

## 2019-03-19 ENCOUNTER — Other Ambulatory Visit: Payer: Self-pay

## 2019-03-19 DIAGNOSIS — Z0000 Encounter for general adult medical examination without abnormal findings: Secondary | ICD-10-CM

## 2019-03-19 NOTE — Patient Instructions (Signed)
Kerri Ramirez , Thank you for taking time to come for your Medicare Wellness Visit. I appreciate your ongoing commitment to your health goals. Please review the following plan we discussed and let me know if I can assist you in the future.   Screening recommendations/referrals: Colonoscopy: No longer required.  Mammogram: No longer required.  Bone Density: Up to date, due 04/2020 Recommended yearly ophthalmology/optometry visit for glaucoma screening and checkup Recommended yearly dental visit for hygiene and checkup  Vaccinations: Influenza vaccine: Up to date Pneumococcal vaccine: Completed series Tdap vaccine: Pt declines today.  Shingles vaccine: Pt declines today.     Advanced directives: Currently on file.   Conditions/risks identified: Fall risk prevention discussed today. Recommend increasing water intake to 6-8 8 oz glasses a day.   Next appointment: 05/07/19 @ 10:0 AM with Dr Beryle Flock. Declined scheduling an AWV for 2022 at this time.    Preventive Care 40 Years and Older, Female Preventive care refers to lifestyle choices and visits with your health care provider that can promote health and wellness. What does preventive care include?  A yearly physical exam. This is also called an annual well check.  Dental exams once or twice a year.  Routine eye exams. Ask your health care provider how often you should have your eyes checked.  Personal lifestyle choices, including:  Daily care of your teeth and gums.  Regular physical activity.  Eating a healthy diet.  Avoiding tobacco and drug use.  Limiting alcohol use.  Practicing safe sex.  Taking low-dose aspirin every day.  Taking vitamin and mineral supplements as recommended by your health care provider. What happens during an annual well check? The services and screenings done by your health care provider during your annual well check will depend on your age, overall health, lifestyle risk factors, and family  history of disease. Counseling  Your health care provider may ask you questions about your:  Alcohol use.  Tobacco use.  Drug use.  Emotional well-being.  Home and relationship well-being.  Sexual activity.  Eating habits.  History of falls.  Memory and ability to understand (cognition).  Work and work Astronomer.  Reproductive health. Screening  You may have the following tests or measurements:  Height, weight, and BMI.  Blood pressure.  Lipid and cholesterol levels. These may be checked every 5 years, or more frequently if you are over 18 years old.  Skin check.  Lung cancer screening. You may have this screening every year starting at age 75 if you have a 30-pack-year history of smoking and currently smoke or have quit within the past 15 years.  Fecal occult blood test (FOBT) of the stool. You may have this test every year starting at age 44.  Flexible sigmoidoscopy or colonoscopy. You may have a sigmoidoscopy every 5 years or a colonoscopy every 10 years starting at age 41.  Hepatitis C blood test.  Hepatitis B blood test.  Sexually transmitted disease (STD) testing.  Diabetes screening. This is done by checking your blood sugar (glucose) after you have not eaten for a while (fasting). You may have this done every 1-3 years.  Bone density scan. This is done to screen for osteoporosis. You may have this done starting at age 27.  Mammogram. This may be done every 1-2 years. Talk to your health care provider about how often you should have regular mammograms. Talk with your health care provider about your test results, treatment options, and if necessary, the need for more tests. Vaccines  Your health care provider may recommend certain vaccines, such as:  Influenza vaccine. This is recommended every year.  Tetanus, diphtheria, and acellular pertussis (Tdap, Td) vaccine. You may need a Td booster every 10 years.  Zoster vaccine. You may need this after  age 66.  Pneumococcal 13-valent conjugate (PCV13) vaccine. One dose is recommended after age 14.  Pneumococcal polysaccharide (PPSV23) vaccine. One dose is recommended after age 74. Talk to your health care provider about which screenings and vaccines you need and how often you need them. This information is not intended to replace advice given to you by your health care provider. Make sure you discuss any questions you have with your health care provider. Document Released: 02/28/2015 Document Revised: 10/22/2015 Document Reviewed: 12/03/2014 Elsevier Interactive Patient Education  2017 [employer] Prevention in the Home Falls can cause injuries. They can happen to people of all ages. There are many things you can do to make your home safe and to help prevent falls. What can I do on the outside of my home?  Regularly fix the edges of walkways and driveways and fix any cracks.  Remove anything that might make you trip as you walk through a door, such as a raised step or threshold.  Trim any bushes or trees on the path to your home.  Use bright outdoor lighting.  Clear any walking paths of anything that might make someone trip, such as rocks or tools.  Regularly check to see if handrails are loose or broken. Make sure that both sides of any steps have handrails.  Any raised decks and porches should have guardrails on the edges.  Have any leaves, snow, or ice cleared regularly.  Use sand or salt on walking paths during winter.  Clean up any spills in your garage right away. This includes oil or grease spills. What can I do in the bathroom?  Use night lights.  Install grab bars by the toilet and in the tub and shower. Do not use towel bars as grab bars.  Use non-skid mats or decals in the tub or shower.  If you need to sit down in the shower, use a plastic, non-slip stool.  Keep the floor dry. Clean up any water that spills on the floor as soon as it  happens.  Remove soap buildup in the tub or shower regularly.  Attach bath mats securely with double-sided non-slip rug tape.  Do not have throw rugs and other things on the floor that can make you trip. What can I do in the bedroom?  Use night lights.  Make sure that you have a light by your bed that is easy to reach.  Do not use any sheets or blankets that are too big for your bed. They should not hang down onto the floor.  Have a firm chair that has side arms. You can use this for support while you get dressed.  Do not have throw rugs and other things on the floor that can make you trip. What can I do in the kitchen?  Clean up any spills right away.  Avoid walking on wet floors.  Keep items that you use a lot in easy-to-reach places.  If you need to reach something above you, use a strong step stool that has a grab bar.  Keep electrical cords out of the way.  Do not use floor polish or wax that makes floors slippery. If you must use wax, use non-skid floor wax.  Do  not have throw rugs and other things on the floor that can make you trip. What can I do with my stairs?  Do not leave any items on the stairs.  Make sure that there are handrails on both sides of the stairs and use them. Fix handrails that are broken or loose. Make sure that handrails are as long as the stairways.  Check any carpeting to make sure that it is firmly attached to the stairs. Fix any carpet that is loose or worn.  Avoid having throw rugs at the top or bottom of the stairs. If you do have throw rugs, attach them to the floor with carpet tape.  Make sure that you have a light switch at the top of the stairs and the bottom of the stairs. If you do not have them, ask someone to add them for you. What else can I do to help prevent falls?  Wear shoes that:  Do not have high heels.  Have rubber bottoms.  Are comfortable and fit you well.  Are closed at the toe. Do not wear sandals.  If you  use a stepladder:  Make sure that it is fully opened. Do not climb a closed stepladder.  Make sure that both sides of the stepladder are locked into place.  Ask someone to hold it for you, if possible.  Clearly mark and make sure that you can see:  Any grab bars or handrails.  First and last steps.  Where the edge of each step is.  Use tools that help you move around (mobility aids) if they are needed. These include:  Canes.  Walkers.  Scooters.  Crutches.  Turn on the lights when you go into a dark area. Replace any light bulbs as soon as they burn out.  Set up your furniture so you have a clear path. Avoid moving your furniture around.  If any of your floors are uneven, fix them.  If there are any pets around you, be aware of where they are.  Review your medicines with your doctor. Some medicines can make you feel dizzy. This can increase your chance of falling. Ask your doctor what other things that you can do to help prevent falls. This information is not intended to replace advice given to you by your health care provider. Make sure you discuss any questions you have with your health care provider. Document Released: 11/28/2008 Document Revised: 07/10/2015 Document Reviewed: 03/08/2014 Elsevier Interactive Patient Education  2017 [employer].

## 2019-03-22 ENCOUNTER — Other Ambulatory Visit: Payer: Self-pay | Admitting: Family Medicine

## 2019-03-27 ENCOUNTER — Ambulatory Visit: Payer: Medicare Other | Admitting: Gastroenterology

## 2019-04-23 ENCOUNTER — Encounter: Payer: Self-pay | Admitting: Gastroenterology

## 2019-04-23 ENCOUNTER — Other Ambulatory Visit: Payer: Self-pay

## 2019-04-23 ENCOUNTER — Ambulatory Visit (INDEPENDENT_AMBULATORY_CARE_PROVIDER_SITE_OTHER): Payer: Medicare Other | Admitting: Gastroenterology

## 2019-04-23 VITALS — BP 167/69 | HR 103 | Temp 98.2°F | Wt 144.1 lb

## 2019-04-23 DIAGNOSIS — D500 Iron deficiency anemia secondary to blood loss (chronic): Secondary | ICD-10-CM

## 2019-04-23 NOTE — Progress Notes (Signed)
Arlyss Repress, MD [address]  [address]  [place], [place] [address]  Main: [phone]  Fax: [phone]    Gastroenterology Consultation  Referring Provider:     Erasmo Downer, MD Primary Care Physician:  Erasmo Downer, MD Primary Gastroenterologist:  Dr. Arlyss Repress Reason for Consultation:     Iron deficiency anemia        HPI:   Kerri Ramirez is a 86 y.o. female referred by Dr. Beryle Flock, Marzella Schlein, MD  for consultation & management of chronic iron deficiency anemia.  Patient has history of mild dementia, limited mobility but able to take care of basic ADLs, s/p right hemicolectomy on 07/26/2013 secondary to refractory diverticular bleed, Left lower extremity DVT diagnosed in 08/2018 on Eliquis was admitted with severe symptomatic anemia, exertional dyspnea on 11/25/2018.  Patient wass found to have hemoglobin of 6.6 on admission.  No known melena, hematemesis, coffee-ground emesis or rectal bleeding.  Patient lives at home with her husband.  Her daughter is the power of attorney who provided most of the history. Apparently, on chart review patient is found to have chronic iron deficiency anemia dating back to 11/2015 and not on iron replacement therapy according to her daughter.  She also has chronic anemia dating back to 2015.  Her most recent hemoglobin was 7.4 on 01/2017, gradually declined from 8.8 in 08/2018 to 7.4 in 09/2018 to-6.6 on admission.  She was also found to have gradually declining renal function, BUN/creatinine 75/2.91 on admission.  She was managed conservatively on pantoprazole drip, received blood transfusion and responded appropriately.  Her AKI resolved.  Both patient and her daughter choose to defer endoscopic evaluation as she was not actively bleeding and to be managed as outpatient if needed.  Patient's hemoglobin was 7.7 on 10/13.  Improved to 8.5 on 10/19 at her PCPs office.  She is discharged home on Protonix and Eliquis was  discontinued as there was no evidence of lower extremity DVT based on the Dopplers during her admission.  Patient is accompanied by her daughter today.  She reports feeling great.  She denies any GI symptoms today.  She is currently taking oral iron twice daily and reports her stools are dark but not black.  She feels her energy levels are improving.  Follow-up visit 04/23/2019 Patient reports doing well, is accompanied by her daughter.  She denies any black stools, rectal bleeding.  She is taking oral iron every other day with vitamin C.  She tends to forget taking Protonix regularly.  She does not have any GI symptoms today   NSAIDs: None  Antiplts/Anticoagulants/Anti thrombotics: None  GI Procedures: Colonoscopy several years ago  Past Medical History:  Diagnosis Date   Anemia    Dementia (HCC)    DVT (deep vein thrombosis) in pregnancy    Hyperlipidemia    Hypertension     Past Surgical History:  Procedure Laterality Date   ANKLE SURGERY Left 1981   BACK SURGERY  1987   broken vertebra   COLONOSCOPY     Dr Mechele Collin   HEMICOLECTOMY Right 07-25-13   Dr Byrd/diverticulitis   IVC FILTER PLACEMENT ([facility] HX)  09/01/2013   Current Outpatient Medications:    Ascorbic Acid (VITAMIN C) 1000 MG tablet, Take 1,000 mg by mouth daily., Disp: , Rfl:    ferrous sulfate 325 (65 FE) MG tablet, Take 1 tablet (325 mg total) by mouth every other day., Disp: 45 tablet, Rfl: 1   Lactobacillus ([employer]  PROBIOTIC ACIDOPHILUS PO), Take by mouth daily., Disp: , Rfl:    memantine (NAMENDA) 5 MG tablet, Take 1 tablet by mouth twice daily, Disp: 180 tablet, Rfl: 0   Omega-3 Fatty Acids (FISH OIL) 1000 MG CAPS, Take 1,000 mg by mouth daily. , Disp: , Rfl:    pantoprazole (PROTONIX) 40 MG tablet, Take 1 tablet (40 mg total) by mouth 2 (two) times daily., Disp: 180 tablet, Rfl: 1   vitamin B-12 (CYANOCOBALAMIN) 1000 MCG tablet, Take 1,000 mcg by mouth daily. , Disp: , Rfl:    Family History    Problem Relation Age of Onset   Heart attack Father    Heart attack Brother    Heart attack Brother      Social History   Tobacco Use   Smoking status: Former Smoker    Years: 2.00    Types: Cigarettes    Quit date: 02/16/1944    Years since quitting: 75.2   Smokeless tobacco: Never Used  Substance Use Topics   Alcohol use: No   Drug use: No    Allergies as of 04/23/2019 - Review Complete 04/23/2019  Allergen Reaction Noted   Codeine Other (See Comments) 07/02/2013    Review of Systems:    All systems reviewed and negative except where noted in HPI.   Physical Exam:  BP (!) 167/69 (BP Location: Left Arm, Patient Position: Sitting, Cuff Size: Normal)    Pulse (!) 103    Temp 98.2 F (36.8 C) (Oral)    Wt 144 lb 2 oz (65.4 kg)    BMI 24.74 kg/m  No LMP recorded. Patient is postmenopausal.  General:   Alert,  Well-developed, well-nourished, pleasant and cooperative in NAD Head:  Normocephalic and atraumatic. Eyes:  Sclera clear, no icterus.   Conjunctiva pale. Ears: Decreased auditory acuity. Nose:  No deformity, discharge, or lesions. Mouth:  No deformity or lesions,oropharynx pink & moist. Neck:  Supple; no masses or thyromegaly. Lungs:  Respirations even and unlabored.  Clear throughout to auscultation.   No wheezes, crackles, or rhonchi. No acute distress. Heart:  Regular rate and rhythm; no murmurs, clicks, rubs, or gallops. Abdomen:  Normal bowel sounds. Soft, non-tender and non-distended without masses, hepatosplenomegaly or hernias noted.  No guarding or rebound tenderness.   Rectal: Not performed Msk:  Symmetrical without gross deformities. Good, equal movement & strength bilaterally. Pulses:  Normal pulses noted. Extremities:  No clubbing or edema.  No cyanosis. Neurologic:  Alert and oriented x3;  grossly normal neurologically. Skin:  Intact without significant lesions or rashes. No jaundice. Psych:  Alert and cooperative. Normal mood and  affect.  Imaging Studies: Reviewed  Assessment and Plan:   Kerri Ramirez is a 86 y.o. female with history of right hemicolectomy in 2015 secondary to refractory diverticular bleed, mild dementia, left lower extremity DVT previously on Eliquis, chronic iron deficiency anemia.  She does not demonstrate any symptoms of active GI bleed.  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  Patient is responding very well to oral iron replacement, ferritin levels were last normal.  Recheck CBC and ferritin by her PCP at the time of follow-up with her as preferred by patient's daughter  Follow up as needed   Cephas Darby, MD

## 2019-05-07 ENCOUNTER — Other Ambulatory Visit: Payer: Self-pay

## 2019-05-07 ENCOUNTER — Ambulatory Visit (INDEPENDENT_AMBULATORY_CARE_PROVIDER_SITE_OTHER): Payer: Medicare Other | Admitting: Family Medicine

## 2019-05-07 ENCOUNTER — Encounter: Payer: Self-pay | Admitting: Family Medicine

## 2019-05-07 VITALS — BP 134/65 | HR 83 | Temp 96.8°F | Wt 140.0 lb

## 2019-05-07 DIAGNOSIS — I10 Essential (primary) hypertension: Secondary | ICD-10-CM

## 2019-05-07 DIAGNOSIS — E782 Mixed hyperlipidemia: Secondary | ICD-10-CM

## 2019-05-07 DIAGNOSIS — D508 Other iron deficiency anemias: Secondary | ICD-10-CM | POA: Diagnosis not present

## 2019-05-07 DIAGNOSIS — Z86718 Personal history of other venous thrombosis and embolism: Secondary | ICD-10-CM | POA: Diagnosis not present

## 2019-05-07 DIAGNOSIS — F0390 Unspecified dementia without behavioral disturbance: Secondary | ICD-10-CM | POA: Diagnosis not present

## 2019-05-07 DIAGNOSIS — D692 Other nonthrombocytopenic purpura: Secondary | ICD-10-CM

## 2019-05-07 NOTE — Assessment & Plan Note (Signed)
Chronic and stable Did not tolerate Aricept due to diarrhea Continue Namenda at current dose No behavioral concerns

## 2019-05-07 NOTE — Patient Instructions (Signed)
Preventive Care 38 Years and Older, Female Preventive care refers to lifestyle choices and visits with your health care provider that can promote health and wellness. This includes:  A yearly physical exam. This is also called an annual well check.  Regular dental and eye exams.  Immunizations.  Screening for certain conditions.  Healthy lifestyle choices, such as diet and exercise. What can I expect for my preventive care visit? Physical exam Your health care provider will check:  Height and weight. These may be used to calculate body mass index (BMI), which is a measurement that tells if you are at a healthy weight.  Heart rate and blood pressure.  Your skin for abnormal spots. Counseling Your health care provider may ask you questions about:  Alcohol, tobacco, and drug use.  Emotional well-being.  Home and relationship well-being.  Sexual activity.  Eating habits.  History of falls.  Memory and ability to understand (cognition).  Work and work Statistician.  Pregnancy and menstrual history. What immunizations do I need?  Influenza (flu) vaccine  This is recommended every year. Tetanus, diphtheria, and pertussis (Tdap) vaccine  You may need a Td booster every 10 years. Varicella (chickenpox) vaccine  You may need this vaccine if you have not already been vaccinated. Zoster (shingles) vaccine  You may need this after age 33. Pneumococcal conjugate (PCV13) vaccine  One dose is recommended after age 33. Pneumococcal polysaccharide (PPSV23) vaccine  One dose is recommended after age 72. Measles, mumps, and rubella (MMR) vaccine  You may need at least one dose of MMR if you were born in 1957 or later. You may also need a second dose. Meningococcal conjugate (MenACWY) vaccine  You may need this if you have certain conditions. Hepatitis A vaccine  You may need this if you have certain conditions or if you travel or work in places where you may be exposed  to hepatitis A. Hepatitis B vaccine  You may need this if you have certain conditions or if you travel or work in places where you may be exposed to hepatitis B. Haemophilus influenzae type b (Hib) vaccine  You may need this if you have certain conditions. You may receive vaccines as individual doses or as more than one vaccine together in one shot (combination vaccines). Talk with your health care provider about the risks and benefits of combination vaccines. What tests do I need? Blood tests  Lipid and cholesterol levels. These may be checked every 5 years, or more frequently depending on your overall health.  Hepatitis C test.  Hepatitis B test. Screening  Lung cancer screening. You may have this screening every year starting at age 39 if you have a 30-pack-year history of smoking and currently smoke or have quit within the past 15 years.  Colorectal cancer screening. All adults should have this screening starting at age 36 and continuing until age 15. Your health care provider may recommend screening at age 23 if you are at increased risk. You will have tests every 1-10 years, depending on your results and the type of screening test.  Diabetes screening. This is done by checking your blood sugar (glucose) after you have not eaten for a while (fasting). You may have this done every 1-3 years.  Mammogram. This may be done every 1-2 years. Talk with your health care provider about how often you should have regular mammograms.  BRCA-related cancer screening. This may be done if you have a family history of breast, ovarian, tubal, or peritoneal cancers.  Other tests  Sexually transmitted disease (STD) testing.  Bone density scan. This is done to screen for osteoporosis. You may have this done starting at age 44. Follow these instructions at home: Eating and drinking  Eat a diet that includes fresh fruits and vegetables, whole grains, lean protein, and low-fat dairy products. Limit  your intake of foods with high amounts of sugar, saturated fats, and salt.  Take vitamin and mineral supplements as recommended by your health care provider.  Do not drink alcohol if your health care provider tells you not to drink.  If you drink alcohol: ? Limit how much you have to 0-1 drink a day. ? Be aware of how much alcohol is in your drink. In the [place] one drink equals one 12 oz bottle of beer (355 mL), one 5 oz glass of wine (148 mL), or one 1 oz glass of hard liquor (44 mL). Lifestyle  Take daily care of your teeth and gums.  Stay active. Exercise for at least 30 minutes on 5 or more days each week.  Do not use any products that contain nicotine or tobacco, such as cigarettes, e-cigarettes, and chewing tobacco. If you need help quitting, ask your health care provider.  If you are sexually active, practice safe sex. Use a condom or other form of protection in order to prevent STIs (sexually transmitted infections).  Talk with your health care provider about taking a low-dose aspirin or statin. What's next?  Go to your health care provider once a year for a well check visit.  Ask your health care provider how often you should have your eyes and teeth checked.  Stay up to date on all vaccines. This information is not intended to replace advice given to you by your health care provider. Make sure you discuss any questions you have with your health care provider. Document Revised: 01/26/2018 Document Reviewed: 01/26/2018 Elsevier Patient Education  2020 [employer].

## 2019-05-07 NOTE — Progress Notes (Signed)
Patient: Kerri Ramirez Female    DOB: 15-Apr-1925   86 y.o.   MRN: [id number] Visit Date: 05/07/2019  Today's Provider: Lavon Paganini, MD   Chief Complaint  Patient presents with  . Follow-up  . Hypertension   Subjective:     HPI    Hypertension, follow-up:  BP Readings from Last 3 Encounters:  05/07/19 134/65  04/23/19 (!) 167/69  12/19/18 (!) 159/69    She was last seen for hypertension 5 months ago.  BP at that visit was 135/54. Management since that visit includes no chages. She reports excellent compliance with treatment. She is not having side effects.  She is not exercising. She is not adherent to low salt diet.   Outside blood pressures are not being check. She is experiencing none.  Patient denies chest pain, fatigue and palpitations.   Cardiovascular risk factors include advanced age (older than 19 for men, 50 for women).  Use of agents associated with hypertension: none.     Weight trend: stable Wt Readings from Last 3 Encounters:  05/07/19 140 lb (63.5 kg)  04/23/19 144 lb 2 oz (65.4 kg)  12/19/18 144 lb 6.4 oz (65.5 kg)    Current diet: in general, a "healthy" diet    ------------------------------------------------------------------------    Allergies  Allergen Reactions  . Codeine Other (See Comments)    Made head spin      Current Outpatient Medications:  .  Ascorbic Acid (VITAMIN C) 1000 MG tablet, Take 1,000 mg by mouth daily., Disp: , Rfl:  .  Lactobacillus ([employer]S PROBIOTIC ACIDOPHILUS PO), Take by mouth daily., Disp: , Rfl:  .  memantine (NAMENDA) 5 MG tablet, Take 1 tablet by mouth twice daily, Disp: 180 tablet, Rfl: 0 .  Omega-3 Fatty Acids (FISH OIL) 1000 MG CAPS, Take 1,000 mg by mouth daily. , Disp: , Rfl:  .  vitamin B-12 (CYANOCOBALAMIN) 1000 MCG tablet, Take 1,000 mcg by mouth daily. , Disp: , Rfl:  .  ferrous sulfate 325 (65 FE) MG tablet, Take 1 tablet (325 mg total) by mouth every other day. (Patient not  taking: Reported on 05/07/2019), Disp: 45 tablet, Rfl: 1 .  pantoprazole (PROTONIX) 40 MG tablet, Take 1 tablet (40 mg total) by mouth 2 (two) times daily. (Patient not taking: Reported on 05/07/2019), Disp: 180 tablet, Rfl: 1  Review of Systems  Constitutional: Negative.   Respiratory: Negative.   Cardiovascular: Positive for leg swelling. Negative for chest pain and palpitations.  Gastrointestinal: Negative.   Neurological: Negative for dizziness, light-headedness and headaches.    Social History   Tobacco Use  . Smoking status: Former Smoker    Years: 2.00    Types: Cigarettes    Quit date: 02/16/1944    Years since quitting: 75.2  . Smokeless tobacco: Never Used  Substance Use Topics  . Alcohol use: No      Objective:   BP 134/65 (BP Location: Left Arm, Patient Position: Sitting, Cuff Size: Normal)   Pulse 83   Temp (!) 96.8 F (36 C) (Temporal)   Wt 140 lb (63.5 kg)   BMI 24.03 kg/m  Vitals:   05/07/19 1016  BP: 134/65  Pulse: 83  Temp: (!) 96.8 F (36 C)  TempSrc: Temporal  Weight: 140 lb (63.5 kg)  Body mass index is 24.03 kg/m.   Physical Exam Vitals reviewed.  Constitutional:      General: She is not in acute distress.    Appearance: Normal appearance. She  is well-developed. She is not diaphoretic.  HENT:     Head: Normocephalic and atraumatic.     Right Ear: External ear normal.     Left Ear: External ear normal.  Eyes:     General: No scleral icterus.    Conjunctiva/sclera: Conjunctivae normal.     Pupils: Pupils are equal, round, and reactive to light.  Neck:     Thyroid: No thyromegaly.  Cardiovascular:     Rate and Rhythm: Normal rate and regular rhythm.     Pulses: Normal pulses.     Heart sounds: Normal heart sounds. No murmur.  Pulmonary:     Effort: Pulmonary effort is normal. No respiratory distress.     Breath sounds: Normal breath sounds. No wheezing or rales.  Abdominal:     General: Bowel sounds are normal. There is no distension.      Palpations: Abdomen is soft.     Tenderness: There is no abdominal tenderness. There is no guarding or rebound.  Musculoskeletal:        General: No deformity.     Cervical back: Neck supple.     Right lower leg: Edema present.     Left lower leg: Edema present.  Lymphadenopathy:     Cervical: No cervical adenopathy.  Skin:    General: Skin is warm and dry.     Capillary Refill: Capillary refill takes less than 2 seconds.     Findings: No rash.  Neurological:     Mental Status: She is alert and oriented to person, place, and time. Mental status is at baseline.  Psychiatric:        Mood and Affect: Mood normal.        Behavior: Behavior normal.        Thought Content: Thought content normal.      No results found for any visits on 05/07/19.     Assessment & Plan    Problem List Items Addressed This Visit      Cardiovascular and Mediastinum   Essential hypertension - Primary    Well controlled Continue current medications Recheck metabolic panel F/u in 6 months       Relevant Orders   Comprehensive metabolic panel   Senile purpura (HCC)    Chronic and stable Reassurance given        Nervous and Auditory   Dementia (HCC)    Chronic and stable Did not tolerate Aricept due to diarrhea Continue Namenda at current dose No behavioral concerns        Other   HLD (hyperlipidemia)    Not currently on a statin Recheck lipid panel Given age, patient is understandably hesitant to consider statin for primary prevention, which I agree with      Relevant Orders   Comprehensive metabolic panel   Lipid panel   Iron deficiency anemia    No signs of GI bleed Studies performed by GI Status post transfusion in hospital and IV iron Thought to be poor uptake related to history of hemicolectomy No longer on oral iron supplement Recheck CBC and iron panel      Relevant Orders   CBC   Fe+TIBC+Fer   History of recurrent deep vein thrombosis (DVT)    No longer on  anticoagulation Bleeding risk factors outweigh benefit from long-term anticoagulation Continue to monitor for signs of recurrent DVT          Return in about 6 months (around 11/07/2019) for chronic disease f/u.   The entirety of  the information documented in the History of Present Illness, Review of Systems and Physical Exam were personally obtained by me. Portions of this information were initially documented by Ashley Royalty, CMA and reviewed by me for thoroughness and accuracy.    Shrita Thien, Dionne Bucy, MD MPH [facility]

## 2019-05-07 NOTE — Assessment & Plan Note (Signed)
Not currently on a statin Recheck lipid panel Given age, patient is understandably hesitant to consider statin for primary prevention, which I agree with

## 2019-05-07 NOTE — Assessment & Plan Note (Signed)
No longer on anticoagulation Bleeding risk factors outweigh benefit from long-term anticoagulation Continue to monitor for signs of recurrent DVT

## 2019-05-07 NOTE — Assessment & Plan Note (Signed)
Well controlled Continue current medications Recheck metabolic panel F/u in 6 months  

## 2019-05-07 NOTE — Assessment & Plan Note (Signed)
Chronic and stable Reassurance given

## 2019-05-07 NOTE — Assessment & Plan Note (Signed)
No signs of GI bleed Studies performed by GI Status post transfusion in hospital and IV iron Thought to be poor uptake related to history of hemicolectomy No longer on oral iron supplement Recheck CBC and iron panel

## 2019-05-08 ENCOUNTER — Telehealth: Payer: Self-pay

## 2019-05-08 LAB — COMPREHENSIVE METABOLIC PANEL
ALT: 10 IU/L (ref 0–32)
AST: 16 IU/L (ref 0–40)
Albumin/Globulin Ratio: 2.3 — ABNORMAL HIGH (ref 1.2–2.2)
Albumin: 4.1 g/dL (ref 3.5–4.6)
Alkaline Phosphatase: 71 IU/L (ref 39–117)
BUN/Creatinine Ratio: 30 — ABNORMAL HIGH (ref 12–28)
BUN: 32 mg/dL (ref 10–36)
Bilirubin Total: 0.5 mg/dL (ref 0.0–1.2)
CO2: 17 mmol/L — ABNORMAL LOW (ref 20–29)
Calcium: 9.7 mg/dL (ref 8.7–10.3)
Chloride: 111 mmol/L — ABNORMAL HIGH (ref 96–106)
Creatinine, Ser: 1.08 mg/dL — ABNORMAL HIGH (ref 0.57–1.00)
GFR calc Af Amer: 52 mL/min/{1.73_m2} — ABNORMAL LOW (ref >59)
GFR calc non Af Amer: 45 mL/min/{1.73_m2} — ABNORMAL LOW (ref >59)
Globulin, Total: 1.8 g/dL (ref 1.5–4.5)
Glucose: 87 mg/dL (ref 65–99)
Potassium: 4.4 mmol/L (ref 3.5–5.2)
Sodium: 142 mmol/L (ref 134–144)
Total Protein: 5.9 g/dL — ABNORMAL LOW (ref 6.0–8.5)

## 2019-05-08 LAB — CBC
Hematocrit: 32.4 % — ABNORMAL LOW (ref 34.0–46.6)
Hemoglobin: 11.1 g/dL (ref 11.1–15.9)
MCH: 31.6 pg (ref 26.6–33.0)
MCHC: 34.3 g/dL (ref 31.5–35.7)
MCV: 92 fL (ref 79–97)
Platelets: 278 10*3/uL (ref 150–450)
RBC: 3.51 x10E6/uL — ABNORMAL LOW (ref 3.77–5.28)
RDW: 13.1 % (ref 11.7–15.4)
WBC: 7.8 10*3/uL (ref 3.4–10.8)

## 2019-05-08 LAB — LIPID PANEL
Chol/HDL Ratio: 2.7 ratio (ref 0.0–4.4)
Cholesterol, Total: 175 mg/dL (ref 100–199)
HDL: 65 mg/dL (ref >39)
LDL Chol Calc (NIH): 91 mg/dL (ref 0–99)
Triglycerides: 104 mg/dL (ref 0–149)
VLDL Cholesterol Cal: 19 mg/dL (ref 5–40)

## 2019-05-08 LAB — IRON,TIBC AND FERRITIN PANEL
Ferritin: 44 ng/mL (ref 15–150)
Iron Saturation: 22 % (ref 15–55)
Iron: 62 ug/dL (ref 27–139)
Total Iron Binding Capacity: 278 ug/dL (ref 250–450)
UIBC: 216 ug/dL (ref 118–369)

## 2019-05-08 NOTE — Telephone Encounter (Signed)
Daughter, Toribio Harbour, returned call re: pt's lab results.  Advised of result note per Dr. Beryle Flock from today.  Daughter verb. understanding.  Denied any questions.

## 2019-05-08 NOTE — Telephone Encounter (Signed)
LMTCB 05/08/2019.  PEC please advise pt (or daughter Toribio Harbour) of lab results below.    Thanks,   -Vernona Rieger

## 2019-05-08 NOTE — Telephone Encounter (Signed)
-----   Message from Erasmo Downer, MD sent at 05/08/2019  8:25 AM EDT ----- Hemoglobin and iron are back to normal.  Also with improvement in kidney function

## 2019-06-25 ENCOUNTER — Other Ambulatory Visit: Payer: Self-pay | Admitting: Family Medicine

## 2019-07-20 ENCOUNTER — Emergency Department
Admission: EM | Admit: 2019-07-20 | Discharge: 2019-08-16 | Disposition: E | Payer: Medicare Other | Attending: Emergency Medicine | Admitting: Emergency Medicine

## 2019-07-20 ENCOUNTER — Telehealth: Payer: Self-pay

## 2019-07-20 DIAGNOSIS — I469 Cardiac arrest, cause unspecified: Secondary | ICD-10-CM

## 2019-07-20 DIAGNOSIS — I10 Essential (primary) hypertension: Secondary | ICD-10-CM | POA: Diagnosis not present

## 2019-07-20 DIAGNOSIS — F0390 Unspecified dementia without behavioral disturbance: Secondary | ICD-10-CM | POA: Insufficient documentation

## 2019-07-20 DIAGNOSIS — R404 Transient alteration of awareness: Secondary | ICD-10-CM | POA: Diagnosis not present

## 2019-07-20 DIAGNOSIS — R0602 Shortness of breath: Secondary | ICD-10-CM | POA: Diagnosis not present

## 2019-07-20 DIAGNOSIS — E785 Hyperlipidemia, unspecified: Secondary | ICD-10-CM | POA: Diagnosis not present

## 2019-07-20 DIAGNOSIS — R069 Unspecified abnormalities of breathing: Secondary | ICD-10-CM | POA: Diagnosis not present

## 2019-07-20 DIAGNOSIS — R0689 Other abnormalities of breathing: Secondary | ICD-10-CM | POA: Diagnosis not present

## 2019-07-20 DIAGNOSIS — E1165 Type 2 diabetes mellitus with hyperglycemia: Secondary | ICD-10-CM | POA: Diagnosis not present

## 2019-07-20 MED ORDER — EPINEPHRINE 1 MG/10ML IJ SOSY
PREFILLED_SYRINGE | INTRAMUSCULAR | Status: AC | PRN
  Administered 2019-07-20 (×3): 1 mg via INTRAVENOUS

## 2019-07-20 NOTE — ED Notes (Signed)
Stephens Shire [phone] granddaughter present with pts husband

## 2019-07-20 NOTE — Code Documentation (Signed)
90 and decreasing at pulse check

## 2019-07-20 NOTE — Progress Notes (Signed)
Ch arrived at room in response to [facility] to ED. Upon arrival to room Pt had passed away already after CPR attempt; as per RN. Ch let RN know to contact if family will need support in the future.

## 2019-07-20 NOTE — ED Provider Notes (Signed)
[facility] Emergency Department Provider Note   ____________________________________________   I have reviewed the triage vital signs and the nursing notes.   HISTORY  Chief Complaint Cardiopulmonary arrest  History limited by and level 5 caveat due to: Cardiopulmonary arrest   HPI Kerri Ramirez is a 86 y.o. female who presents to the emergency department today via EMS as emergency traffic.  EMS states that they were called to her house for a sick call.  When they got there and as they were preparing to transport her the patient became unresponsive and they lost pulses.  They were able to shock the initial rhythm and in fact administered 3 shocks.  They also then did 3 rounds of epinephrine.  The patient's rhythm at one point was asystolic as well as PEA.  Blood sugar was elevated for EMS.   Records reviewed. Per medical record review patient has a history of dementia, HLD, HTN.  Past Medical History:  Diagnosis Date  . Anemia   . Dementia (HCC)   . DVT (deep vein thrombosis) in pregnancy   . Hyperlipidemia   . Hypertension     Patient Active Problem List   Diagnosis Date Noted  . History of recurrent deep vein thrombosis (DVT) 05/07/2019  . Senile purpura (HCC) 11/01/2017  . Hypernatremia 11/01/2017  . Fracture of metacarpal bone 09/07/2016  . Ventral hernia without obstruction or gangrene 05/09/2015  . Dementia (HCC) 04/28/2015  . Acid reflux 10/29/2014  . Gout 10/29/2014  . 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 HLD (hyperlipidemia) 10/29/2014  . Essential hypertension 10/29/2014  . Iron deficiency anemia 10/29/2014  . Colitis, ischemic (HCC) 10/29/2014  . LBP (low back pain) 10/29/2014  . Adiposity 10/29/2014  . OP (osteoporosis) 10/29/2014  . Diarrhea 10/29/2014  . Telogen effluvium 10/29/2014  . Tubular adenoma of colon 11/22/1997    Past Surgical History:  Procedure Laterality Date  . ANKLE SURGERY Left 1981  . BACK SURGERY  1987    broken vertebra  . COLONOSCOPY     Dr Mechele Collin  . HEMICOLECTOMY Right 07-25-13   Dr Byrd/diverticulitis  . IVC FILTER PLACEMENT ([facility] HX)  09/01/2013    Prior to Admission medications   Medication Sig Start Date End Date Taking? Authorizing Provider  Ascorbic Acid (VITAMIN C) 1000 MG tablet Take 1,000 mg by mouth daily.    [provider]  ferrous sulfate 325 (65 FE) MG tablet Take 1 tablet (325 mg total) by mouth every other day. Patient not taking: Reported on 05/07/2019 12/26/18 06/24/19  Erasmo Downer, MD  Lactobacillus (CVS PROBIOTIC ACIDOPHILUS PO) Take by mouth daily.    [provider]  memantine (NAMENDA) 5 MG tablet Take 1 tablet by mouth twice daily 06/25/19   Bacigalupo, Marzella Schlein, MD  Omega-3 Fatty Acids (FISH OIL) 1000 MG CAPS Take 1,000 mg by mouth daily.     [provider]  pantoprazole (PROTONIX) 40 MG tablet Take 1 tablet (40 mg total) by mouth 2 (two) times daily. Patient not taking: Reported on 05/07/2019 12/26/18   Erasmo Downer, MD  vitamin B-12 (CYANOCOBALAMIN) 1000 MCG tablet Take 1,000 mcg by mouth daily.     [provider]    Allergies Codeine  Family History  Problem Relation Age of Onset  . Heart attack Father   . Heart attack Brother   . Heart attack Brother     Social History Social History   Tobacco Use  . Smoking status: Former Smoker  Years: 2.00    Types: Cigarettes    Quit date: 02/16/1944    Years since quitting: 75.4  . Smokeless tobacco: Never Used  Substance Use Topics  . Alcohol use: No  . Drug use: No    Review of Systems Unable to obtain ____________________________________________   PHYSICAL EXAM:  VITAL SIGNS: ED Triage Vitals [08/13/2019 2057]  Enc Vitals Group     BP      Pulse Rate (!) 0     Resp      Temp      Temp src      SpO2      Weight      Height      Head Circumference      Peak Flow      Pain Score      Pain Loc      Pain Edu?      Excl. in [place]?       Constitutional: Unresponsive Eyes: Conjunctivae are normal.  ENT      Head: Normocephalic and atraumatic.      Nose: No congestion/rhinnorhea.      Mouth/Throat: King airway in place.      Neck: No stridor. Cardiovascular: Linton Rump device administering CPR Respiratory: King airway in place. Bagging easily. Gastrointestinal: Soft Genitourinary: Deferred Musculoskeletal: No lower extremity edema. Neurologic:  Unresponsive ____________________________________________    LABS (pertinent positives/negatives)  None  ____________________________________________   EKG  None  ____________________________________________    RADIOLOGY  None  ____________________________________________   PROCEDURES  Procedures  Cardiopulmonary Resuscitation (CPR) Procedure Note Directed/Performed by: Nance Pear I personally directed ancillary staff and/or performed CPR in an effort to regain return of spontaneous circulation and to maintain cardiac, neuro and systemic perfusion.   CRITICAL CARE Performed by: Nance Pear   Total critical care time: 20 minutes  Critical care time was exclusive of separately billable procedures and treating other patients.  Critical care was necessary to treat or prevent imminent or life-threatening deterioration.  Critical care was time spent personally by me on the following activities: development of treatment plan with patient and/or surrogate as well as nursing, discussions with consultants, evaluation of patient's response to treatment, examination of patient, obtaining history from patient or surrogate, ordering and performing treatments and interventions, ordering and review of laboratory studies, ordering and review of radiographic studies, pulse oximetry and re-evaluation of patient's condition.  ____________________________________________   INITIAL IMPRESSION / ASSESSMENT AND PLAN / ED COURSE  Pertinent labs & imaging results  that were available during my care of the patient were reviewed by me and considered in my medical decision making (see chart for details).   Patient presented to the emergency department as emergency traffic.  EMS was initially called out for sick person however the patient coded as they are preparing her for transport.  They performed CPR for 30 minutes prior to arrival without any return of pulse.  On my exam patient does have King airway in place.  Multiple rounds of CPR were performed here as well.  Initial rhythm here was PEA.  No pulse was felt.  Bedside ultrasound did not show any organized cardiac activity.  Patient was pronounced dead.  ____________________________________________   FINAL CLINICAL IMPRESSION(S) / ED DIAGNOSES  Final diagnoses:  Cardiopulmonary arrest [facility])     Note: This dictation was prepared with Dragon dictation. Any transcriptional errors that result from this process are unintentional     Nance Pear, MD August 19, 2019 1559

## 2019-07-20 NOTE — Telephone Encounter (Signed)
Copied from CRM #[id number]. Topic: General - Other >> August 19, 2019  4:48 PM Daphine Deutscher D wrote: Reason for CRM: Pt's daughter called saying mom has a place on her arm that looks infected.  Mom will not go to the doctor and daughter is out of state visiting family.  Daughter Rudean Hitt) said she had this happen before and she sent a picture to the office and Dr. B prescribed an antibiotic and ordered a home health nurse.  She understands that it will probably be Monday before Dr. B can do anything.  Daughter will be home tomorrow to check on it.  I told daughter that if it looks infected she may need to take her to the urgent care or ER depending on how bad it is.  CB# [phone]

## 2019-07-20 NOTE — ED Notes (Signed)
At 2059 pronounced by EDMD and POA son contacted. He will get in touch with other family and send someone to the home for pts husband

## 2019-07-20 NOTE — ED Notes (Signed)
Pt arrived via EMS from home with ACLS in progress. EMS state they arrived to home and husband said pt was having a heart attack. Pt coded and went into VTach with EMS present. ACLS per EMS with 3 rounds of Epi, Amniodarone, fluid bolus and defibrillation for VTach. Heart rhythm per EMS Vtach to PEA to asystole and back and forth several times. Glucose 316. Pt arrives with Lucus in place and ventilations with [facility] Airway. Pt has R tibia IO for access. Pt arrived in room at 2046 and transferred immediately to bed and monitors. Report given and code continues in progess.

## 2019-07-23 NOTE — Telephone Encounter (Signed)
Per ED note, patient now deceased.

## 2019-08-16 DEATH — deceased

## 2019-11-12 ENCOUNTER — Ambulatory Visit: Payer: Medicare Other | Admitting: Family Medicine
